# Patient Record
Sex: Female | Born: 1970 | Race: Black or African American | Hispanic: No | Marital: Married | Smoking: Never smoker
Health system: Southern US, Community
[De-identification: ages and names within clinical notes are randomized; demographics above are authoritative.]

---

## 2014-02-21 ENCOUNTER — Inpatient Hospital Stay (HOSPITAL_COMMUNITY): Payer: 59

## 2014-02-21 ENCOUNTER — Encounter (HOSPITAL_COMMUNITY): Payer: Self-pay | Admitting: Emergency Medicine

## 2014-02-21 ENCOUNTER — Emergency Department (HOSPITAL_COMMUNITY): Payer: 59

## 2014-02-21 ENCOUNTER — Inpatient Hospital Stay (HOSPITAL_COMMUNITY)
Admission: EM | Admit: 2014-02-21 | Discharge: 2014-03-21 | DRG: 004 | Disposition: A | Discharge status: Other Acute Inpatient Hospital | Payer: 59 | Attending: Pulmonary Disease | Admitting: Pulmonary Disease

## 2014-02-21 DIAGNOSIS — E872 Acidosis: Secondary | ICD-10-CM | POA: Diagnosis present

## 2014-02-21 DIAGNOSIS — R4182 Altered mental status, unspecified: Secondary | ICD-10-CM

## 2014-02-21 DIAGNOSIS — I672 Cerebral atherosclerosis: Secondary | ICD-10-CM | POA: Diagnosis present

## 2014-02-21 DIAGNOSIS — I610 Nontraumatic intracerebral hemorrhage in hemisphere, subcortical: Principal | ICD-10-CM

## 2014-02-21 DIAGNOSIS — H5702 Anisocoria: Secondary | ICD-10-CM

## 2014-02-21 DIAGNOSIS — Z992 Dependence on renal dialysis: Secondary | ICD-10-CM

## 2014-02-21 DIAGNOSIS — G936 Cerebral edema: Secondary | ICD-10-CM | POA: Diagnosis present

## 2014-02-21 DIAGNOSIS — I612 Nontraumatic intracerebral hemorrhage in hemisphere, unspecified: Secondary | ICD-10-CM

## 2014-02-21 DIAGNOSIS — J9601 Acute respiratory failure with hypoxia: Secondary | ICD-10-CM | POA: Diagnosis present

## 2014-02-21 DIAGNOSIS — R1310 Dysphagia, unspecified: Secondary | ICD-10-CM | POA: Diagnosis present

## 2014-02-21 DIAGNOSIS — F419 Anxiety disorder, unspecified: Secondary | ICD-10-CM | POA: Diagnosis present

## 2014-02-21 DIAGNOSIS — D62 Acute posthemorrhagic anemia: Secondary | ICD-10-CM | POA: Diagnosis present

## 2014-02-21 DIAGNOSIS — Z930 Tracheostomy status: Secondary | ICD-10-CM

## 2014-02-21 DIAGNOSIS — E46 Unspecified protein-calorie malnutrition: Secondary | ICD-10-CM | POA: Diagnosis present

## 2014-02-21 DIAGNOSIS — J398 Other specified diseases of upper respiratory tract: Secondary | ICD-10-CM

## 2014-02-21 DIAGNOSIS — I69398 Other sequelae of cerebral infarction: Secondary | ICD-10-CM

## 2014-02-21 DIAGNOSIS — T17990A Other foreign object in respiratory tract, part unspecified in causing asphyxiation, initial encounter: Secondary | ICD-10-CM | POA: Diagnosis not present

## 2014-02-21 DIAGNOSIS — Z9911 Dependence on respirator [ventilator] status: Secondary | ICD-10-CM | POA: Diagnosis not present

## 2014-02-21 DIAGNOSIS — I272 Other secondary pulmonary hypertension: Secondary | ICD-10-CM | POA: Diagnosis present

## 2014-02-21 DIAGNOSIS — I63519 Cerebral infarction due to unspecified occlusion or stenosis of unspecified middle cerebral artery: Secondary | ICD-10-CM | POA: Diagnosis present

## 2014-02-21 DIAGNOSIS — G8190 Hemiplegia, unspecified affecting unspecified side: Secondary | ICD-10-CM | POA: Diagnosis present

## 2014-02-21 DIAGNOSIS — Z515 Encounter for palliative care: Secondary | ICD-10-CM

## 2014-02-21 DIAGNOSIS — E1165 Type 2 diabetes mellitus with hyperglycemia: Secondary | ICD-10-CM | POA: Diagnosis present

## 2014-02-21 DIAGNOSIS — G9340 Encephalopathy, unspecified: Secondary | ICD-10-CM

## 2014-02-21 DIAGNOSIS — I501 Left ventricular failure: Secondary | ICD-10-CM

## 2014-02-21 DIAGNOSIS — J189 Pneumonia, unspecified organism: Secondary | ICD-10-CM | POA: Diagnosis present

## 2014-02-21 DIAGNOSIS — Z9289 Personal history of other medical treatment: Secondary | ICD-10-CM

## 2014-02-21 DIAGNOSIS — J9690 Respiratory failure, unspecified, unspecified whether with hypoxia or hypercapnia: Secondary | ICD-10-CM

## 2014-02-21 DIAGNOSIS — R4701 Aphasia: Secondary | ICD-10-CM | POA: Diagnosis present

## 2014-02-21 DIAGNOSIS — I161 Hypertensive emergency: Secondary | ICD-10-CM

## 2014-02-21 DIAGNOSIS — Z95828 Presence of other vascular implants and grafts: Secondary | ICD-10-CM

## 2014-02-21 DIAGNOSIS — N186 End stage renal disease: Secondary | ICD-10-CM | POA: Diagnosis present

## 2014-02-21 DIAGNOSIS — I120 Hypertensive chronic kidney disease with stage 5 chronic kidney disease or end stage renal disease: Secondary | ICD-10-CM | POA: Diagnosis present

## 2014-02-21 DIAGNOSIS — Z23 Encounter for immunization: Secondary | ICD-10-CM | POA: Diagnosis not present

## 2014-02-21 DIAGNOSIS — Z6841 Body Mass Index (BMI) 40.0 and over, adult: Secondary | ICD-10-CM

## 2014-02-21 DIAGNOSIS — N179 Acute kidney failure, unspecified: Secondary | ICD-10-CM | POA: Diagnosis present

## 2014-02-21 DIAGNOSIS — I677 Cerebral arteritis, not elsewhere classified: Secondary | ICD-10-CM

## 2014-02-21 DIAGNOSIS — I619 Nontraumatic intracerebral hemorrhage, unspecified: Secondary | ICD-10-CM | POA: Diagnosis present

## 2014-02-21 DIAGNOSIS — J811 Chronic pulmonary edema: Secondary | ICD-10-CM

## 2014-02-21 DIAGNOSIS — R4020 Unspecified coma: Secondary | ICD-10-CM

## 2014-02-21 DIAGNOSIS — R403 Persistent vegetative state: Secondary | ICD-10-CM | POA: Diagnosis present

## 2014-02-21 DIAGNOSIS — R531 Weakness: Secondary | ICD-10-CM

## 2014-02-21 DIAGNOSIS — I639 Cerebral infarction, unspecified: Secondary | ICD-10-CM | POA: Diagnosis present

## 2014-02-21 DIAGNOSIS — I5030 Unspecified diastolic (congestive) heart failure: Secondary | ICD-10-CM | POA: Diagnosis present

## 2014-02-21 DIAGNOSIS — J810 Acute pulmonary edema: Secondary | ICD-10-CM

## 2014-02-21 DIAGNOSIS — R509 Fever, unspecified: Secondary | ICD-10-CM

## 2014-02-21 DIAGNOSIS — Z4659 Encounter for fitting and adjustment of other gastrointestinal appliance and device: Secondary | ICD-10-CM

## 2014-02-21 DIAGNOSIS — R400 Somnolence: Secondary | ICD-10-CM

## 2014-02-21 LAB — COMPREHENSIVE METABOLIC PANEL
ALBUMIN: 3.7 g/dL (ref 3.5–5.2)
ALK PHOS: 152 U/L — AB (ref 39–117)
ALT: 30 U/L (ref 0–35)
AST: 25 U/L (ref 0–37)
Anion gap: 19 — ABNORMAL HIGH (ref 5–15)
BILIRUBIN TOTAL: 0.9 mg/dL (ref 0.3–1.2)
BUN: 42 mg/dL — ABNORMAL HIGH (ref 6–23)
CHLORIDE: 101 meq/L (ref 96–112)
CO2: 17 meq/L — AB (ref 19–32)
Calcium: 9.3 mg/dL (ref 8.4–10.5)
Creatinine, Ser: 3.58 mg/dL — ABNORMAL HIGH (ref 0.50–1.10)
GFR calc Af Amer: 17 mL/min — ABNORMAL LOW (ref >90)
GFR calc non Af Amer: 15 mL/min — ABNORMAL LOW (ref >90)
Glucose, Bld: 131 mg/dL — ABNORMAL HIGH (ref 70–99)
POTASSIUM: 3.6 meq/L — AB (ref 3.7–5.3)
Sodium: 137 mEq/L (ref 137–147)
Total Protein: 7.9 g/dL (ref 6.0–8.3)

## 2014-02-21 LAB — CBC WITH DIFFERENTIAL/PLATELET
BASOS ABS: 0.1 10*3/uL (ref 0.0–0.1)
BASOS PCT: 0 % (ref 0–1)
Eosinophils Absolute: 0.1 10*3/uL (ref 0.0–0.7)
Eosinophils Relative: 1 % (ref 0–5)
HCT: 35.2 % — ABNORMAL LOW (ref 36.0–46.0)
HEMOGLOBIN: 11.4 g/dL — AB (ref 12.0–15.0)
LYMPHS PCT: 9 % — AB (ref 12–46)
Lymphs Abs: 1.3 10*3/uL (ref 0.7–4.0)
MCH: 27.5 pg (ref 26.0–34.0)
MCHC: 32.4 g/dL (ref 30.0–36.0)
MCV: 85 fL (ref 78.0–100.0)
MONO ABS: 0.8 10*3/uL (ref 0.1–1.0)
MONOS PCT: 6 % (ref 3–12)
NEUTROS PCT: 84 % — AB (ref 43–77)
Neutro Abs: 12.4 10*3/uL — ABNORMAL HIGH (ref 1.7–7.7)
Platelets: 329 10*3/uL (ref 150–400)
RBC: 4.14 MIL/uL (ref 3.87–5.11)
RDW: 14.9 % (ref 11.5–15.5)
WBC: 14.6 10*3/uL — ABNORMAL HIGH (ref 4.0–10.5)

## 2014-02-21 LAB — POCT I-STAT 3, ART BLOOD GAS (G3+)
ACID-BASE DEFICIT: 8 mmol/L — AB (ref 0.0–2.0)
BICARBONATE: 17.8 meq/L — AB (ref 20.0–24.0)
O2 Saturation: 86 %
TCO2: 19 mmol/L (ref 0–100)
pCO2 arterial: 37.2 mmHg (ref 35.0–45.0)
pH, Arterial: 7.288 — ABNORMAL LOW (ref 7.350–7.450)
pO2, Arterial: 57 mmHg — ABNORMAL LOW (ref 80.0–100.0)

## 2014-02-21 LAB — PROTIME-INR
INR: 1.14 (ref 0.00–1.49)
Prothrombin Time: 14.6 seconds (ref 11.6–15.2)

## 2014-02-21 LAB — TROPONIN I: Troponin I: <0.3 ng/mL (ref <0.30)

## 2014-02-21 LAB — GLUCOSE, CAPILLARY: Glucose-Capillary: 166 mg/dL — ABNORMAL HIGH (ref 70–99)

## 2014-02-21 LAB — PRO B NATRIURETIC PEPTIDE: PRO B NATRI PEPTIDE: 36455 pg/mL — AB (ref 0–125)

## 2014-02-21 MED ORDER — NICARDIPINE HCL IN NACL 20-0.86 MG/200ML-% IV SOLN
5.0000 mg/h | Freq: Once | INTRAVENOUS | Status: AC
  Administered 2014-02-21: 5 mg/h via INTRAVENOUS
  Filled 2014-02-21: qty 200

## 2014-02-21 MED ORDER — FUROSEMIDE 10 MG/ML IJ SOLN
80.0000 mg | Freq: Once | INTRAMUSCULAR | Status: AC
  Administered 2014-02-21: 80 mg via INTRAVENOUS
  Filled 2014-02-21: qty 8

## 2014-02-21 MED ORDER — SENNOSIDES-DOCUSATE SODIUM 8.6-50 MG PO TABS
1.0000 | ORAL_TABLET | Freq: Two times a day (BID) | ORAL | Status: DC
  Administered 2014-02-23 (×2): 1 via ORAL
  Filled 2014-02-21 (×7): qty 1

## 2014-02-21 MED ORDER — INSULIN ASPART 100 UNIT/ML ~~LOC~~ SOLN
0.0000 [IU] | SUBCUTANEOUS | Status: DC
  Administered 2014-02-22: 3 [IU] via SUBCUTANEOUS
  Administered 2014-02-22 – 2014-02-25 (×5): 2 [IU] via SUBCUTANEOUS
  Administered 2014-02-25: 3 [IU] via SUBCUTANEOUS
  Administered 2014-02-25 (×3): 2 [IU] via SUBCUTANEOUS
  Administered 2014-02-26: 3 [IU] via SUBCUTANEOUS
  Administered 2014-02-26: 2 [IU] via SUBCUTANEOUS
  Administered 2014-02-26: 3 [IU] via SUBCUTANEOUS
  Administered 2014-02-26: 2 [IU] via SUBCUTANEOUS
  Administered 2014-02-26: 3 [IU] via SUBCUTANEOUS
  Administered 2014-02-26: 2 [IU] via SUBCUTANEOUS
  Administered 2014-02-27: 3 [IU] via SUBCUTANEOUS
  Administered 2014-02-27: 2 [IU] via SUBCUTANEOUS
  Administered 2014-02-27: 3 [IU] via SUBCUTANEOUS
  Administered 2014-02-27: 2 [IU] via SUBCUTANEOUS
  Administered 2014-02-28: 3 [IU] via SUBCUTANEOUS
  Administered 2014-02-28: 2 [IU] via SUBCUTANEOUS
  Administered 2014-02-28: 3 [IU] via SUBCUTANEOUS
  Administered 2014-02-28: 2 [IU] via SUBCUTANEOUS
  Administered 2014-02-28: 3 [IU] via SUBCUTANEOUS
  Administered 2014-02-28 – 2014-03-01 (×4): 2 [IU] via SUBCUTANEOUS
  Administered 2014-03-01: 3 [IU] via SUBCUTANEOUS
  Administered 2014-03-01 – 2014-03-02 (×3): 2 [IU] via SUBCUTANEOUS
  Administered 2014-03-02: 3 [IU] via SUBCUTANEOUS
  Administered 2014-03-02 – 2014-03-11 (×23): 2 [IU] via SUBCUTANEOUS
  Administered 2014-03-12: 3 [IU] via SUBCUTANEOUS
  Administered 2014-03-13 (×4): 2 [IU] via SUBCUTANEOUS
  Administered 2014-03-13: 3 [IU] via SUBCUTANEOUS
  Administered 2014-03-16 – 2014-03-17 (×5): 2 [IU] via SUBCUTANEOUS
  Administered 2014-03-18: 1 [IU] via SUBCUTANEOUS
  Administered 2014-03-18 – 2014-03-20 (×6): 2 [IU] via SUBCUTANEOUS

## 2014-02-21 MED ORDER — PANTOPRAZOLE SODIUM 40 MG IV SOLR
40.0000 mg | Freq: Every day | INTRAVENOUS | Status: DC
  Administered 2014-02-21 – 2014-02-23 (×3): 40 mg via INTRAVENOUS
  Filled 2014-02-21 (×5): qty 40

## 2014-02-21 MED ORDER — FENTANYL CITRATE 0.05 MG/ML IJ SOLN
50.0000 ug | INTRAMUSCULAR | Status: DC | PRN
  Administered 2014-02-21 – 2014-02-23 (×3): 50 ug via INTRAVENOUS
  Filled 2014-02-21 (×3): qty 2

## 2014-02-21 MED ORDER — FENTANYL CITRATE 0.05 MG/ML IJ SOLN
INTRAMUSCULAR | Status: AC
  Administered 2014-02-22: 100 ug via INTRAVENOUS
  Filled 2014-02-21: qty 2

## 2014-02-21 MED ORDER — LORAZEPAM 2 MG/ML IJ SOLN
0.5000 mg | Freq: Once | INTRAMUSCULAR | Status: AC
  Administered 2014-02-21: 0.5 mg via INTRAVENOUS

## 2014-02-21 MED ORDER — FUROSEMIDE 10 MG/ML IJ SOLN
80.0000 mg | Freq: Once | INTRAMUSCULAR | Status: AC
  Administered 2014-02-21: 80 mg via INTRAVENOUS

## 2014-02-21 MED ORDER — PROPOFOL 10 MG/ML IV EMUL
0.0000 ug/kg/min | INTRAVENOUS | Status: AC
  Administered 2014-02-22 (×2): 10 ug/kg/min via INTRAVENOUS
  Administered 2014-02-22 – 2014-02-23 (×3): 20 ug/kg/min via INTRAVENOUS
  Filled 2014-02-21 (×5): qty 100

## 2014-02-21 MED ORDER — NITROGLYCERIN 2 % TD OINT
1.0000 [in_us] | TOPICAL_OINTMENT | Freq: Once | TRANSDERMAL | Status: AC
Start: 2014-02-21 — End: 2014-02-21
  Administered 2014-02-21: 1 [in_us] via TOPICAL
  Filled 2014-02-21: qty 1

## 2014-02-21 MED ORDER — ACETAMINOPHEN 325 MG PO TABS: 650.0000 mg | ORAL_TABLET | ORAL | Status: DC | PRN

## 2014-02-21 MED ORDER — NICARDIPINE HCL IN NACL 20-0.86 MG/200ML-% IV SOLN
3.0000 mg/h | INTRAVENOUS | Status: DC
  Administered 2014-02-21: 7.5 mg/h via INTRAVENOUS
  Filled 2014-02-21: qty 200

## 2014-02-21 MED ORDER — ALBUTEROL SULFATE (2.5 MG/3ML) 0.083% IN NEBU: 2.5000 mg | INHALATION_SOLUTION | Freq: Once | RESPIRATORY_TRACT | Status: DC

## 2014-02-21 MED ORDER — IPRATROPIUM-ALBUTEROL 0.5-2.5 (3) MG/3ML IN SOLN: 3.0000 mL | Freq: Once | RESPIRATORY_TRACT | Status: DC

## 2014-02-21 MED ORDER — FUROSEMIDE 10 MG/ML IJ SOLN
80.0000 mg | Freq: Once | INTRAMUSCULAR | Status: DC
  Filled 2014-02-21: qty 8

## 2014-02-21 MED ORDER — DEXTROSE 5 % IV SOLN
160.0000 mg | Freq: Three times a day (TID) | INTRAVENOUS | Status: DC
  Administered 2014-02-22 (×2): 160 mg via INTRAVENOUS
  Filled 2014-02-21 (×4): qty 16

## 2014-02-21 MED ORDER — ACETAMINOPHEN 650 MG RE SUPP: 650.0000 mg | RECTAL | Status: DC | PRN

## 2014-02-21 MED ORDER — MIDAZOLAM HCL 2 MG/2ML IJ SOLN
INTRAMUSCULAR | Status: AC
  Administered 2014-02-22: 2 mg
  Filled 2014-02-21: qty 2

## 2014-02-21 MED ORDER — LORAZEPAM 2 MG/ML IJ SOLN
INTRAMUSCULAR | Status: AC
  Filled 2014-02-21: qty 1

## 2014-02-21 MED ORDER — STROKE: EARLY STAGES OF RECOVERY BOOK
Freq: Once | Status: AC
  Administered 2014-02-21: 22:00:00
  Filled 2014-02-21: qty 1

## 2014-02-21 MED ORDER — SODIUM CHLORIDE 0.9 % IV SOLN: INTRAVENOUS | Status: DC

## 2014-02-21 MED ORDER — LORAZEPAM 2 MG/ML IJ SOLN
INTRAMUSCULAR | Status: AC
  Administered 2014-02-21: 0.5 mg via INTRAVENOUS
  Filled 2014-02-21: qty 1

## 2014-02-21 NOTE — Procedures (Signed)
Intubation Procedure Note Lynn Gentry [id number] 1971/04/09  Procedure: Intubation Indications: Respiratory insufficiency  Procedure Details Consent: Unable to obtain consent because of altered level of consciousness. and EMERGENCY Time Out: Verified patient identification, verified procedure, site/side was marked, verified correct patient position, special equipment/implants available, medications/allergies/relevent history reviewed, required imaging and test results available.  Performed  Maximum sterile technique was used including cap, gloves, gown, hand hygiene and mask.  MAC    Evaluation Hemodynamic Status: BP stable throughout; O2 sats: peristently between 78 and 85% due to pulmonary edema Patient's Current Condition: stable Complications: No apparent complications Patient did tolerate procedure well. Chest X-ray ordered to verify placement.  CXR: pending.   Lynn Gentry 02/21/2014   Procedure done by PA Celine Mans with my supervision at bedside. She has small mouth and is morbidly obese with acute resp failure due to pulm edema. Despiet nasal trumpet and bagging with sedation, pulse ox never went abuve 89%. She needed paralytic to be able to open her mouth . Intubated under glidescope. Copious pulmonary edema +   Dr. Kalman Shan, M.D., [facility].C.P Pulmonary and Critical Care Medicine Staff Physician [facility] Pager: [phone], If no answer or between  15:00h - 7:00h: call [phone]  02/21/2014 10:52 PM

## 2014-02-21 NOTE — ED Provider Notes (Signed)
CSN: [id number]     Arrival date & time 02/21/14  1757 History  This chart was scribed for Benny Lennert, MD by Bronson Curb, ED Scribe. This patient was seen in room APA05/APA05 and the patient's care was started at 6:01 PM.    Chief Complaint  Patient presents with  . Cerebrovascular Accident      Patient is a 43 y.o. female presenting with Acute Neurological Problem. The history is provided by the patient. No language interpreter was used.  Cerebrovascular Accident This is a new problem. The current episode started 3 to 5 hours ago. The problem has been gradually improving. Pertinent negatives include no chest pain, no abdominal pain, no headaches and no shortness of breath. Nothing aggravates the symptoms. Nothing relieves the symptoms. She has tried nothing for the symptoms.    HPI Comments: Lynn Gentry is a 43 y.o. female brought in by ambulance, who presents to the Emergency Department for CVA that occurred 3 hours ago. Per EMS patient was asymptomatic prior to onset and states she was found by her son at approximately 1600, where she was unable to stand up from chair. EMS reports associated left sided weakness, slurred speech, along with a BP of 200/100 (current BP 226/111). Patient denies any pain at present. Patient is not established with a PCP, and has no history of significant medical conditions.     No past medical history on file. No past surgical history on file. No family history on file. History  Substance Use Topics  . Smoking status: Not on file  . Smokeless tobacco: Not on file  . Alcohol Use: Not on file   OB History   No data available     Review of Systems  Constitutional: Negative for appetite change and fatigue.  HENT: Negative for congestion, ear discharge and sinus pressure.   Eyes: Negative for discharge.  Respiratory: Negative for cough and shortness of breath.   Cardiovascular: Negative for chest pain.  Gastrointestinal: Negative for  abdominal pain and diarrhea.  Genitourinary: Negative for frequency and hematuria.  Musculoskeletal: Negative for back pain.  Skin: Negative for rash.  Neurological: Positive for weakness (left sided). Negative for seizures and headaches.  Psychiatric/Behavioral: Negative for hallucinations.      Allergies  Review of patient's allergies indicates not on file.  Home Medications   Prior to Admission medications   Not on File   BP 226/111  Pulse 113  Resp 28  SpO2 94% Physical Exam  Constitutional: She is oriented to person, place, and time. She appears well-developed.  HENT:  Head: Normocephalic.  Eyes: Conjunctivae and EOM are normal. No scleral icterus.  Neck: Neck supple. No thyromegaly present.  Cardiovascular: Normal rate and regular rhythm.  Exam reveals no gallop and no friction rub.   No murmur heard. Pulmonary/Chest: No stridor. She has no wheezes. She has no rales. She exhibits no tenderness.  Abdominal: She exhibits no distension. There is no tenderness. There is no rebound.  Musculoskeletal: Normal range of motion. She exhibits no edema.  Lymphadenopathy:    She has no cervical adenopathy.  Neurological: She is oriented to person, place, and time. She exhibits normal muscle tone. Coordination normal.  Severe arm left arm and left leg weakness with left facial droop.  Skin: No rash noted. No erythema.  Psychiatric: She has a normal mood and affect. Her behavior is normal.    ED Course  Procedures (including critical care time)  DIAGNOSTIC STUDIES: Oxygen Saturation is 94% on  [place], adequate by my interpretation.    COORDINATION OF CARE: At 1805 Discussed treatment plan with patient which includes CT scan of head, CMP, and nicardipine. Patient agrees.   Labs Review Labs Reviewed - No data to display  Imaging Review Ct Head Wo Contrast  02/21/2014   CLINICAL DATA:  Patient found unresponsive at home by her son at approximately 1600 hr this afternoon. Code  stroke.  EXAM: CT HEAD WITHOUT CONTRAST  TECHNIQUE: Contiguous axial images were obtained from the base of the skull through the vertex without intravenous contrast.  COMPARISON:  None.  FINDINGS: Oval-shaped acute hemorrhage in the lateral right basal ganglia measuring approximately 4.1 x 1.5 cm (image 15). Mild surrounding edema, extending upward into the posterior right frontal lobe. Mild mass effect with shift of the midline to the left approximating 5 mm. No acute hemorrhage or hematoma elsewhere. No evidence of hydrocephalus. No other focal brain parenchymal abnormalities.  No skull fracture or other focal osseous abnormality involving the skull. Visualized paranasal sinuses, bilateral mastoid air cells and bilateral middle ear cavities well-aerated.  IMPRESSION: 1. Acute hemorrhage into the lateral right basal ganglia, likely indicating a hypertensive hemorrhage/hemorrhagic stroke. 2. Mild mass effect with shift of the midline to the left approximating 5 mm. Critical Value/emergent results were called by telephone at the time of interpretation on 02/21/2014 at 6:27 pm to Dr. Bethann BerkshireJOSEPH Miyah Hampshire , who verbally acknowledged these results.   Electronically Signed   By: Hulan Saashomas  Lawrence M.D.   On: 02/21/2014 18:29      EKG Interpretation None     CRITICAL CARE Performed by: Carnita Golob L Total critical care time: 40 Critical care time was exclusive of separately billable procedures and treating other patients. Critical care was necessary to treat or prevent imminent or life-threatening deterioration. Critical care was time spent personally by me on the following activities: development of treatment plan with patient and/or surrogate as well as nursing, discussions with consultants, evaluation of patient's response to treatment, examination of patient, obtaining history from patient or surrogate, ordering and performing treatments and interventions, ordering and review of laboratory studies, ordering and  review of radiographic studies, pulse oximetry and re-evaluation of patient's condition.   spoke with neurology who will accept pt to [facility]   Final diagnoses:  None    The chart was scribed for me under my direct supervision.  I personally performed the history, physical, and medical decision making and all procedures in the evaluation of this patient.Benny Lennert.   Lacey Wallman L Carolos Fecher, MD 02/21/14 [phone]

## 2014-02-21 NOTE — Consult Note (Signed)
PULMONARY / CRITICAL CARE MEDICINE   Name: Lynn Gentry MRN: [id number] DOB: February 11, 1971    ADMISSION DATE:  02/21/2014 CONSULTATION DATE:  02/21/2014  REFERRING MD :  Lynn Gentry  CHIEF COMPLAINT:  Respiratory failure in setting of ICH  INITIAL PRESENTATION: 43 y.o. F brought to [facility] ED on 10/6 with left sided weakness, slurred speech, inability to stand up.  In ED, found to have acute lateral right basal ganglia ICH with mild mass effect and 5mm MLS.  She was admitted to neuro ICU and later that evening had increasing O2 demands.  She had no improvement in SpO2, work of breathing, and mental status despite 3 hours of BiPAP.  PCCM was consulted for intubation.   STUDIES:  CT Head10/6 >>> acute hemorrhage into the lateral right basal ganglia, likely indicating a hypertensive hemorrhage/hemorrhagic stroke.  Mild mass effect with 5mm MLS to the left.  SIGNIFICANT EVENTS: 10/6 - admitted for ICH, developed respiratory failure requiring intubation.   HISTORY OF PRESENT ILLNESS:  Pt is encephalopathic; therefore, this HPI is obtained from chart review. Lynn Gentry is a 43 y.o. F with unknown PMH, brought to [facility] ED via EMS on 10/6 after her son found her at home unable to stand up, with left sided weakness, and slurred speech.  She was seen in ED roughly 3 hours later and was admitted to neuro ICU for medical management.  She was initially placed on [place], then NRB, and eventually BiPAP.  After 3 hours of BiPAP, respiratory status continued to decline.  PCCM was consulted for intubation.for acute respiratory failure 02/21/2014    PAST MEDICAL HISTORY :  History reviewed. No pertinent past medical history. History reviewed. No pertinent past surgical history. Prior to Admission medications   Medication Sig Start Date End Date Taking? Authorizing Provider  cetirizine (ZYRTEC) 10 MG tablet Take 10 mg by mouth daily as needed for allergies.   Yes Historical Provider, MD   No Known Allergies  FAMILY  HISTORY:  No family history on file. SOCIAL HISTORY:  reports that she has never smoked. She does not have any smokeless tobacco history on file. She reports that she drinks alcohol. She reports that she does not use illicit drugs.  REVIEW OF SYSTEMS:  Unable to obtain as pt is encephalopathic.    VITAL SIGNS: Pulse Rate:  [113-121] 115 (10/06 2130) Resp:  [25-49] 32 (10/06 2130) BP: (140-226)/(69-111) 150/82 mmHg (10/06 2130) SpO2:  [87 %-100 %] 96 % (10/06 2130) FiO2 (%):  [100 %] 100 % (10/06 2130) HEMODYNAMICS:   VENTILATOR SETTINGS: Vent Mode:  [-] BIPAP FiO2 (%):  [100 %] 100 % INTAKE / OUTPUT: Intake/Output     10/06 0701 - 10/07 0700   Urine 175   Total Output 175   Net -175         PHYSICAL EXAMINATION: General: Obese female, in severe respi distress and critically ill looking Neuro: Does not answer questions or follow commands. Obtunded (per RN was oriented earlier and alert) HEENT: [place]/AT. PERRL, sclerae anicteric. BiPAP in place. Cardiovascular: RRR, no M/R/G.  Lungs: Respirations shallow and labored, paradoxical, severe resp disterss, crackles throughout.  On BiPAP. Abdomen: BS hypoactive, soft, NT/ND.  Musculoskeletal: No gross deformities, 1+edema.  Skin: Intact, warm, no rashes.  LABS: PULMONARY  Recent Labs Lab 02/21/14 2151  PHART 7.288*  PCO2ART 37.2  PO2ART 57.0*  HCO3 17.8*  TCO2 19  O2SAT 86.0    CBC  Recent Labs Lab 02/21/14 1900  HGB 11.4*  HCT 35.2*  WBC 14.6*  PLT 329    COAGULATION  Recent Labs Lab 02/21/14 1900  INR 1.14    CARDIAC   Recent Labs Lab 02/21/14 1900  TROPONINI <0.30    Recent Labs Lab 02/21/14 1900  PROBNP 36455.0*     CHEMISTRY  Recent Labs Lab 02/21/14 1900  NA 137  K 3.6*  CL 101  CO2 17*  GLUCOSE 131*  BUN 42*  CREATININE 3.58*  CALCIUM 9.3   Estimated Creatinine Clearance: 26.5 ml/min (by C-G formula based on Cr of 3.58).   LIVER  Recent Labs Lab 02/21/14 1900   AST 25  ALT 30  ALKPHOS 152*  BILITOT 0.9  PROT 7.9  ALBUMIN 3.7  INR 1.14     INFECTIOUS No results found for this basename: LATICACIDVEN, PROCALCITON,  in the last 168 hours   ENDOCRINE CBG (last 3)  No results found for this basename: GLUCAP,  in the last 72 hours       IMAGING x48h Ct Head Wo Contrast  02/21/2014   CLINICAL DATA:  Patient found unresponsive at home by her son at approximately 1600 hr this afternoon. Code stroke.  EXAM: CT HEAD WITHOUT CONTRAST  TECHNIQUE: Contiguous axial images were obtained from the base of the skull through the vertex without intravenous contrast.  COMPARISON:  None.  FINDINGS: Oval-shaped acute hemorrhage in the lateral right basal ganglia measuring approximately 4.1 x 1.5 cm (image 15). Mild surrounding edema, extending upward into the posterior right frontal lobe. Mild mass effect with shift of the midline to the left approximating 5 mm. No acute hemorrhage or hematoma elsewhere. No evidence of hydrocephalus. No other focal brain parenchymal abnormalities.  No skull fracture or other focal osseous abnormality involving the skull. Visualized paranasal sinuses, bilateral mastoid air cells and bilateral middle ear cavities well-aerated.  IMPRESSION: 1. Acute hemorrhage into the lateral right basal ganglia, likely indicating a hypertensive hemorrhage/hemorrhagic stroke. 2. Mild mass effect with shift of the midline to the left approximating 5 mm. Critical Value/emergent results were called by telephone at the time of interpretation on 02/21/2014 at 6:27 pm to Dr. Bethann Gentry , who verbally acknowledged these results.   Electronically Signed   By: Lynn Gentry M.D.   On: 02/21/2014 18:29   Dg Chest Portable 1 View  02/21/2014   CLINICAL DATA:  Cerebrovascular accident beginning 3-4 hr ago. Confusion.  EXAM: PORTABLE CHEST - 1 VIEW  COMPARISON:  None.  FINDINGS: Exam is limited by body habitus and underpenetration.  The heart is mildly  enlarged. Asymmetric airspace process, right greater than left. Possible asymmetric pulmonary edema without definite pleural effusions.  IMPRESSION: Mild cardiac enlargement and possible asymmetric pulmonary edema. However, exam is limited by body habitus.   Electronically Signed   By: Loralie Champagne M.D.   On: 02/21/2014 19:38      ASSESSMENT / PLAN:  PULMONARY OETT 10/6 >>> A: Acute hypoxemic respiratory failure with severe acute pulmonary edema - in setting of right lateral basal ganglia ICH  - very hypoxemic    P:   Intubate stat Full mechanical support, wean as able. VAP bundle. SBT in AM. diurese. ABG and CXR in AM.  CARDIOVASCULAR CVL R IJ 10/6 >>> A:  Admitted with severe hypertension > 220 - 0n cardene gtt  Severe pulmonary edema - BNP 36K - Acute systolic v diastolic chf P:  Aggressive lasix Goal MAP > 65 but < 160 sbp; likely Wean off cardene post intubation with diprivan on board Goal  CVP 8 - 12. Trend troponin / lactate. TTE.   RENAL A:   Acute renal failure - baseline creat uknown  P:   BMP in AM. Check renal us Check UA   GASTROINTESTINAL A:   Intubated  P:   SUP: Pantoprazole. NPO. TF if remains NPO > 24 hours; consulted nutrition  HEMATOLOGIC A:   At risk for anemia of critical illness  P:  SCD's in setting of IC Hge. No heparin CBC in AM.  INFECTIOUS A:   No evidence of infection P:   mopnitor without abx  ENDOCRINE A:   At risk for hyperglycemia    P:   CBG's q4hr. SSI.  NEUROLOGIC A:   Intracranial hemorrhage with enceophaloapathy, ? Deterioration of mental status just prior to intubation  P:   Repeat head CT stat Sedation:  diprivan RASS goal: 0 to -1. Daily WUA.  FAMILY Family updates: No family at bedside  IDT family conference: N/a   TODAY'S SUMMARY: ICHge due to high bP. Also with pulm edem and renal failure resulting in intubation. Very hypoxemic. Note entirely done by Staff MD though was opened by  PA    The patient is critically ill with multiple organ systems failure and requires high complexity decision making for assessment and support, frequent evaluation and titration of therapies, application of advanced monitoring technologies and extensive interpretation of multiple databases.   Critical Care Time devoted to patient care services described in this note is  45  Minutes.  Dr. Kalman ShanMurali Dray Dente, M.D., [facility].C.P Pulmonary and Critical Care Medicine Staff Physician [facility] Pager: [phone], If no answer or between  15:00h - 7:00h: call [phone]  02/21/2014 11:15 PM

## 2014-02-21 NOTE — ED Notes (Signed)
Per EMS and spouse. States pt work today until 1500. States she was found by her son at 65 when he arrived home.

## 2014-02-21 NOTE — H&P (Signed)
Admission H&P    Chief Complaint: Acute onset this weakness and slurred speech.  HPI: Mattia Osterman is an 43 y.o. female with no previously documented medical disorder who was brought to [facility] with new onset slurred speech and left-sided weakness. Patient was last known well around 3 PM this afternoon. CT scan of her head showed an acute 4.1 x 1.5 cm right basal ganglia hemorrhage with mild mass effect and right to left shift of 5 mm. Patient's blood pressure was markedly elevated at 226/111. She was started on Cardene drip and blood pressure responded well. She has shown continual respiratory difficulty. She was initially given oxygen by nasal cannula followed by NBR, and subsequently placed on BiPAP. She continued to have oxygen saturations only in the high 80s to low 90s on 100% O2, with a respiratory rates in the 40s. ABG showed pH of 2.82, PCO2 37 PO2 of 57. Chest x-ray showed mild cardiomegaly with possible asymmetric pulmonary edema. BNP was 36,455. Because of deteriorating respiratory status intubation and mechanical ventilation was elected. CCM service was consulted.  LSN: 3 PM on 02/21/2014 tPA Given: No: Acute ICH MRankin:    History reviewed. No pertinent past medical history.  History reviewed. No pertinent past surgical history.  No family history on file. Social History:  reports that she has never smoked. She does not have any smokeless tobacco history on file. She reports that she drinks alcohol. She reports that she does not use illicit drugs.  Allergies: No Known Allergies  Medications Prior to Admission  Medication Sig Dispense Refill  . cetirizine (ZYRTEC) 10 MG tablet Take 10 mg by mouth daily as needed for allergies.        ROS: History obtained from unobtainable from patient due to mental status   Physical Examination: Blood pressure 150/82, pulse 115, resp. rate 32, height _0  (1.626 m), SpO2 96.00%.  Gen. appearance: Markedly obese young to  early middle-aged appearing lady in respiratory distress with tachypnea as well as moderately agitated and confused. HEENT-  Normocephalic, no lesions, without obvious abnormality.  Normal external eye and conjunctiva.  Normal TM's bilaterally.  Normal auditory canals and external ears. Normal external nose, mucus membranes and septum.  Normal pharynx. Neck supple with no masses, nodes, nodules or enlargement. Cardiovascular - tachycardic with rate of 114, regular rhythm, normal S1 and S2. Lungs - moderate diffuse rhonchi bilaterally gallop, with tachypnea of 40-45 breaths per minute Abdomen - soft, non-tender; bowel sounds normal; no masses,  no organomegaly Extremities - no joint deformities, effusion, or inflammation, no edema and no skin discoloration  Neurologic Examination: Patient was on BiPAP and slightly agitated, having been given Ativan prior to this evaluation. She had no clear verbal responses. She only infrequently followed simple commands. She was clearly in significant respiratory distress with tachypnea. Pupils were equal and reacted normally to light. Extraocular movements were intact with right left lateral gaze voluntarily. No clear facial weakness was noted. Patient moved right extremities readily with significantly reduced movements of left extremities. She had no clear purposeful movements other than attempting to remove lines and tubes. Deep tendon reflexes were 1+ and symmetrical. Plantar responses were mute bilaterally. Sensory testing responses were unreliable. Coordination could not be adequately assessed because patient mental status.  Results for orders placed during the hospital encounter of 02/21/14 (from the past 48 hour(s))  CBC WITH DIFFERENTIAL     Status: Abnormal   Collection Time    02/21/14  7:00 PM  Result Value Ref Range   WBC 14.6 (*) 4.0 - 10.5 K/uL   RBC 4.14  3.87 - 5.11 MIL/uL   Hemoglobin 11.4 (*) 12.0 - 15.0 g/dL   HCT 35.2 (*) 36.0 -  46.0 %   MCV 85.0  78.0 - 100.0 fL   MCH 27.5  26.0 - 34.0 pg   MCHC 32.4  30.0 - 36.0 g/dL   RDW 14.9  11.5 - 15.5 %   Platelets 329  150 - 400 K/uL   Neutrophils Relative % 84 (*) 43 - 77 %   Neutro Abs 12.4 (*) 1.7 - 7.7 K/uL   Lymphocytes Relative 9 (*) 12 - 46 %   Lymphs Abs 1.3  0.7 - 4.0 K/uL   Monocytes Relative 6  3 - 12 %   Monocytes Absolute 0.8  0.1 - 1.0 K/uL   Eosinophils Relative 1  0 - 5 %   Eosinophils Absolute 0.1  0.0 - 0.7 K/uL   Basophils Relative 0  0 - 1 %   Basophils Absolute 0.1  0.0 - 0.1 K/uL  COMPREHENSIVE METABOLIC PANEL     Status: Abnormal   Collection Time    02/21/14  7:00 PM      Result Value Ref Range   Sodium 137  137 - 147 mEq/L   Potassium 3.6 (*) 3.7 - 5.3 mEq/L   Chloride 101  96 - 112 mEq/L   CO2 17 (*) 19 - 32 mEq/L   Glucose, Bld 131 (*) 70 - 99 mg/dL   BUN 42 (*) 6 - 23 mg/dL   Creatinine, Ser 3.58 (*) 0.50 - 1.10 mg/dL   Calcium 9.3  8.4 - 10.5 mg/dL   Total Protein 7.9  6.0 - 8.3 g/dL   Albumin 3.7  3.5 - 5.2 g/dL   AST 25  0 - 37 U/L   ALT 30  0 - 35 U/L   Alkaline Phosphatase 152 (*) 39 - 117 U/L   Total Bilirubin 0.9  0.3 - 1.2 mg/dL   GFR calc non Af Amer 15 (*) >90 mL/min   GFR calc Af Amer 17 (*) >90 mL/min   Comment: (NOTE)     The eGFR has been calculated using the CKD EPI equation.     This calculation has not been validated in all clinical situations.     eGFR's persistently <90 mL/min signify possible Chronic Kidney     Disease.   Anion gap 19 (*) 5 - 15  PROTIME-INR     Status: None   Collection Time    02/21/14  7:00 PM      Result Value Ref Range   Prothrombin Time 14.6  11.6 - 15.2 seconds   INR 1.14  0.00 - 1.49  TROPONIN I     Status: None   Collection Time    02/21/14  7:00 PM      Result Value Ref Range   Troponin I <0.30  <0.30 ng/mL   Comment:            Due to the release kinetics of cTnI,     a negative result within the first hours     of the onset of symptoms does not rule out      myocardial infarction with certainty.     If myocardial infarction is still suspected,     repeat the test at appropriate intervals.  PRO B NATRIURETIC PEPTIDE     Status: Abnormal   Collection Time  02/21/14  7:00 PM      Result Value Ref Range   Pro B Natriuretic peptide (BNP) 36455.0 (*) 0 - 125 pg/mL  POCT I-STAT 3, ART BLOOD GAS (G3+)     Status: Abnormal   Collection Time    02/21/14  9:51 PM      Result Value Ref Range   pH, Arterial 7.288 (*) 7.350 - 7.450   pCO2 arterial 37.2  35.0 - 45.0 mmHg   pO2, Arterial 57.0 (*) 80.0 - 100.0 mmHg   Bicarbonate 17.8 (*) 20.0 - 24.0 mEq/L   TCO2 19  0 - 100 mmol/L   O2 Saturation 86.0     Acid-base deficit 8.0 (*) 0.0 - 2.0 mmol/L   Patient temperature 98.3 F     Collection site RADIAL, ALLEN'S TEST ACCEPTABLE     Drawn by RT     Sample type ARTERIAL     Ct Head Wo Contrast  02/21/2014   CLINICAL DATA:  Patient found unresponsive at home by her son at approximately 1600 hr this afternoon. Code stroke.  EXAM: CT HEAD WITHOUT CONTRAST  TECHNIQUE: Contiguous axial images were obtained from the base of the skull through the vertex without intravenous contrast.  COMPARISON:  None.  FINDINGS: Oval-shaped acute hemorrhage in the lateral right basal ganglia measuring approximately 4.1 x 1.5 cm (image 15). Mild surrounding edema, extending upward into the posterior right frontal lobe. Mild mass effect with shift of the midline to the left approximating 5 mm. No acute hemorrhage or hematoma elsewhere. No evidence of hydrocephalus. No other focal brain parenchymal abnormalities.  No skull fracture or other focal osseous abnormality involving the skull. Visualized paranasal sinuses, bilateral mastoid air cells and bilateral middle ear cavities well-aerated.  IMPRESSION: 1. Acute hemorrhage into the lateral right basal ganglia, likely indicating a hypertensive hemorrhage/hemorrhagic stroke. 2. Mild mass effect with shift of the midline to the left  approximating 5 mm. Critical Value/emergent results were called by telephone at the time of interpretation on 02/21/2014 at 6:27 pm to Dr. Milton Ferguson , who verbally acknowledged these results.   Electronically Signed   By: Evangeline Dakin M.D.   On: 02/21/2014 18:29   Dg Chest Portable 1 View  02/21/2014   CLINICAL DATA:  Cerebrovascular accident beginning 3-4 hr ago. Confusion.  EXAM: PORTABLE CHEST - 1 VIEW  COMPARISON:  None.  FINDINGS: Exam is limited by body habitus and underpenetration.  The heart is mildly enlarged. Asymmetric airspace process, right greater than left. Possible asymmetric pulmonary edema without definite pleural effusions.  IMPRESSION: Mild cardiac enlargement and possible asymmetric pulmonary edema. However, exam is limited by body habitus.   Electronically Signed   By: Kalman Jewels M.D.   On: 02/21/2014 19:38    Assessment: 42 y.o. female presenting with hypertensive emergency and acute right intracerebral hemorrhage, as well as acute progressive respiratory failure requiring intubation at this point.  Stroke Risk Factors - hypertension  Plan: 1. HgbA1c, fasting lipid panel 2. MRI, MRA  of the brain without contrast 3. PT consult, OT consult, Speech consult 4. Echocardiogram 5. Carotid dopplers 6. Prophylactic therapy-Nonettt 7. Risk factor modification 8. Telemetry monitoring  This patient was evaluated and managed for multiple acute medical problems, including ICH, hypertensive emergency and respiratory failure, in an ICU setting. Total critical care time was 90 minutes.   C.R. Nicole Kindred, MD [facility] [phone]  02/21/2014, 10:18 PM

## 2014-02-21 NOTE — Procedures (Signed)
Staff note   - personally supervised procedure @ bedside. Real time 2D ultrasound used for vein site selection, patency assessment, and needle entry. A record of image was made but could not be submitted for filing due to malfunction of printing device   Dr. Kalman Shan, M.D., [facility].C.P Pulmonary and Critical Care Medicine Staff Physician [facility]  Pulmonary and Critical Care Pager: [phone], If no answer or between  15:00h - 7:00h: call [phone]  02/21/2014 11:21 PM

## 2014-02-21 NOTE — ED Notes (Signed)
Pt sats 87% on 6L [place], pt switched to NRB at 15L.  sats now 90%

## 2014-02-21 NOTE — ED Notes (Signed)
Verbal orders were just  Given for a Code Stroke. Paged CT.

## 2014-02-21 NOTE — ED Notes (Signed)
Called [facility] as requested by Dr. Estell Harpin, instead of [facility].

## 2014-02-21 NOTE — Procedures (Signed)
Central Venous Catheter Insertion Procedure Note Lynn Gentry [id number] 20-Jun-1970  Procedure: Insertion of Central Venous Catheter Indications: Assessment of intravascular volume, Drug and/or fluid administration and Frequent blood sampling  Procedure Details Consent: Unable to obtain consent because of altered level of consciousness. Time Out: Verified patient identification, verified procedure, site/side was marked, verified correct patient position, special equipment/implants available, medications/allergies/relevent history reviewed, required imaging and test results available.  Performed  Maximum sterile technique was used including antiseptics, cap, gloves, gown, hand hygiene, mask and sheet. Skin prep: Chlorhexidine; local anesthetic administered A antimicrobial bonded/coated triple lumen catheter was placed in the right internal jugular vein using the Seldinger technique.  Evaluation Blood flow good Complications: No apparent complications Patient did tolerate procedure well. Chest X-ray ordered to verify placement.  CXR: pending.  Procedure performed under direct ultrasound guidance for real time vessel cannulation.      Rutherford Guys, PA - C [place] [facility]monary & Critical Care Medicine Pgr: [phone]  or [phone]

## 2014-02-22 ENCOUNTER — Inpatient Hospital Stay (HOSPITAL_COMMUNITY): Payer: 59

## 2014-02-22 ENCOUNTER — Encounter (HOSPITAL_COMMUNITY): Payer: Self-pay | Admitting: Radiology

## 2014-02-22 DIAGNOSIS — I10 Essential (primary) hypertension: Secondary | ICD-10-CM

## 2014-02-22 DIAGNOSIS — I161 Hypertensive emergency: Secondary | ICD-10-CM

## 2014-02-22 DIAGNOSIS — I319 Disease of pericardium, unspecified: Secondary | ICD-10-CM

## 2014-02-22 LAB — BLOOD GAS, ARTERIAL
Acid-base deficit: 8.8 mmol/L — ABNORMAL HIGH (ref 0.0–2.0)
Acid-base deficit: 7.6 mmol/L — ABNORMAL HIGH (ref 0.0–2.0)
BICARBONATE: 16.9 meq/L — AB (ref 20.0–24.0)
BICARBONATE: 17.5 meq/L — AB (ref 20.0–24.0)
DRAWN BY: 331761
Drawn by: 252031
FIO2: 1 %
FIO2: 0.9 %
LHR: 18 {breaths}/min
MECHVT: 440 mL
O2 SAT: 99.1 %
O2 Saturation: 96.8 %
PEEP/CPAP: 14 cmH2O
PEEP/CPAP: 5 cmH2O
PH ART: 7.265 — AB (ref 7.350–7.450)
PH ART: 7.316 — AB (ref 7.350–7.450)
PO2 ART: 151 mmHg — AB (ref 80.0–100.0)
Patient temperature: 98.6
Patient temperature: 97.8
RATE: 18 resp/min
TCO2: 18.1 mmol/L (ref 0–100)
TCO2: 18.6 mmol/L (ref 0–100)
VT: 440 mL
pCO2 arterial: 38.6 mmHg (ref 35.0–45.0)
pCO2 arterial: 35.1 mmHg (ref 35.0–45.0)
pO2, Arterial: 110 mmHg — ABNORMAL HIGH (ref 80.0–100.0)

## 2014-02-22 LAB — BASIC METABOLIC PANEL
Anion gap: 17 — ABNORMAL HIGH (ref 5–15)
BUN: 43 mg/dL — AB (ref 6–23)
CO2: 20 mEq/L (ref 19–32)
CREATININE: 4.14 mg/dL — AB (ref 0.50–1.10)
Calcium: 8.5 mg/dL (ref 8.4–10.5)
Chloride: 103 mEq/L (ref 96–112)
GFR calc Af Amer: 14 mL/min — ABNORMAL LOW (ref >90)
GFR calc non Af Amer: 12 mL/min — ABNORMAL LOW (ref >90)
Glucose, Bld: 160 mg/dL — ABNORMAL HIGH (ref 70–99)
Potassium: 4 mEq/L (ref 3.7–5.3)
Sodium: 140 mEq/L (ref 137–147)

## 2014-02-22 LAB — GLUCOSE, CAPILLARY
GLUCOSE-CAPILLARY: 112 mg/dL — AB (ref 70–99)
GLUCOSE-CAPILLARY: 125 mg/dL — AB (ref 70–99)
GLUCOSE-CAPILLARY: 96 mg/dL (ref 70–99)
Glucose-Capillary: 107 mg/dL — ABNORMAL HIGH (ref 70–99)
Glucose-Capillary: 113 mg/dL — ABNORMAL HIGH (ref 70–99)
Glucose-Capillary: 130 mg/dL — ABNORMAL HIGH (ref 70–99)

## 2014-02-22 LAB — URINE MICROSCOPIC-ADD ON

## 2014-02-22 LAB — URINALYSIS, ROUTINE W REFLEX MICROSCOPIC
Bilirubin Urine: NEGATIVE
Glucose, UA: NEGATIVE mg/dL
KETONES UR: NEGATIVE mg/dL
NITRITE: NEGATIVE
Protein, ur: 100 mg/dL — AB
Specific Gravity, Urine: 1.009 (ref 1.005–1.030)
UROBILINOGEN UA: 0.2 mg/dL (ref 0.0–1.0)
pH: 5 (ref 5.0–8.0)

## 2014-02-22 LAB — CBC WITH DIFFERENTIAL/PLATELET
BASOS PCT: 0 % (ref 0–1)
Basophils Absolute: 0 10*3/uL (ref 0.0–0.1)
Eosinophils Absolute: 0 10*3/uL (ref 0.0–0.7)
Eosinophils Relative: 0 % (ref 0–5)
HEMATOCRIT: 32.8 % — AB (ref 36.0–46.0)
Hemoglobin: 10.4 g/dL — ABNORMAL LOW (ref 12.0–15.0)
Lymphocytes Relative: 8 % — ABNORMAL LOW (ref 12–46)
Lymphs Abs: 1.7 10*3/uL (ref 0.7–4.0)
MCH: 28.1 pg (ref 26.0–34.0)
MCHC: 31.7 g/dL (ref 30.0–36.0)
MCV: 88.6 fL (ref 78.0–100.0)
MONO ABS: 0.9 10*3/uL (ref 0.1–1.0)
Monocytes Relative: 4 % (ref 3–12)
NEUTROS ABS: 19.1 10*3/uL — AB (ref 1.7–7.7)
Neutrophils Relative %: 88 % — ABNORMAL HIGH (ref 43–77)
Platelets: 334 10*3/uL (ref 150–400)
RBC: 3.7 MIL/uL — ABNORMAL LOW (ref 3.87–5.11)
RDW: 15.1 % (ref 11.5–15.5)
WBC: 21.7 10*3/uL — ABNORMAL HIGH (ref 4.0–10.5)

## 2014-02-22 LAB — PHOSPHORUS: Phosphorus: 6.5 mg/dL — ABNORMAL HIGH (ref 2.3–4.6)

## 2014-02-22 LAB — POCT I-STAT 3, ART BLOOD GAS (G3+)
ACID-BASE DEFICIT: 5 mmol/L — AB (ref 0.0–2.0)
Bicarbonate: 18.8 mEq/L — ABNORMAL LOW (ref 20.0–24.0)
O2 SAT: 95 %
PCO2 ART: 30.8 mmHg — AB (ref 35.0–45.0)
TCO2: 20 mmol/L (ref 0–100)
pH, Arterial: 7.392 (ref 7.350–7.450)
pO2, Arterial: 75 mmHg — ABNORMAL LOW (ref 80.0–100.0)

## 2014-02-22 LAB — MAGNESIUM: MAGNESIUM: 1.9 mg/dL (ref 1.5–2.5)

## 2014-02-22 LAB — MRSA PCR SCREENING: MRSA by PCR: POSITIVE — AB

## 2014-02-22 LAB — TROPONIN I
TROPONIN I: 4.24 ng/mL — AB (ref <0.30)
Troponin I: 2.66 ng/mL (ref <0.30)

## 2014-02-22 LAB — LACTIC ACID, PLASMA: Lactic Acid, Venous: 1.8 mmol/L (ref 0.5–2.2)

## 2014-02-22 MED ORDER — NICARDIPINE HCL IN NACL 40-0.83 MG/200ML-% IV SOLN
3.0000 mg/h | INTRAVENOUS | Status: DC
  Administered 2014-02-23: 12 mg/h via INTRAVENOUS
  Administered 2014-02-23: 4 mg/h via INTRAVENOUS
  Administered 2014-02-23: 5 mg/h via INTRAVENOUS
  Administered 2014-02-24: 6 mg/h via INTRAVENOUS
  Administered 2014-02-24: 10 mg/h via INTRAVENOUS
  Administered 2014-02-24: 6 mg/h via INTRAVENOUS
  Administered 2014-02-24: 5 mg/h via INTRAVENOUS
  Administered 2014-02-25 – 2014-02-26 (×12): 15 mg/h via INTRAVENOUS
  Administered 2014-02-27: 10 mg/h via INTRAVENOUS
  Administered 2014-02-27 (×3): 15 mg/h via INTRAVENOUS
  Administered 2014-02-28: 10 mg/h via INTRAVENOUS
  Administered 2014-02-28: 5 mg/h via INTRAVENOUS
  Administered 2014-02-28: 15 mg/h via INTRAVENOUS
  Administered 2014-02-28: 5 mg/h via INTRAVENOUS
  Administered 2014-03-01: 15 mg/h via INTRAVENOUS
  Administered 2014-03-01: 7.5 mg/h via INTRAVENOUS
  Administered 2014-03-01: 15 mg/h via INTRAVENOUS
  Administered 2014-03-01: 10 mg/h via INTRAVENOUS
  Administered 2014-03-02: 7.5 mg/h via INTRAVENOUS
  Administered 2014-03-02: 5 mg/h via INTRAVENOUS
  Filled 2014-02-22 (×36): qty 200

## 2014-02-22 MED ORDER — ETOMIDATE 2 MG/ML IV SOLN
0.3000 mg/kg | Freq: Once | INTRAVENOUS | Status: AC
Start: 2014-02-22 — End: 2014-02-22
  Administered 2014-02-22: 20 mg via INTRAVENOUS

## 2014-02-22 MED ORDER — PERFLUTREN LIPID MICROSPHERE
1.0000 mL | INTRAVENOUS | Status: AC | PRN
  Administered 2014-02-22: 2 mL via INTRAVENOUS
  Administered 2014-02-22: 1 mL via INTRAVENOUS
  Filled 2014-02-22: qty 10

## 2014-02-22 MED ORDER — LORAZEPAM 2 MG/ML IJ SOLN: 1.0000 mg | Freq: Once | INTRAMUSCULAR | Status: DC

## 2014-02-22 MED ORDER — ROCURONIUM BROMIDE 50 MG/5ML IV SOLN
1.0000 mg/kg | Freq: Once | INTRAVENOUS | Status: AC
  Administered 2014-02-22: 80 mg via INTRAVENOUS

## 2014-02-22 MED ORDER — NICARDIPINE HCL IN NACL 20-0.86 MG/200ML-% IV SOLN: 3.0000 mg/h | Freq: Once | INTRAVENOUS | Status: DC

## 2014-02-22 MED ORDER — NICARDIPINE HCL IN NACL 20-0.86 MG/200ML-% IV SOLN
3.0000 mg/h | INTRAVENOUS | Status: DC
  Administered 2014-02-22: 12 mg/h via INTRAVENOUS
  Administered 2014-02-22 (×2): 5 mg/h via INTRAVENOUS
  Administered 2014-02-22: 10 mg/h via INTRAVENOUS
  Administered 2014-02-22: 8 mg/h via INTRAVENOUS
  Administered 2014-02-22: 5 mg/h via INTRAVENOUS
  Administered 2014-02-22: 7.5 mg/h via INTRAVENOUS
  Filled 2014-02-22 (×6): qty 200

## 2014-02-22 MED ORDER — CHLORHEXIDINE GLUCONATE 0.12 % MT SOLN
15.0000 mL | Freq: Two times a day (BID) | OROMUCOSAL | Status: DC
  Administered 2014-02-22 – 2014-03-21 (×55): 15 mL via OROMUCOSAL
  Filled 2014-02-22 (×55): qty 15

## 2014-02-22 MED ORDER — FENTANYL CITRATE 0.05 MG/ML IJ SOLN
100.0000 ug | Freq: Once | INTRAMUSCULAR | Status: AC
  Administered 2014-02-22: 100 ug via INTRAVENOUS

## 2014-02-22 MED ORDER — MUPIROCIN 2 % EX OINT
1.0000 "application " | TOPICAL_OINTMENT | Freq: Two times a day (BID) | CUTANEOUS | Status: AC
  Administered 2014-02-22 – 2014-02-26 (×10): 1 via NASAL
  Filled 2014-02-22 (×2): qty 22

## 2014-02-22 MED ORDER — VITAL HIGH PROTEIN PO LIQD
1000.0000 mL | ORAL | Status: DC
  Administered 2014-02-22 – 2014-02-23 (×2): 1000 mL
  Filled 2014-02-22 (×5): qty 1000

## 2014-02-22 MED ORDER — PRO-STAT SUGAR FREE PO LIQD
60.0000 mL | Freq: Three times a day (TID) | ORAL | Status: DC
  Administered 2014-02-22 – 2014-02-23 (×5): 60 mL
  Filled 2014-02-22 (×8): qty 60

## 2014-02-22 MED ORDER — LORAZEPAM 2 MG/ML IJ SOLN
1.0000 mg | Freq: Once | INTRAMUSCULAR | Status: AC
  Administered 2014-02-21: 1 mg via INTRAVENOUS

## 2014-02-22 MED ORDER — ADULT MULTIVITAMIN LIQUID CH
5.0000 mL | Freq: Every day | ORAL | Status: DC
  Administered 2014-02-22 – 2014-03-21 (×27): 5 mL
  Filled 2014-02-22 (×28): qty 5

## 2014-02-22 MED ORDER — CHLORHEXIDINE GLUCONATE CLOTH 2 % EX PADS
6.0000 | MEDICATED_PAD | Freq: Every day | CUTANEOUS | Status: AC
  Administered 2014-02-22 – 2014-02-26 (×5): 6 via TOPICAL

## 2014-02-22 MED ORDER — CETYLPYRIDINIUM CHLORIDE 0.05 % MT LIQD
7.0000 mL | Freq: Four times a day (QID) | OROMUCOSAL | Status: DC
  Administered 2014-02-22 – 2014-03-21 (×105): 7 mL via OROMUCOSAL

## 2014-02-22 MED ORDER — FUROSEMIDE 10 MG/ML IJ SOLN
20.0000 mg/h | INTRAVENOUS | Status: DC
  Administered 2014-02-22: 20 mg/h via INTRAVENOUS
  Filled 2014-02-22 (×5): qty 25

## 2014-02-22 MED ORDER — NICARDIPINE HCL IN NACL 20-0.86 MG/200ML-% IV SOLN
INTRAVENOUS | Status: AC
  Administered 2014-02-22: 12 mg/h via INTRAVENOUS
  Filled 2014-02-22: qty 200

## 2014-02-22 NOTE — Progress Notes (Signed)
Pt noted to have pupillary changes. Right pupil measured 4 mm and had a sluggish reaction. Left pupil measured 2 mm and sluggish as well. Pt taken for a stat head CT. Dr. Pearlean Brownie reviewed results and examined pt. Dr. Pearlean Brownie reported no changes on the CT. No new orders at this time. Dr. Pearlean Brownie to discuss treatment options with Dr. Sung Amabile of Critical Care Medicine. Will continue to monitor patient and update as needed.

## 2014-02-22 NOTE — Progress Notes (Signed)
Patient's O2 sats have dropped to the mid to upper 80s. Dr. Molli Knock with CCM has been notified. FiO2 remains at 70% and PEEP is at 12. No new orders at this time.

## 2014-02-22 NOTE — Progress Notes (Signed)
INITIAL NUTRITION ASSESSMENT  DOCUMENTATION CODES Per approved criteria  -Morbid Obesity   INTERVENTION: Initiate Vital High Protein @ 25 ml/hr via OG tube   60 ml Prostat TID.    MVI daily  Tube feeding regimen provides 1200 kcal, 142 grams of protein, and 501 ml of H2O.   TF regimen and propofol at current rate providing 1398 total kcal/day (64 % of kcal needs)   NUTRITION DIAGNOSIS: Inadequate oral intake related to inability to eat as evidenced by NPO status  Goal: Enteral nutrition to provide 60-70% of estimated calorie needs (22-25 kcals/kg ideal body weight) and 100% of estimated protein needs, based on ASPEN guidelines for permissive underfeeding in critically ill obese individuals  Monitor:  Respiratory status, propofol infusion, TF initiation and tolerance, labs  Reason for Assessment: Consult received to initiate and manage enteral nutrition support.  43 y.o. female  Admitting Dx: Respiratory failure in setting of ICH  ASSESSMENT: Pt admitted 10/6 with left sided weakness, slurred speech, inability to stand up. In ED, found to have acute lateral right basal ganglia ICH with mild mass effect and 5mm MLS. Pt developed respiratory failure and was intubated.   Patient is currently intubated on ventilator support MV: 14.5 L/min Temp (24hrs), Avg:97.7 F (36.5 C), Min:97.5 F (36.4 C), Max:97.9 F (36.6 C)  Propofol: 7.5 ml/hr provides 198 kcal per day from lipid  Pt out of room for stat CT due to pupil changes.   Phosphorus elevated.   Height: Ht Readings from Last 1 Encounters:  02/21/14 5\' 4"  (1.626 m)    Weight: Wt Readings from Last 1 Encounters:  02/21/14 276 lb 10.8 oz (125.5 kg)    Ideal Body Weight: 54.5 kg  % Ideal Body Weight: 230%  Wt Readings from Last 10 Encounters:  02/21/14 276 lb 10.8 oz (125.5 kg)    Usual Body Weight: unknown  % Usual Body Weight: -  BMI:  Body mass index is 47.47 kg/(m^2).  Estimated Nutritional  Needs: Kcal: 2171 Protein: >/= 136 grams Fluid: > 2 L/day  Skin: intact  Diet Order:    EDUCATION NEEDS: -No education needs identified at this time   Intake/Output Summary (Last 24 hours) at 02/22/14 1509 Last data filed at 02/22/14 1400  Gross per 24 hour  Intake 1427.83 ml  Output    670 ml  Net 757.83 ml    Last BM: PTA   Labs:   Recent Labs Lab 02/21/14 1900 02/22/14 0035  NA 137 140  K 3.6* 4.0  CL 101 103  CO2 17* 20  BUN 42* 43*  CREATININE 3.58* 4.14*  CALCIUM 9.3 8.5  MG  --  1.9  PHOS  --  6.5*  GLUCOSE 131* 160*    CBG (last 3)   Recent Labs  02/22/14 0355 02/22/14 0821 02/22/14 1150  GLUCAP 112* 113* 125*    Scheduled Meds: . antiseptic oral rinse  7 mL Mouth Rinse QID  . chlorhexidine  15 mL Mouth Rinse BID  . Chlorhexidine Gluconate Cloth  6 each Topical Q0600  . feeding supplement (VITAL HIGH PROTEIN)  1,000 mL Per Tube Q24H  . insulin aspart  0-15 Units Subcutaneous 6 times per day  . mupirocin ointment  1 application Nasal BID  . pantoprazole (PROTONIX) IV  40 mg Intravenous QHS  . senna-docusate  1 tablet Oral BID    Continuous Infusions: . furosemide (LASIX) infusion 20 mg/hr (02/22/14 1239)  . niCARDipine 5 mg/hr (02/22/14 1200)  . propofol 10 mcg/kg/min (  02/22/14 1338)    History reviewed. No pertinent past medical history.  History reviewed. No pertinent past surgical history.  Kendell Bane RD, LDN, CNSC [phone] Pager [phone] After Hours Pager

## 2014-02-22 NOTE — Procedures (Signed)
Arterial Catheter Insertion Procedure Note Lynn Gentry [id number] 12-15-1970  Procedure: Insertion of Arterial Catheter  Indications: Blood pressure monitoring  Procedure Details Consent: Risks of procedure as well as the alternatives and risks of each were explained to the (patient/caregiver).  Consent for procedure obtained. Time Out: Verified patient identification, verified procedure, site/side was marked, verified correct patient position, special equipment/implants available, medications/allergies/relevent history reviewed, required imaging and test results available.  Performed  Maximum sterile technique was used including antiseptics. Skin prep: Chlorhexidine; local anesthetic administered 20 gauge catheter was inserted into left radial artery using the Seldinger technique.  Evaluation Blood flow good; BP tracing good. Complications: No apparent complications.   Newt Lukes 02/22/2014

## 2014-02-22 NOTE — Progress Notes (Signed)
RT note- MD called in box by RN concerning sp02.

## 2014-02-22 NOTE — ED Notes (Signed)
Pt. Appears anxious. Pt. Attempting to pull non-rebreather off. Explained to pt. Importance of keeping mask on.

## 2014-02-22 NOTE — Progress Notes (Signed)
PULMONARY / CRITICAL CARE MEDICINE   Name: Lynn Gentry MRN: [id number] DOB: Dec 24, 1970    ADMISSION DATE:  02/21/2014 CONSULTATION DATE:  02/22/2014  REFERRING MD :  Pearlean Brownie  CHIEF COMPLAINT:  Respiratory failure in setting of ICH  INITIAL PRESENTATION: 43 y.o. F brought to [facility] ED on 10/6 with left sided weakness, slurred speech, inability to stand up.  In ED, found to have acute lateral right basal ganglia ICH with mild mass effect and 67mm MLS.  Admitted to neuro ICU and developed acute respiratory failure, failed bipap and PCCM called for intubation and management. She was admitted to neuro ICU and later that evening had increasing O2 demands.  She had no improvement in SpO2, work of breathing, and mental status despite 3 hours of BiPAP.  PCCM was consulted for intubation.   STUDIES:  CT Head10/6 >>> acute hemorrhage into the lateral right basal ganglia, likely indicating a hypertensive hemorrhage/hemorrhagic stroke.  Mild mass effect with 36mm MLS to the left. Renal u/s 10/7>>> echogenic kidneys s/w ckd, no hydro.   SIGNIFICANT EVENTS: 10/6 - admitted for ICH, developed respiratory failure requiring intubation.   VITAL SIGNS: Temp:  [97.5 F (36.4 C)-97.9 F (36.6 C)] 97.9 F (36.6 C) (10/07 1200) Pulse Rate:  [41-121] 85 (10/07 1330) Resp:  [0-64] 20 (10/07 1330) BP: (106-226)/(54-111) 111/62 mmHg (10/07 1330) SpO2:  [69 %-100 %] 100 % (10/07 1330) FiO2 (%):  [50 %-100 %] 50 % (10/07 1330) Weight:  [125.5 kg (276 lb 10.8 oz)] 125.5 kg (276 lb 10.8 oz) (10/06 2200) HEMODYNAMICS: CVP:  [18 mmHg-25 mmHg] 22 mmHg VENTILATOR SETTINGS: Vent Mode:  [-] PRVC FiO2 (%):  [50 %-100 %] 50 % Set Rate:  [16 bmp-18 bmp] 18 bmp Vt Set:  [440 mL] 440 mL PEEP:  [10 cmH20-14 cmH20] 10 cmH20 Plateau Pressure:  [24 cmH20-35 cmH20] 24 cmH20 INTAKE / OUTPUT: Intake/Output     10/06 0701 - 10/07 0700 10/07 0701 - 10/08 0700   I.V. (mL/kg) 904.7 (7.2) 354.4 (2.8)   IV Piggyback 116     Total Intake(mL/kg) 1020.7 (8.1) 354.4 (2.8)   Urine (mL/kg/hr) 225 190 (0.2)   Emesis/NG output 150 90 (0.1)   Total Output 375 280   Net +645.7 +74.4          PHYSICAL EXAMINATION: General: Obese female, NAD sedated on vent  Neuro: sedated on vent, follows commands on WUA per nsg but very tachypneic, pupils 17mm, sluggish  HEENT: [place]/AT. PERRL, sclerae anicteric. ETT Cardiovascular: RRR, no M/R/G.  Lungs: resps even non labored on vent, tachypneic, diminished bases, few scattered rhonchi R>L  Abdomen: BS hypoactive, soft, NT/ND.  Musculoskeletal: No gross deformities, 1+ symmetric BLE edema .  Skin: Intact, warm, no rashes.  LABS: PULMONARY  Recent Labs Lab 02/21/14 2151 02/22/14 0110 02/22/14 0458  PHART 7.288* 7.265* 7.316*  PCO2ART 37.2 38.6 35.1  PO2ART 57.0* 110.0* 151.0*  HCO3 17.8* 16.9* 17.5*  TCO2 19 18.1 18.6  O2SAT 86.0 96.8 99.1    CBC  Recent Labs Lab 02/21/14 1900 02/22/14 0035  HGB 11.4* 10.4*  HCT 35.2* 32.8*  WBC 14.6* 21.7*  PLT 329 334    COAGULATION  Recent Labs Lab 02/21/14 1900  INR 1.14    CARDIAC    Recent Labs Lab 02/21/14 1900 02/22/14 0640  TROPONINI <0.30 2.66*    Recent Labs Lab 02/21/14 1900  PROBNP 36455.0*     CHEMISTRY  Recent Labs Lab 02/21/14 1900 02/22/14 0035  NA 137 140  K  3.6* 4.0  CL 101 103  CO2 17* 20  GLUCOSE 131* 160*  BUN 42* 43*  CREATININE 3.58* 4.14*  CALCIUM 9.3 8.5  MG  --  1.9  PHOS  --  6.5*   Estimated Creatinine Clearance: 23 ml/min (by C-G formula based on Cr of 4.14).   LIVER  Recent Labs Lab 02/21/14 1900  AST 25  ALT 30  ALKPHOS 152*  BILITOT 0.9  PROT 7.9  ALBUMIN 3.7  INR 1.14     INFECTIOUS  Recent Labs Lab 02/22/14 0035  LATICACIDVEN 1.8     ENDOCRINE CBG (last 3)   Recent Labs  02/22/14 0355 02/22/14 0821 02/22/14 1150  GLUCAP 112* 113* 125*     CXR - pulm edema, small bilat pleural effusions   ASSESSMENT /  PLAN:  PULMONARY OETT 10/6 >>> A: Acute hypoxemic respiratory failure Severe acute pulmonary edema  Doubt PE   P:   Cont vent support with peep 10 VAP bundle. Daily SBT CXR in am  Hold further abg  Diuresis as below    CARDIOVASCULAR CVL R IJ 10/6 >>> A:  HTN crisis  Severe pulmonary edema - BNP 36K -  Acute systolic v diastolic chf Elevated troponin  P:  Aggressive lasix - add gtt  Cont cardene gtt and titrate  Goal CVP 8 - 12. Trend troponin / lactate. Echo pending  Consider cards input    RENAL A:   Acute renal failure with volume overload - suspect baseline CKD, baseline Scr unknown  P:   Will transition to lasix gtt  If no improvement, may need renal and CRRT for volume removal  F/u chem    GASTROINTESTINAL A:   Intubated  P:   SUP: Pantoprazole. Will start TF 10/7  HEMATOLOGIC A:   At risk for anemia of critical illness  P:  SCD's in setting of IC Hge. No heparin CBC in AM.  INFECTIOUS A:   No evidence of infection P:   Monitor off abx   ENDOCRINE A:   Hyperglycemia    P:   CBG's q4hr. SSI.  NEUROLOGIC A:   Intracranial hemorrhage with encephalopathy  P:   Per neuro  MRI pending  Sedation:  diprivan RASS goal: 0 to -1. Daily WUA.  FAMILY Family updates: No family at bedside  IDT family conference: N/a   TODAY'S SUMMARY: ICH r/t HTN with acute resp failure in setting pulm edema.  Change to lasix gtt.  If no improvement may need renal to see and CRRT for volume removal. Will hold off on cards input for now.    Dirk DressKaty Whiteheart, NP 02/22/2014  2:31 PM Pager: [phone] or [phone]  *Care during the described time interval was provided by me and/or other providers on the critical care team. I have reviewed this patient's available data, including medical history, events of note, physical examination and test results as part of my evaluation.  PCCM ATTENDING: I have interviewed and examined the patient and  reviewed the database. I have formulated the assessment and plan as reflected in the note above with amendments made by me. 40 mins of direct critical care time provided. Discussed with Dr Pearlean BrownieSethi. Change to furosemide gtt. Might need HD  Billy Fischeravid Simonds, MD;  PCCM service; Mobile [phone]

## 2014-02-22 NOTE — Progress Notes (Signed)
RT note-Patient taken to CT, and now back and on current full support settings.

## 2014-02-22 NOTE — Progress Notes (Signed)
VASCULAR LAB PRELIMINARY  PRELIMINARY  PRELIMINARY  PRELIMINARY  Carotid duplex  completed.    Preliminary report:  Bilateral:  1-39% ICA stenosis.  Vertebral arteries not imaged due to central line on right and patient position on left.     Kert Shackett, RVT 02/22/2014, 10:35 AM

## 2014-02-22 NOTE — Progress Notes (Signed)
RT note- Patient continues to have fluctuating sp02 fio2 and peep increased as charted.

## 2014-02-22 NOTE — Progress Notes (Signed)
Dr. Sung Amabile made aware of low urinary output.  No interventions at this time  Jannette Spanner

## 2014-02-22 NOTE — Procedures (Signed)
Arterial Catheter Insertion Procedure Note Lynn Gentry [id number] 14-Oct-1970  Procedure: Insertion of Arterial Catheter  Indications: Blood pressure monitoring and Frequent blood sampling  Procedure Details Consent: Unable to obtain consent because of emergent medical necessity. Pt. Intubated on ventilator. Time Out: Verified patient identification, verified procedure, site/side was marked, verified correct patient position, special equipment/implants available, medications/allergies/relevent history reviewed, required imaging and test results available.  Performed  Maximum sterile technique was used including cap, gloves, gown, hand hygiene, mask and sheet. Skin prep: Chlorhexidine; local anesthetic administered 20 gauge catheter was inserted into right radial artery using the Seldinger technique.  Evaluation Blood flow good; BP tracing good. Complications: No apparent complications.  Assisted by Merdis Delay, RRT.  [name] 02/22/2014

## 2014-02-22 NOTE — Progress Notes (Addendum)
  Echocardiogram 2D Echocardiogram with Definity has been performed.  Cathie Beams 02/22/2014, 3:53 PM

## 2014-02-22 NOTE — Progress Notes (Addendum)
STROKE TEAM PROGRESS NOTE   HISTORY Lynn Gentry is an 43 y.o. female with no previously documented medical disorder who was brought to [facility] with new onset slurred speech and left-sided weakness. Patient went to work as usual until 1500. She was found by her son at home at 1600 unable to get up out of a chair with left sided weakness, slurred speech. Patient was last known well around 3 PM this afternoon 02/21/2014. CT scan of her head showed an acute 4.1 x 1.5 cm right basal ganglia hemorrhage with mild mass effect and right to left shift of 5 mm. Patient's blood pressure was markedly elevated at 226/111. She was started on Cardene drip and blood pressure responded well. She has shown continual respiratory difficulty. She was initially given oxygen by nasal cannula followed by NBR, and subsequently placed on BiPAP. She continued to have oxygen saturations only in the high 80s to low 90s on 100% O2, with a respiratory rates in the 40s. ABG showed pH of 2.82, PCO2 37 PO2 of 57. Chest x-ray showed mild cardiomegaly with possible asymmetric pulmonary edema. BNP was 36,455. Because of deteriorating respiratory status intubation and mechanical ventilation was elected. CCM service was consulted. Patient was not administered TPA secondary to ICH. She was admitted to the neuro ICU  for further evaluation and treatment.   SUBJECTIVE (INTERVAL HISTORY) No family is at the bedside.     OBJECTIVE Temp:  [97.5 F (36.4 C)-97.8 F (36.6 C)] 97.5 F (36.4 C) (10/07 0800) Pulse Rate:  [41-121] 75 (10/07 0700) Cardiac Rhythm:  [-]  Resp:  [0-64] 25 (10/07 0700) BP: (106-226)/(54-111) 106/54 mmHg (10/07 0700) SpO2:  [69 %-100 %] 99 % (10/07 0700) FiO2 (%):  [60 %-100 %] 60 % (10/07 0816) Weight:  [125.5 kg (276 lb 10.8 oz)] 125.5 kg (276 lb 10.8 oz) (10/06 2200)   Recent Labs Lab 02/21/14 2333 02/22/14 0355 02/22/14 0821  GLUCAP 166* 112* 113*    Recent Labs Lab 02/21/14 1900  02/22/14 0035  NA 137 140  K 3.6* 4.0  CL 101 103  CO2 17* 20  GLUCOSE 131* 160*  BUN 42* 43*  CREATININE 3.58* 4.14*  CALCIUM 9.3 8.5  MG  --  1.9  PHOS  --  6.5*    Recent Labs Lab 02/21/14 1900  AST 25  ALT 30  ALKPHOS 152*  BILITOT 0.9  PROT 7.9  ALBUMIN 3.7    Recent Labs Lab 02/21/14 1900 02/22/14 0035  WBC 14.6* 21.7*  NEUTROABS 12.4* 19.1*  HGB 11.4* 10.4*  HCT 35.2* 32.8*  MCV 85.0 88.6  PLT 329 334    Recent Labs Lab 02/21/14 1900 02/22/14 0640  TROPONINI <0.30 2.66*    Recent Labs  02/21/14 1900  LABPROT 14.6  INR 1.14    Recent Labs  02/22/14 0651  COLORURINE YELLOW  LABSPEC 1.009  PHURINE 5.0  GLUCOSEU NEGATIVE  HGBUR MODERATE*  BILIRUBINUR NEGATIVE  KETONESUR NEGATIVE  PROTEINUR 100*  UROBILINOGEN 0.2  NITRITE NEGATIVE  LEUKOCYTESUR SMALL*    No results found for this basename: chol, trig, hdl, cholhdl, vldl, ldlcalc   No results found for this basename: HGBA1C   No results found for this basename: labopia, cocainscrnur, labbenz, amphetmu, thcu, labbarb    No results found for this basename: ETH,  in the last 168 hours  Ct Head Wo Contrast 02/22/2014   1. No significant interval change in the patient's intraparenchymal hemorrhage at the right lateral basal ganglia, measuring 4.1  x 1.2 cm, with mild surrounding vasogenic edema. Mild associated mass effect, with 4 mm of leftward midline shift again seen. 2. Scattered small vessel ischemic microangiopathy. 3. Bilateral proptosis noted.    02/21/2014   1. Acute hemorrhage into the lateral right basal ganglia, likely indicating a hypertensive hemorrhage/hemorrhagic stroke. 2. Mild mass effect with shift of the midline to the left approximating 5 mm. Critical   US Renal Port 02/22/2014    1. Small echogenic kidneys consistent with chronic renal medical disease. No hydronephrosis. 2. The urinary bladder is decompressed by Foley catheter and cannot be evaluated.     Dg Chest Port 1  View 02/22/2014    1. Satisfactory positioning of endotracheal tube and right IJ line. 2. Cardiomegaly with persistent diffuse interstitial and airspace disease likely reflecting edema and congestive heart failure. 3. Bilateral pleural effusions and basilar airspace disease. This likely reflects atelectasis.    02/21/2014    Mild cardiac enlargement and possible asymmetric pulmonary edema. However, exam is limited by body habitus.      PHYSICAL EXAM Young obese Caucasian lady not in distress..Intubatedt. Afebrile. Head is nontraumatic. Neck is supple without bruit. l. Cardiac exam soft ejection murmur .no gallop. Lungs bilateral rhonchi to auscultation. Distal pulses are well felt.  Neurological Exam : intubated sedated on propofol. Slight downgaze preference. Pupils 3 mm reactive. Fundi not visualized. Left lower face weakness. Moves rt more than left upper extremity and localizes. Moves RLE well and Left LE minimally to painful stimuli. Left Plantar upgoing and right down going ASSESSMENT/PLAN Ms. Lynn Gentry is a 43 y.o. female with no documented medical history found with new onset slurred speech and left-sided weakness.   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, nondominant     MRI  pending  MRA  pending  Carotid Doppler  pending  2D Echo  pending  HgbA1c ordered  SCDs for VTE prophylaxis  NPO    Bedrest  Resultant VDRF, left hemiparesis  no antithrombotics prior to admission  Ongoing aggressive risk factor management  Therapy recommendations:  pending  Disposition:  pending  Acute Respiratory Failure with severe acute pulmonary edema  Intubated  Sedated  CCM following  Diuresing, plan change to Lasix drip  Check d-dimer  Leukocytosis  White blood cells 21.7  Monitoring off antibiotics  Malignant Hypertension  BP 226/111 at onset  Home meds:   none  SBP goal < 140 due to presence of end organ damage  On  cardene drip  BP 106-226/54-111 past 24h (02/22/2014 @ 8:44 AM)  Unstable  Elevated troponins  2.66  Continue trending  Acute renal failure with volume overload, suspect baseline chronic kidney disease  Diuresis and management per critical care  Other Stroke Risk Factors ETOH use   Morbid Obesity, Body mass index is 47.47 kg/(m^2).   Hospital day # 1  Annie Main, MSN, RN, ANVP-BC, ANP-BC, [facility] Pager: [phone] 02/22/2014 9:26 AM  I have personally examined this patient, reviewed notes, independently viewed imaging studies, participated in medical decision making and plan of care. I have made any additions or clarifications directly to the above note. Agree with note above. Maintain strict control of blood pressure and close neurological monitoring. Check MRI scan of brain later today Delia Heady, MD Medical Director [facility] Pager: [phone] 02/22/2014 2:23 PM  This patient is critically ill and at significant risk of neurological worsening, death and care requires constant monitoring of vital signs, hemodynamics,respiratory  and cardiac monitoring,review of multiple databases, neurological assessment, discussion with family, other specialists and medical decision making of high complexity.I have made any additions or clarifications directly to the above note.  I spent 35minutes of neurocritical care time  in the care of  this patient. Waldemar Siegel, MD   To coDelia Headyntact Stroke Continuity provider, please refer to [email or web address]. After hours, contact General Neurology

## 2014-02-22 NOTE — ED Notes (Signed)
Pt. Left hand grip absent.

## 2014-02-22 NOTE — ED Notes (Signed)
[facility] at bedside 

## 2014-02-22 NOTE — Progress Notes (Signed)
UR completed.  Lerin Jech, RN BSN [facility] CCM Trauma/Neuro ICU Case Manager [phone]  

## 2014-02-22 NOTE — Consult Note (Deleted)
PULMONARY / CRITICAL CARE MEDICINE   Name: Lynn NationsStephanie Ullmer MRN: [id number] DOB: [birth year]    ADMISSION DATE:  02/21/2014 CONSULTATION DATE:  02/22/2014  REFERRING MD :  Pearlean BrownieSethi  CHIEF COMPLAINT:  Respiratory failure in setting of ICH  INITIAL PRESENTATION: 43 y.o. F [facility] with left sided weakness, slurred speech, inability to stand up.  In ED, found to have acute lateral right basal ganglia ICH with mild mass effect and 5mm MLS.  Admitted to neuro ICU and developed acute respiratory failure, failed bipap and PCCM called for intubation and management. She was admitted to neuro ICU and later that evening had increasing O2 demands.  She had no improvement in SpO2, work of breathing, and mental status despite 3 hours of BiPAP.  PCCM was consulted for intubation.   STUDIES:  CT Head10/6 >>> acute hemorrhage into the lateral right basal ganglia, likely indicating a hypertensive hemorrhage/hemorrhagic stroke.  Mild mass effect with 5mm MLS to the left. Renal u/s 10/7>>> echogenic kidneys s/w ckd, no hydro.   SIGNIFICANT EVENTS: 10/6 - admitted for ICH, developed respiratory failure requiring intubation.   VITAL SIGNS: Temp:  [97.5 F (36.4 C)-97.8 F (36.6 C)] 97.5 F (36.4 C) (10/07 0800) Pulse Rate:  [41-121] 88 (10/07 1145) Resp:  [0-64] 28 (10/07 1145) BP: (106-226)/(54-111) 117/60 mmHg (10/07 1145) SpO2:  [69 %-100 %] 93 % (10/07 1145) FiO2 (%):  [60 %-100 %] 60 % (10/07 1145) Weight:  [276 lb 10.8 oz (125.5 kg)] 276 lb 10.8 oz (125.5 kg) (10/06 2200) HEMODYNAMICS: CVP:  [18 mmHg-25 mmHg] 20 mmHg VENTILATOR SETTINGS: Vent Mode:  [-] PRVC FiO2 (%):  [60 %-100 %] 60 % Set Rate:  [16 bmp-18 bmp] 18 bmp Vt Set:  [440 mL] 440 mL PEEP:  [10 cmH20-14 cmH20] 10 cmH20 Plateau Pressure:  [24 cmH20-35 cmH20] 24 cmH20 INTAKE / OUTPUT: Intake/Output     10/06 0701 - 10/07 0700 10/07 0701 - 10/08 0700   I.V. (mL/kg) 904.7 (7.2) 214.6 (1.7)   IV Piggyback 116    Total  Intake(mL/kg) 1020.7 (8.1) 214.6 (1.7)   Urine (mL/kg/hr) 225 65 (0.1)   Emesis/NG output 150 90 (0.1)   Total Output 375 155   Net +645.7 +59.6          PHYSICAL EXAMINATION: General: Obese female, NAD sedated on vent  Neuro: sedated on vent, follows commands on WUA per nsg but very tachypneic, pupils 3mm, sluggish  HEENT: [place]/AT. PERRL, sclerae anicteric. ETT Cardiovascular: RRR, no M/R/G.  Lungs: resps even non labored on vent, tachypneic, diminished bases, few scattered rhonchi R>L  Abdomen: BS hypoactive, soft, NT/ND.  Musculoskeletal: No gross deformities, 1+ symmetric BLE edema .  Skin: Intact, warm, no rashes.  LABS: PULMONARY  Recent Labs Lab 02/21/14 2151 02/22/14 0110 02/22/14 0458  PHART 7.288* 7.265* 7.316*  PCO2ART 37.2 38.6 35.1  PO2ART 57.0* 110.0* 151.0*  HCO3 17.8* 16.9* 17.5*  TCO2 19 18.1 18.6  O2SAT 86.0 96.8 99.1    CBC  Recent Labs Lab 02/21/14 1900 02/22/14 0035  HGB 11.4* 10.4*  HCT 35.2* 32.8*  WBC 14.6* 21.7*  PLT 329 334    COAGULATION  Recent Labs Lab 02/21/14 1900  INR 1.14    CARDIAC    Recent Labs Lab 02/21/14 1900 02/22/14 0640  TROPONINI <0.30 2.66*    Recent Labs Lab 02/21/14 1900  PROBNP 36455.0*     CHEMISTRY  Recent Labs Lab 02/21/14 1900 02/22/14 0035  NA 137 140  K  3.6* 4.0  CL 101 103  CO2 17* 20  GLUCOSE 131* 160*  BUN 42* 43*  CREATININE 3.58* 4.14*  CALCIUM 9.3 8.5  MG  --  1.9  PHOS  --  6.5*   Estimated Creatinine Clearance: 23 ml/min (by C-G formula based on Cr of 4.14).   LIVER  Recent Labs Lab 02/21/14 1900  AST 25  ALT 30  ALKPHOS 152*  BILITOT 0.9  PROT 7.9  ALBUMIN 3.7  INR 1.14     INFECTIOUS  Recent Labs Lab 02/22/14 0035  LATICACIDVEN 1.8     ENDOCRINE CBG (last 3)   Recent Labs  02/21/14 2333 02/22/14 0355 02/22/14 0821  GLUCAP 166* 112* 113*     CXR - pulm edema, small bilat pleural effusions   ASSESSMENT / PLAN:  PULMONARY OETT  10/6 >>> A: Acute hypoxemic respiratory failure with severe acute pulmonary edema - in setting of right lateral basal ganglia ICH    P:   Cont vent support with peep 10 VAP bundle. Daily SBT CXR in am  Hold further abg  Diuresis as below    CARDIOVASCULAR CVL R IJ 10/6 >>> A:  HTN crisis  Severe pulmonary edema - BNP 36K -  Acute systolic v diastolic chf Elevated troponin  P:  Aggressive lasix - add gtt  Cont cardene gtt and titrate  Goal CVP 8 - 12. Trend troponin / lactate. Echo pending  Consider cards input    RENAL A:   Acute renal failure with volume overload - suspect baseline CKD, baseline Scr unknown  P:   Will transition to lasix gtt  If no improvement, may need renal and CRRT for volume removal  F/u chem    GASTROINTESTINAL A:   Intubated  P:   SUP: Pantoprazole. Will start TF 10/7  HEMATOLOGIC A:   At risk for anemia of critical illness  P:  SCD's in setting of IC Hge. No heparin CBC in AM.  INFECTIOUS A:   No evidence of infection P:   Monitor off abx   ENDOCRINE A:   Hyperglycemia    P:   CBG's q4hr. SSI.  NEUROLOGIC A:   Intracranial hemorrhage with encephalopathy  P:   Per neuro  MRI pending  Sedation:  diprivan RASS goal: 0 to -1. Daily WUA.  FAMILY Family updates: No family at bedside  IDT family conference: N/a   TODAY'S SUMMARY: ICH r/t HTN with acute resp failure in setting pulm edema.  Change to lasix gtt.  If no improvement may need renal to see and CRRT for volume removal. Will hold off on cards input for now.    Dirk Dress, NP 02/22/2014  11:55 AM Pager: [phone] or [phone]  *Care during the described time interval was provided by me and/or other providers on the critical care team. I have reviewed this patient's available data, including medical history, events of note, physical examination and test results as part of my evaluation.

## 2014-02-22 NOTE — Progress Notes (Signed)
CRITICAL VALUE ALERT  Critical value received: Troponin 2.66  Date of notification:  02/22/2014  Time of notification:  0740  Critical value read back:Yes.    Nurse who received alert:  Audree Bane, RN  MD notified (1st page):  Dr. Sung Amabile  Time of first page:  [phone]  MD notified (2nd page):  Time of second page:  Responding MD:  Dr. Sung Amabile  Time MD responded:  7:53

## 2014-02-23 ENCOUNTER — Inpatient Hospital Stay (HOSPITAL_COMMUNITY): Payer: 59

## 2014-02-23 LAB — BLOOD GAS, ARTERIAL
Acid-base deficit: 6.3 mmol/L — ABNORMAL HIGH (ref 0.0–2.0)
Bicarbonate: 17.9 mEq/L — ABNORMAL LOW (ref 20.0–24.0)
DRAWN BY: 41308
FIO2: 0.7 %
O2 Saturation: 99.5 %
PATIENT TEMPERATURE: 98.6
PCO2 ART: 31.4 mmHg — AB (ref 35.0–45.0)
PEEP/CPAP: 12 cmH2O
RATE: 18 resp/min
TCO2: 18.9 mmol/L (ref 0–100)
VT: 440 mL
pH, Arterial: 7.375 (ref 7.350–7.450)
pO2, Arterial: 218 mmHg — ABNORMAL HIGH (ref 80.0–100.0)

## 2014-02-23 LAB — GLUCOSE, CAPILLARY
GLUCOSE-CAPILLARY: 94 mg/dL (ref 70–99)
Glucose-Capillary: 114 mg/dL — ABNORMAL HIGH (ref 70–99)
Glucose-Capillary: 100 mg/dL — ABNORMAL HIGH (ref 70–99)
Glucose-Capillary: 107 mg/dL — ABNORMAL HIGH (ref 70–99)
Glucose-Capillary: 115 mg/dL — ABNORMAL HIGH (ref 70–99)

## 2014-02-23 LAB — BASIC METABOLIC PANEL
Anion gap: 17 — ABNORMAL HIGH (ref 5–15)
BUN: 60 mg/dL — ABNORMAL HIGH (ref 6–23)
CO2: 16 mEq/L — ABNORMAL LOW (ref 19–32)
CREATININE: 5.7 mg/dL — AB (ref 0.50–1.10)
Calcium: 8.6 mg/dL (ref 8.4–10.5)
Chloride: 104 mEq/L (ref 96–112)
GFR calc Af Amer: 10 mL/min — ABNORMAL LOW (ref >90)
GFR, EST NON AFRICAN AMERICAN: 8 mL/min — AB (ref >90)
Glucose, Bld: 116 mg/dL — ABNORMAL HIGH (ref 70–99)
Potassium: 4 mEq/L (ref 3.7–5.3)
SODIUM: 137 meq/L (ref 137–147)

## 2014-02-23 LAB — CBC
HCT: 27.7 % — ABNORMAL LOW (ref 36.0–46.0)
Hemoglobin: 8.9 g/dL — ABNORMAL LOW (ref 12.0–15.0)
MCH: 27.7 pg (ref 26.0–34.0)
MCHC: 32.1 g/dL (ref 30.0–36.0)
MCV: 86.3 fL (ref 78.0–100.0)
PLATELETS: 225 10*3/uL (ref 150–400)
RBC: 3.21 MIL/uL — ABNORMAL LOW (ref 3.87–5.11)
RDW: 15.1 % (ref 11.5–15.5)
WBC: 10.8 10*3/uL — AB (ref 4.0–10.5)

## 2014-02-23 LAB — IRON AND TIBC: UIBC: 244 ug/dL (ref 125–400)

## 2014-02-23 LAB — TRIGLYCERIDES: TRIGLYCERIDES: 71 mg/dL (ref <150)

## 2014-02-23 LAB — FERRITIN: Ferritin: 76 ng/mL (ref 10–291)

## 2014-02-23 LAB — HEPATITIS B SURFACE ANTIBODY,QUALITATIVE: Hep B S Ab: NEGATIVE

## 2014-02-23 LAB — HEMOGLOBIN A1C
Hgb A1c MFr Bld: 5.8 % — ABNORMAL HIGH (ref <5.7)
MEAN PLASMA GLUCOSE: 120 mg/dL — AB (ref <117)

## 2014-02-23 LAB — MAGNESIUM: MAGNESIUM: 1.9 mg/dL (ref 1.5–2.5)

## 2014-02-23 LAB — D-DIMER, QUANTITATIVE: D-Dimer, Quant: 2.94 ug/mL-FEU — ABNORMAL HIGH (ref 0.00–0.48)

## 2014-02-23 LAB — HEPATITIS B CORE ANTIBODY, TOTAL: HEP B C TOTAL AB: NONREACTIVE

## 2014-02-23 LAB — PHOSPHORUS: Phosphorus: 5.9 mg/dL — ABNORMAL HIGH (ref 2.3–4.6)

## 2014-02-23 LAB — HEPATITIS B SURFACE ANTIGEN: Hepatitis B Surface Ag: NEGATIVE

## 2014-02-23 MED ORDER — FENTANYL CITRATE 0.05 MG/ML IJ SOLN
25.0000 ug | INTRAMUSCULAR | Status: DC | PRN
  Administered 2014-02-27: 50 ug via INTRAVENOUS
  Administered 2014-02-28 – 2014-03-09 (×13): 100 ug via INTRAVENOUS
  Administered 2014-03-09: 50 ug via INTRAVENOUS
  Administered 2014-03-10 – 2014-03-13 (×2): 100 ug via INTRAVENOUS
  Administered 2014-03-13 (×3): 50 ug via INTRAVENOUS
  Administered 2014-03-14 – 2014-03-15 (×7): 100 ug via INTRAVENOUS
  Filled 2014-02-23 (×28): qty 2

## 2014-02-23 MED ORDER — SODIUM CHLORIDE 0.9 % IV SOLN
0.0000 ug/h | INTRAVENOUS | Status: DC
  Administered 2014-02-23: 25 ug/h via INTRAVENOUS
  Administered 2014-02-25: 50 ug/h via INTRAVENOUS
  Administered 2014-02-26 – 2014-03-03 (×2): 100 ug/h via INTRAVENOUS
  Filled 2014-02-23 (×5): qty 50

## 2014-02-23 NOTE — Clinical Documentation Improvement (Signed)
Abnormal diagnostic findings (MRI scans, CT scans, etc.) are not coded and reported unless the physician indicates their clinical significance. If possible, please help by clarifying the diagnostic and/or clinical significance of the abnormal diagnostic study for this patient. Cerebral Edema   Cytotoxic Edema   Subfalcine Herniation   Uncal/Transtentorial Herniation   Vasogenic Edema   Other brain herniation (please specify) _______________   Other cause (please specify) _______________   Lynn Gentry to determine   Unknown Supportive Information: per progress note 02/22/14 " right basal ganglia hemorrhage with mild mass effect and right to left shift of 5 mm."  "Mild mass effect with 25mm MLS to the left"  Thank you,  Andy Gauss ,RN Clinical Documentation Specialist:  [phone]  [facility]- Health Information Management

## 2014-02-23 NOTE — Progress Notes (Signed)
PULMONARY / CRITICAL CARE MEDICINE   Name: Lynn NationsStephanie Doswell MRN: [id number] DOB: 29-Mar-1971    ADMISSION DATE:  02/21/2014 CONSULTATION DATE:  02/23/2014  REFERRING MD :  Pearlean BrownieSethi  CHIEF COMPLAINT:  Respiratory failure in setting of ICH  INITIAL PRESENTATION: 43 [facility]. F [facility] with left sided weakness, slurred speech, inability to stand up.  In ED, found to have acute lateral right basal ganglia ICH with mild mass effect and 5mm MLS.  Admitted to neuro ICU and developed acute respiratory failure, failed bipap and PCCM called for intubation and management. She was admitted to neuro ICU and later that evening had increasing O2 demands.  She had no improvement in SpO2, work of breathing, and mental status despite 3 hours of BiPAP.  PCCM was consulted for intubation.   STUDIES:  CT Head10/6 >>> acute hemorrhage into the lateral right basal ganglia, likely indicating a hypertensive hemorrhage/hemorrhagic stroke.  Mild mass effect with 5mm MLS to the left. Renal u/s 10/7>>> echogenic kidneys s/w ckd, no hydro.   SIGNIFICANT EVENTS: 10/6 - admitted for ICH, developed respiratory failure requiring intubation. 10/07 Persistent hypertension. Remained on nicardipine gtt. Persistent volume overload snd pulm edema pattern on CXR. Lasix gtt resulted in minimal increase in Uo 10/08 Worsening renal function, oliguria. Renal consult. HD performed  VITAL SIGNS: Temp:  [97.2 F (36.2 C)-99.5 F (37.5 C)] 99.5 F (37.5 C) (10/08 1955) Pulse Rate:  [84-99] 94 (10/08 2000) Resp:  [0-35] 20 (10/08 2000) BP: (117-174)/(55-74) 144/67 mmHg (10/08 2000) SpO2:  [87 %-100 %] 95 % (10/08 2000) FiO2 (%):  [60 %-70 %] 60 % (10/08 1926) Weight:  [128.4 kg (283 lb 1.1 oz)-130.3 kg (287 lb 4.2 oz)] 128.4 kg (283 lb 1.1 oz) (10/08 1737) HEMODYNAMICS: CVP:  [10 mmHg-27 mmHg] 14 mmHg VENTILATOR SETTINGS: Vent Mode:  [-] PRVC FiO2 (%):  [60 %-70 %] 60 % Set Rate:  [18 bmp] 18 bmp Vt Set:  [440 mL] 440  mL PEEP:  [10 cmH20-12 cmH20] 10 cmH20 Plateau Pressure:  [21 cmH20-28 cmH20] 25 cmH20 INTAKE / OUTPUT: Intake/Output     10/08 0701 - 10/09 0700   I.V. (mL/kg) 461.9 (3.6)   NG/GT 370   Total Intake(mL/kg) 831.9 (6.5)   Urine (mL/kg/hr) 40 (0)   Emesis/NG output    Other 2500 (1.4)   Total Output 2540   Net -1708.1         PHYSICAL EXAMINATION: General: Obese female, NAD sedated on vent  Neuro: sedated on vent, follows commands on WUA per nsg but very tachypneic, pupils 3mm, sluggish  HEENT: [place]/AT. PERRL, sclerae anicteric. ETT Cardiovascular: RRR, no M/R/G.  Lungs: resps even non labored on vent, tachypneic, diminished bases, few scattered rhonchi R>L  Abdomen: BS hypoactive, soft, NT/ND.  Musculoskeletal: No gross deformities, 1+ symmetric BLE edema .  Skin: Intact, warm, no rashes.  LABS: PULMONARY  Recent Labs Lab 02/21/14 2151 02/22/14 0110 02/22/14 0458 02/22/14 1741 02/23/14 0401  PHART 7.288* 7.265* 7.316* 7.392 7.375  PCO2ART 37.2 38.6 35.1 30.8* 31.4*  PO2ART 57.0* 110.0* 151.0* 75.0* 218.0*  HCO3 17.8* 16.9* 17.5* 18.8* 17.9*  TCO2 19 18.1 18.6 20 18.9  O2SAT 86.0 96.8 99.1 95.0 99.5    CBC  Recent Labs Lab 02/21/14 1900 02/22/14 0035 02/23/14 0420  HGB 11.4* 10.4* 8.9*  HCT 35.2* 32.8* 27.7*  WBC 14.6* 21.7* 10.8*  PLT 329 334 225    COAGULATION  Recent Labs Lab 02/21/14 1900  INR 1.14  CARDIAC    Recent Labs Lab 02/21/14 1900 02/22/14 0640 02/22/14 1506  TROPONINI <0.30 2.66* 4.24*    Recent Labs Lab 02/21/14 1900  PROBNP 36455.0*     CHEMISTRY  Recent Labs Lab 02/21/14 1900 02/22/14 0035 02/23/14 0420 02/23/14 0500  NA 137 140 137  --   K 3.6* 4.0 4.0  --   CL 101 103 104  --   CO2 17* 20 16*  --   GLUCOSE 131* 160* 116*  --   BUN 42* 43* 60*  --   CREATININE 3.58* 4.14* 5.70*  --   CALCIUM 9.3 8.5 8.6  --   MG  --  1.9  --  1.9  PHOS  --  6.5*  --  5.9*   Estimated Creatinine Clearance: 16.9  ml/min (by C-G formula based on Cr of 5.7).   LIVER  Recent Labs Lab 02/21/14 1900  AST 25  ALT 30  ALKPHOS 152*  BILITOT 0.9  PROT 7.9  ALBUMIN 3.7  INR 1.14     INFECTIOUS  Recent Labs Lab 02/22/14 0035  LATICACIDVEN 1.8     ENDOCRINE CBG (last 3)   Recent Labs  02/23/14 1206 02/23/14 1626 02/23/14 1954  GLUCAP 107* 94 115*     CXR - persistent pulm edema pattern   ASSESSMENT / PLAN:  PULMONARY OETT 10/6 >>> A: Acute hypoxemic respiratory failure Severe acute pulmonary edema  Doubt PE   P:   Cont full vent support - settings reviewed and/or adjusted Cont vent bundle Daily SBT if/when meets criteria  CARDIOVASCULAR CVL R IJ 10/6 >>> A:  HTN crisis  Severe pulmonary edema Acute systolic v diastolic chf Elevated troponin  P:  Wean nicardipine to off maintaining SBP < 170 mmHg Not a candidate for aggressive cardiac intervention presently   RENAL L IJ HD cath 10/08 >>  A:   Acute renal failure with oliguria volume overload CKD P:   Monitor BMET intermittently Monitor I/Os Correct electrolytes as indicated Renal consult for HD  GASTROINTESTINAL A:   Obesity P:   SUP: Pantoprazole. Cont TFs  HEMATOLOGIC A:   ICU acquired anemia P:  DVT px: SCDs Monitor CBC intermittently Transfuse per usual ICU guidelines   INFECTIOUS A:   No overt infection P:   Monitor off abx   ENDOCRINE A:   Hyperglycemia    P:   CBG's q4hr. SSI.  NEUROLOGIC A:   Intracranial hemorrhage with encephalopathy  P:   Per Stroke team Sedation: transition to fentanyl gtt RASS goal: -1 to -2 Daily WUA.  FAMILY Family updates: mother and husband updated at bedside IDT family conference: N/a   TODAY'S SUMMARY: ICH r/t HTN with acute resp failure in setting pulm edema.  Change to lasix gtt.  If no improvement may need renal to see and CRRT for volume removal. Will hold off on cards input for now.     *Care during the described time  interval was provided by me and/or other providers on the critical care team. I have reviewed this patient's available data, including medical history, events of note, physical examination and test results as part of my evaluation.  45 mins CCM time  Billy Fischer, MD;  PCCM service; Mobile [phone]

## 2014-02-23 NOTE — Procedures (Signed)
Hemodialysis Catheter Insertion Procedure Note Lynn Gentry [id number] Jan 08, 1971  Procedure: Insertion of Hemodialysis Catheter Indications: Hemodialysis   Procedure Details Consent: Risks of procedure as well as the alternatives and risks of each were explained to the (patient/caregiver).  Consent for procedure obtained.  Time Out: Verified patient identification, verified procedure, site/side was marked, verified correct patient position, special equipment/implants available, medications/allergies/relevent history reviewed, required imaging and test results available.  Performed  Maximum sterile technique was used including antiseptics, cap, gloves, gown, hand hygiene, mask and sheet. Skin prep: Chlorhexidine; local anesthetic administered A  triple lumen HD catheter was placed in the left internal jugular vein using the Seldinger technique.  Evaluation Blood flow good Complications: No apparent complications Patient did tolerate procedure well. Chest X-ray ordered to verify placement.  CXR: pending.   Procedure performed under direct supervision of Dr. Sung Amabile and with ultrasound guidance for real time vessel cannulation.     Lynn Brim, NP-C [facility] Pgr: [phone] or [phone]   Billy Fischer, MD ; [facility] service Mobile [phone].  After 5:30 PM or weekends, call [phone]  02/23/2014, 11:29 AM

## 2014-02-23 NOTE — Procedures (Signed)
I was present at this dialysis session. I have reviewed the session itself and made appropriate changes.   Qb 200.  UF goal 3L. L IJ nontunneled HD Cath.  BP stable.    Sabra Heck  MD 02/23/2014, 3:37 PM

## 2014-02-23 NOTE — Consult Note (Signed)
Lynn Gentry is an 43 y.o. female referred by Dr Leonidas Romberg   Chief Complaint: Acute on CKD, metabolic acidosis HPI: 19FX BF transferred from [place] after presenting with Rt basal ganglia hemorrhage.  BP was reportedly 226/111.  Unclear as to any hx of HTN though echo shows significant LVH and renal US shows smallish, echogenic kidneys.  Scr 3.58 on admission and now 5.7 with little UO.  CXR showing some pulm edema.  History reviewed. No pertinent past medical history.  History reviewed. No pertinent past surgical history.  No family history on file. Social History:  reports that she has never smoked. She does not have any smokeless tobacco history on file. She reports that she drinks alcohol. She reports that she does not use illicit drugs.  Allergies: No Known Allergies  Medications Prior to Admission  Medication Sig Dispense Refill  . cetirizine (ZYRTEC) 10 MG tablet Take 10 mg by mouth daily as needed for allergies.         Lab Results: UA: 7-10 wbc , 3-6 rbc100 protein  Recent Labs  02/21/14 1900 02/22/14 0035 02/23/14 0420  WBC 14.6* 21.7* 10.8*  HGB 11.4* 10.4* 8.9*  HCT 35.2* 32.8* 27.7*  PLT 329 334 225   BMET  Recent Labs  02/21/14 1900 02/22/14 0035 02/23/14 0420 02/23/14 0500  NA 137 140 137  --   K 3.6* 4.0 4.0  --   CL 101 103 104  --   CO2 17* 20 16*  --   GLUCOSE 131* 160* 116*  --   BUN 42* 43* 60*  --   CREATININE 3.58* 4.14* 5.70*  --   CALCIUM 9.3 8.5 8.6  --   PHOS  --  6.5*  --  5.9*   LFT  Recent Labs  02/21/14 1900  PROT 7.9  ALBUMIN 3.7  AST 25  ALT 30  ALKPHOS 152*  BILITOT 0.9   Ct Head Wo Contrast  02/22/2014   CLINICAL DATA:  Altered mental status.  Pupillary asymmetry.  EXAM: CT HEAD WITHOUT CONTRAST  TECHNIQUE: Contiguous axial images were obtained from the base of the skull through the vertex without intravenous contrast.  COMPARISON:  02/21/2014  FINDINGS: Hemi-ovoid shaped 4.2 by 1.2 cm hemorrhage in the vicinity of  the right putamen with surrounding mild adjacent vasogenic edema, unchanged in size, and without significant midline shift or significant effacement of the basilar cisterns. Fourth ventricle appears patent. No intraventricular hemorrhage is observed currently. No new hemorrhage.  Intracranial structures remain otherwise unremarkable. No calvarial abnormality.  IMPRESSION: 1. Stable appearance of the intraparenchymal hemorrhage in the vicinity of the right putamen, with mild surrounding vasogenic edema. No midline shift, effacement of the basilar cisterns, or hydrocephalus.   Electronically Signed   By: Sherryl Barters M.D.   On: 02/22/2014 14:35   Ct Head Wo Contrast  02/22/2014   CLINICAL DATA:  Acute onset of respiratory distress. Change in mental status. Follow-up known acute intraparenchymal hemorrhage.  EXAM: CT HEAD WITHOUT CONTRAST  TECHNIQUE: Contiguous axial images were obtained from the base of the skull through the vertex without intravenous contrast.  COMPARISON:  CT of the head performed earlier today at 6:18 p.m.  FINDINGS: There has been no significant interval change in the patient's intraparenchymal hemorrhage at the right lateral basal ganglia measuring approximately 4.1 x 1.2 cm, with mild surrounding vasogenic edema. There is mild associated mass effect, with approximately 4 mm of stable leftward midline shift.  Scattered periventricular and subcortical white matter  change likely reflects small vessel ischemic microangiopathy.  The posterior fossa, including the cerebellum, brainstem and fourth ventricle, is within normal limits. The third and lateral ventricles are unremarkable in appearance.  There is no evidence of fracture; visualized osseous structures are unremarkable in appearance. Bilateral proptosis is noted. The paranasal sinuses and mastoid air cells are well-aerated. No significant soft tissue abnormalities are seen.  IMPRESSION: 1. No significant interval change in the patient's  intraparenchymal hemorrhage at the right lateral basal ganglia, measuring 4.1 x 1.2 cm, with mild surrounding vasogenic edema. Mild associated mass effect, with 4 mm of leftward midline shift again seen. 2. Scattered small vessel ischemic microangiopathy. 3. Bilateral proptosis noted.   Electronically Signed   By: Garald Balding M.D.   On: 02/22/2014 01:06   Ct Head Wo Contrast  02/21/2014   CLINICAL DATA:  Patient found unresponsive at home by her son at approximately 1600 hr this afternoon. Code stroke.  EXAM: CT HEAD WITHOUT CONTRAST  TECHNIQUE: Contiguous axial images were obtained from the base of the skull through the vertex without intravenous contrast.  COMPARISON:  None.  FINDINGS: Oval-shaped acute hemorrhage in the lateral right basal ganglia measuring approximately 4.1 x 1.5 cm (image 15). Mild surrounding edema, extending upward into the posterior right frontal lobe. Mild mass effect with shift of the midline to the left approximating 5 mm. No acute hemorrhage or hematoma elsewhere. No evidence of hydrocephalus. No other focal brain parenchymal abnormalities.  No skull fracture or other focal osseous abnormality involving the skull. Visualized paranasal sinuses, bilateral mastoid air cells and bilateral middle ear cavities well-aerated.  IMPRESSION: 1. Acute hemorrhage into the lateral right basal ganglia, likely indicating a hypertensive hemorrhage/hemorrhagic stroke. 2. Mild mass effect with shift of the midline to the left approximating 5 mm. Critical Value/emergent results were called by telephone at the time of interpretation on 02/21/2014 at 6:27 pm to Dr. Milton Ferguson , who verbally acknowledged these results.   Electronically Signed   By: Evangeline Dakin M.D.   On: 02/21/2014 18:29   US Renal Port  02/22/2014   CLINICAL DATA:  Acute renal failure  EXAM: RENAL/URINARY TRACT ULTRASOUND COMPLETE  COMPARISON:  None.  FINDINGS: Right Kidney:  Length: 8.4 cm. No hydronephrosis is seen. The  right kidney is somewhat small and echogenic consistent with chronic renal medical disease.  Left Kidney:  Length: 8.1 cm. No hydronephrosis is noted. The left kidney also is small and echogenic consistent with chronic renal medical disease.  Bladder:  A Foley catheter is present with decompression of the urinary bladder which for limits evaluation.  IMPRESSION: 1. Small echogenic kidneys consistent with chronic renal medical disease. No hydronephrosis. 2. The urinary bladder is decompressed by Foley catheter and cannot be evaluated.   Electronically Signed   By: Ivar Drape M.D.   On: 02/22/2014 08:03   Dg Chest Portable 1 View  02/23/2014   CLINICAL DATA:  Subsequent evaluation for pulmonary edema  EXAM: PORTABLE CHEST - 1 VIEW  COMPARISON:  02/21/2014  FINDINGS: Moderately severe cardiac enlargement. Lines and tubes in unchanged position.  Extensive right pulmonary edema improved, with subsequent development of significant opacity at the right base likely largely due to pleural effusion and associated atelectasis. Left lung very difficult to characterize as the patient is rotated towards the left with heart shadow superimposed over the left lung.  IMPRESSION: Limited study as described above. Persistent but significantly improved right pulmonary edema with probable development of significant right effusion.  Electronically Signed   By: Skipper Cliche M.D.   On: 02/23/2014 07:42   Dg Chest Port 1 View  02/22/2014   CLINICAL DATA:  Interval placement of endotracheal tube and right IJ line. Subsequent encounter for stroke.  EXAM: PORTABLE CHEST - 1 VIEW  COMPARISON:  One-view chest 02/21/2014  FINDINGS: The heart is enlarged. The patient is now intubated. The endotracheal tube terminates 3 cm above the carina. A right IJ line is in the distal SVC, above the cavoatrial junction. Bilateral interstitial edema and effusions are again noted. Bibasilar airspace disease is present.  IMPRESSION: 1. Satisfactory  positioning of endotracheal tube and right IJ line. 2. Cardiomegaly with persistent diffuse interstitial and airspace disease likely reflecting edema and congestive heart failure. 3. Bilateral pleural effusions and basilar airspace disease. This likely reflects atelectasis.   Electronically Signed   By: Lawrence Santiago M.D.   On: 02/22/2014 00:04   Dg Chest Portable 1 View  02/21/2014   CLINICAL DATA:  Cerebrovascular accident beginning 3-4 hr ago. Confusion.  EXAM: PORTABLE CHEST - 1 VIEW  COMPARISON:  None.  FINDINGS: Exam is limited by body habitus and underpenetration.  The heart is mildly enlarged. Asymmetric airspace process, right greater than left. Possible asymmetric pulmonary edema without definite pleural effusions.  IMPRESSION: Mild cardiac enlargement and possible asymmetric pulmonary edema. However, exam is limited by body habitus.   Electronically Signed   By: Kalman Jewels M.D.   On: 02/21/2014 19:38   Dg Abd Portable 1v  02/22/2014   CLINICAL DATA:  Initial encounter for NG tube placement  EXAM: PORTABLE ABDOMEN - 1 VIEW  COMPARISON:  None.  FINDINGS: 1320 hrs. Lung volumes are low with bilateral airspace opacities diffusely. The cardio pericardial silhouette is enlarged. Right IJ central line tip overlies 3 upper right atrium. NG tube tip is in the distal stomach.  IMPRESSION: NG tube tip is positioned in the distal stomach.   Electronically Signed   By: Misty Stanley M.D.   On: 02/22/2014 13:49    ROS: unobtainable due intubation  PHYSICAL EXAM: Blood pressure 149/64, pulse 97, temperature 98.6 F (37 C), temperature source Oral, resp. rate 34, height 5\' 4"  (1.626 m), weight 125.5 kg (276 lb 10.8 oz), SpO2 95.00%. HEENT: PERRL, ET in place. Hirsute face NECK:Rt IJ triple lumen LUNGS:Bil crackles R>L CARDIAC:RRR ABD:+ BS soft, non distended, no overt HSM ZSW:FUXNA edema NEURO:Sedated  Assessment: 1. Acute on CKD sec HTN.  Since she does have some white and red cells in urine  will check serologies 2. Rt basal ganglia hemorrhage 3. HTN PLAN: 1. Plan HD today 2 hr UF and 1 hr HD 2. Check ANA, ANCA, anti-gbm, comp, SPEP 3. Recheck labs in AM   Mairin Lindsley T 02/23/2014, 10:48 AM

## 2014-02-23 NOTE — Progress Notes (Signed)
STROKE TEAM PROGRESS NOTE   HISTORY Lynn Gentry is an 43 y.o. female with no previously documented medical disorder who was brought to [facility] with new onset slurred speech and left-sided weakness. Patient went to work as usual until 1500. She was found by her son at home at 1600 unable to get up out of a chair with left sided weakness, slurred speech. Patient was last known well around 3 PM this afternoon 02/21/2014. CT scan of her head showed an acute 4.1 x 1.5 cm right basal ganglia hemorrhage with mild mass effect and right to left shift of 5 mm. Patient's blood pressure was markedly elevated at 226/111. She was started on Cardene drip and blood pressure responded well. She has shown continual respiratory difficulty. She was initially given oxygen by nasal cannula followed by NBR, and subsequently placed on BiPAP. She continued to have oxygen saturations only in the high 80s to low 90s on 100% O2, with a respiratory rates in the 40s. ABG showed pH of 2.82, PCO2 37 PO2 of 57. Chest x-ray showed mild cardiomegaly with possible asymmetric pulmonary edema. BNP was 36,455. Because of deteriorating respiratory status intubation and mechanical ventilation was elected. CCM service was consulted. Patient was not administered TPA secondary to ICH. She was admitted to the neuro ICU  for further evaluation and treatment.   SUBJECTIVE (INTERVAL HISTORY) Husband and mother are at the bedside.  Neurologically stable but yet has pulm edema, poor urine output, renal and respiratory failure and likely need hemodialysis.   OBJECTIVE Temp:  [97.9 F (36.6 C)-99.1 F (37.3 C)] 98.6 F (37 C) (10/08 0800) Pulse Rate:  [59-100] 90 (10/08 1119) Cardiac Rhythm:  [-] Normal sinus rhythm (10/08 0800) Resp:  [0-35] 21 (10/08 1119) BP: (109-162)/(55-74) 149/64 mmHg (10/08 0930) SpO2:  [85 %-100 %] 95 % (10/08 1119) FiO2 (%):  [50 %-70 %] 60 % (10/08 1119)   Recent Labs Lab 02/22/14 1612  02/22/14 1947 02/22/14 2339 02/23/14 0259 02/23/14 0806  GLUCAP 96 107* 130* 114* 100*    Recent Labs Lab 02/21/14 1900 02/22/14 0035 02/23/14 0420 02/23/14 0500  NA 137 140 137  --   K 3.6* 4.0 4.0  --   CL 101 103 104  --   CO2 17* 20 16*  --   GLUCOSE 131* 160* 116*  --   BUN 42* 43* 60*  --   CREATININE 3.58* 4.14* 5.70*  --   CALCIUM 9.3 8.5 8.6  --   MG  --  1.9  --  1.9  PHOS  --  6.5*  --  5.9*    Recent Labs Lab 02/21/14 1900  AST 25  ALT 30  ALKPHOS 152*  BILITOT 0.9  PROT 7.9  ALBUMIN 3.7    Recent Labs Lab 02/21/14 1900 02/22/14 0035 02/23/14 0420  WBC 14.6* 21.7* 10.8*  NEUTROABS 12.4* 19.1*  --   HGB 11.4* 10.4* 8.9*  HCT 35.2* 32.8* 27.7*  MCV 85.0 88.6 86.3  PLT 329 334 225    Recent Labs Lab 02/21/14 1900 02/22/14 0640 02/22/14 1506  TROPONINI <0.30 2.66* 4.24*    Recent Labs  02/21/14 1900  LABPROT 14.6  INR 1.14    Recent Labs  02/22/14 0651  COLORURINE YELLOW  LABSPEC 1.009  PHURINE 5.0  GLUCOSEU NEGATIVE  HGBUR MODERATE*  BILIRUBINUR NEGATIVE  KETONESUR NEGATIVE  PROTEINUR 100*  UROBILINOGEN 0.2  NITRITE NEGATIVE  LEUKOCYTESUR SMALL*    No results found for this basename: chol,  trig,  hdl,  cholhdl,  vldl,  ldlcalc   No results found for this basename: HGBA1C   No results found for this basename: labopia,  cocainscrnur,  labbenz,  amphetmu,  thcu,  labbarb    No results found for this basename: ETH,  in the last 168 hours  Ct Head Wo Contrast 02/22/2014   1. No significant interval change in the patient's intraparenchymal hemorrhage at the right lateral basal ganglia, measuring 4.1 x 1.2 cm, with mild surrounding vasogenic edema. Mild associated mass effect, with 4 mm of leftward midline shift again seen. 2. Scattered small vessel ischemic microangiopathy. 3. Bilateral proptosis noted.    02/21/2014   1. Acute hemorrhage into the lateral right basal ganglia, likely indicating a hypertensive  hemorrhage/hemorrhagic stroke. 2. Mild mass effect with shift of the midline to the left approximating 5 mm. Critical   [place] Renal Port 02/22/2014    1. Small echogenic kidneys consistent with chronic renal medical disease. No hydronephrosis. 2. The urinary bladder is decompressed by Foley catheter and cannot be evaluated.     Dg Chest Port 1 View 02/22/2014    1. Satisfactory positioning of endotracheal tube and right IJ line. 2. Cardiomegaly with persistent diffuse interstitial and airspace disease likely reflecting edema and congestive heart failure. 3. Bilateral pleural effusions and basilar airspace disease. This likely reflects atelectasis.    02/21/2014    Mild cardiac enlargement and possible asymmetric pulmonary edema. However, exam is limited by body habitus.      PHYSICAL EXAM Young obese Caucasian lady not in distress..Intubatedt. Afebrile. Head is nontraumatic. Neck is supple without bruit.    l. Cardiac exam soft ejection murmur .no gallop. Lungs bilateral rhonchi to auscultation. Distal pulses are well felt. Neurological Exam : intubated sedated on propofol. Slight downgaze preference. Pupils 3 mm reactive. Fundi not visualized. Left lower face weakness. Moves rt more than left upper extremity and localizes. Moves RLE well and Left LE minimally to painful stimuli. Left Plantar upgoing and right down going. ASSESSMENT/PLAN Lynn Gentry is a 43 y.o. female with no documented medical history found with new onset slurred speech and left-sided weakness.   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, nondominant  with cytotoxic edema, mild transfalcine herniation, pulmonary edema, respiratory and renal failure  MRI  pending  MRA  pending  Carotid Doppler  pending  2D Echo  pending  HgbA1c ordered  SCDs for VTE prophylaxis       Bedrest  Resultant VDRF, left hemiparesis  no antithrombotics prior to admission  Ongoing aggressive risk  factor management  Therapy recommendations:  pending  Disposition:  pending  Acute Respiratory Failure with severe acute pulmonary edema  Intubated  Sedated  CCM following  Diuresing, plan change to Lasix drip  Check d-dimer  Leukocytosis  White blood cells 21.7  Monitoring off antibiotics  Malignant Hypertension  BP 226/111 at onset  Home meds:   none  SBP goal < 140 due to presence of end organ damage  On cardene drip  BP 106-226/54-111 past 24h (02/23/2014 @ 11:55 AM)  Unstable  Elevated troponins  2.66  Continue trending  Acute renal failure with volume overload, suspect baseline chronic kidney disease  Diuresis and management per critical care  Other Stroke Risk Factors ETOH use   Morbid Obesity, Body mass index is 47.47 kg/(m^2).   Cancel MRI scan of brain as patient cannot lay flat due to pulmonary edema.  Hospital day # 2  SHARON  BIBY, MSN, RN, ANVP-BC, ANP-BC, GNP-BC [facility] Pager: [phone] 02/23/2014 11:55 AM  I have personally examined this patient, reviewed notes, independently viewed imaging studies, participated in medical decision making and plan of care. I have made any additions or clarifications directly to the above note. Agree with note above. Maintain strict control of blood pressure and close neurological monitoring.Delia Heady, MD Medical Director [facility] Pager: [phone] 02/23/2014 11:55 AM  This patient is critically ill and at significant risk of neurological worsening, death and care requires constant monitoring of vital signs, hemodynamics,respiratory and cardiac monitoring,review of multiple databases, neurological assessment, discussion with family, other specialists and medical decision making of high complexity.I have made any additions or clarifications directly to the above note.D/w Dr Bard Herbert, husband and mother x 15 minutes  I spent of neurocritical care time  in the  care of  this patient. Delia Heady, MD   To contact Stroke Continuity provider, please refer to [email or web address]. After hours, contact General Neurology

## 2014-02-24 ENCOUNTER — Inpatient Hospital Stay (HOSPITAL_COMMUNITY): Payer: 59

## 2014-02-24 LAB — GLUCOSE, CAPILLARY
GLUCOSE-CAPILLARY: 111 mg/dL — AB (ref 70–99)
GLUCOSE-CAPILLARY: 116 mg/dL — AB (ref 70–99)
GLUCOSE-CAPILLARY: 127 mg/dL — AB (ref 70–99)
Glucose-Capillary: 111 mg/dL — ABNORMAL HIGH (ref 70–99)
Glucose-Capillary: 116 mg/dL — ABNORMAL HIGH (ref 70–99)
Glucose-Capillary: 141 mg/dL — ABNORMAL HIGH (ref 70–99)

## 2014-02-24 LAB — CBC
HEMATOCRIT: 27.1 % — AB (ref 36.0–46.0)
Hemoglobin: 8.6 g/dL — ABNORMAL LOW (ref 12.0–15.0)
MCH: 27 pg (ref 26.0–34.0)
MCHC: 31.7 g/dL (ref 30.0–36.0)
MCV: 85.2 fL (ref 78.0–100.0)
Platelets: 215 10*3/uL (ref 150–400)
RBC: 3.18 MIL/uL — ABNORMAL LOW (ref 3.87–5.11)
RDW: 15.1 % (ref 11.5–15.5)
WBC: 13.5 10*3/uL — ABNORMAL HIGH (ref 4.0–10.5)

## 2014-02-24 LAB — COMPREHENSIVE METABOLIC PANEL
ALBUMIN: 2.8 g/dL — AB (ref 3.5–5.2)
ALK PHOS: 112 U/L (ref 39–117)
ALT: 16 U/L (ref 0–35)
AST: 20 U/L (ref 0–37)
Anion gap: 18 — ABNORMAL HIGH (ref 5–15)
BILIRUBIN TOTAL: 0.6 mg/dL (ref 0.3–1.2)
BUN: 67 mg/dL — ABNORMAL HIGH (ref 6–23)
CHLORIDE: 102 meq/L (ref 96–112)
CO2: 20 mEq/L (ref 19–32)
Calcium: 8.4 mg/dL (ref 8.4–10.5)
Creatinine, Ser: 5.84 mg/dL — ABNORMAL HIGH (ref 0.50–1.10)
GFR calc Af Amer: 9 mL/min — ABNORMAL LOW (ref >90)
GFR calc non Af Amer: 8 mL/min — ABNORMAL LOW (ref >90)
Glucose, Bld: 137 mg/dL — ABNORMAL HIGH (ref 70–99)
POTASSIUM: 4 meq/L (ref 3.7–5.3)
SODIUM: 140 meq/L (ref 137–147)
Total Protein: 6.7 g/dL (ref 6.0–8.3)

## 2014-02-24 LAB — C4 COMPLEMENT: Complement C4, Body Fluid: 15 mg/dL — ABNORMAL LOW (ref 10–40)

## 2014-02-24 LAB — PARATHYROID HORMONE, INTACT (NO CA): PTH: 731 pg/mL — AB (ref 14–64)

## 2014-02-24 LAB — C3 COMPLEMENT: C3 Complement: 113 mg/dL (ref 90–180)

## 2014-02-24 LAB — MPO/PR-3 (ANCA) ANTIBODIES
Myeloperoxidase Abs: <1
Serine Protease 3: <1

## 2014-02-24 LAB — ANA: Anti Nuclear Antibody(ANA): NEGATIVE

## 2014-02-24 LAB — GLOMERULAR BASEMENT MEMBRANE ANTIBODIES: GBM Ab: <1

## 2014-02-24 MED ORDER — SENNOSIDES-DOCUSATE SODIUM 8.6-50 MG PO TABS
1.0000 | ORAL_TABLET | Freq: Two times a day (BID) | ORAL | Status: DC
  Administered 2014-02-24 – 2014-03-08 (×12): 1
  Filled 2014-02-24 (×21): qty 1

## 2014-02-24 MED ORDER — PRO-STAT SUGAR FREE PO LIQD
60.0000 mL | Freq: Two times a day (BID) | ORAL | Status: DC
  Administered 2014-02-24 – 2014-03-08 (×22): 60 mL
  Filled 2014-02-24 (×27): qty 60

## 2014-02-24 MED ORDER — METOPROLOL TARTRATE 1 MG/ML IV SOLN
2.5000 mg | INTRAVENOUS | Status: DC | PRN
Start: 2014-02-24 — End: 2014-03-21
  Administered 2014-02-28 – 2014-03-20 (×3): 5 mg via INTRAVENOUS
  Filled 2014-02-24 (×3): qty 5

## 2014-02-24 MED ORDER — PANTOPRAZOLE SODIUM 40 MG PO PACK
40.0000 mg | PACK | Freq: Every day | ORAL | Status: DC
  Administered 2014-02-24 – 2014-03-21 (×25): 40 mg
  Filled 2014-02-24 (×27): qty 20

## 2014-02-24 MED ORDER — HYDRALAZINE HCL 20 MG/ML IJ SOLN
10.0000 mg | INTRAMUSCULAR | Status: DC | PRN
  Administered 2014-02-25: 10 mg via INTRAVENOUS
  Administered 2014-02-25: 20 mg via INTRAVENOUS
  Administered 2014-02-26: 10 mg via INTRAVENOUS
  Administered 2014-02-27 – 2014-03-15 (×5): 20 mg via INTRAVENOUS
  Filled 2014-02-24 (×10): qty 1

## 2014-02-24 MED ORDER — FUROSEMIDE 10 MG/ML IJ SOLN
160.0000 mg | Freq: Two times a day (BID) | INTRAVENOUS | Status: DC
  Administered 2014-02-24 (×2): 160 mg via INTRAVENOUS
  Filled 2014-02-24 (×3): qty 16

## 2014-02-24 MED ORDER — SODIUM CHLORIDE 0.9 % IV SOLN
1020.0000 mg | Freq: Once | INTRAVENOUS | Status: AC
  Administered 2014-02-24: 1020 mg via INTRAVENOUS
  Filled 2014-02-24: qty 34

## 2014-02-24 MED ORDER — ACETAMINOPHEN 325 MG PO TABS
650.0000 mg | ORAL_TABLET | Freq: Four times a day (QID) | ORAL | Status: DC | PRN
  Administered 2014-02-24 – 2014-03-17 (×11): 650 mg via ORAL
  Filled 2014-02-24 (×12): qty 2

## 2014-02-24 MED ORDER — VITAL HIGH PROTEIN PO LIQD
1000.0000 mL | ORAL | Status: DC
  Administered 2014-02-24 – 2014-02-26 (×3): 1000 mL
  Administered 2014-02-27: 15:00:00
  Administered 2014-02-28: 1000 mL
  Administered 2014-02-28 (×2)
  Administered 2014-03-01: 1000 mL
  Administered 2014-03-03: 07:00:00
  Administered 2014-03-03: 1000 mL
  Administered 2014-03-03 (×3)
  Administered 2014-03-04 – 2014-03-06 (×3): 1000 mL
  Administered 2014-03-06: 16:00:00
  Administered 2014-03-07: 1000 mL
  Filled 2014-02-24 (×18): qty 1000

## 2014-02-24 NOTE — Progress Notes (Signed)
NUTRITION FOLLOW-UP  DOCUMENTATION CODES Per approved criteria  -Morbid Obesity   INTERVENTION: Increase Vital High Protein to 40 ml/hr via OG tube   60 ml Prostat BID.    MVI daily  Tube feeding regimen provides 1360 kcal (63% of needs), 144 grams of protein, and 802 ml of H2O.    NUTRITION DIAGNOSIS: Inadequate oral intake related to inability to eat as evidenced by NPO status; ongoing.   Goal: Enteral nutrition to provide 60-70% of estimated calorie needs (22-25 kcals/kg ideal body weight) and 100% of estimated protein needs, based on ASPEN guidelines for permissive underfeeding in critically ill obese individuals; met.   Monitor:  Respiratory status, propofol infusion, TF tolerance, labs   ASSESSMENT: Pt admitted 10/6 with left sided weakness, slurred speech, inability to stand up. In ED, found to have acute lateral right basal ganglia ICH with mild mass effect and 22m MLS. Pt developed respiratory failure and was intubated.   Patient is currently intubated on ventilator support MV: 10 L/min Temp (24hrs), Avg:98.2 F (36.8 C), Min:97.2 F (36.2 C), Max:99.5 F (37.5 C)  Propofol: off  Pt had HD catheter placed 10/8 and started HD. Plans for repeat HD 10/9.  Pt discussed during ICU rounds and with RN.  Phosphorus elevated.   Height: Ht Readings from Last 1 Encounters:  02/21/14 _0  (1.626 m)    Weight: Wt Readings from Last 1 Encounters:  02/24/14 271 lb 10.8 oz (123.23 kg)  Admission weight: 276 lb (125.5 kg) 10/6  BMI:  Body mass index is 46.61 kg/(m^2).  Estimated Nutritional Needs: Kcal: 2171 Protein: >/= 136 grams Fluid: > 2 L/day  Skin: intact  Diet Order:     Intake/Output Summary (Last 24 hours) at 02/24/14 1122 Last data filed at 02/24/14 0800  Gross per 24 hour  Intake 1378.29 ml  Output   2700 ml  Net -1321.71 ml    Last BM: PTA   Labs:   Recent Labs Lab 02/21/14 1900 02/22/14 0035 02/23/14 0420 02/23/14 0500  02/24/14 0450  NA 137 140 137  --  140  K 3.6* 4.0 4.0  --  4.0  CL 101 103 104  --  102  CO2 17* 20 16*  --  20  BUN 42* 43* 60*  --  67*  CREATININE 3.58* 4.14* 5.70*  --  5.84*  CALCIUM 9.3 8.5 8.6  --  8.4  MG  --  1.9  --  1.9  --   PHOS  --  6.5*  --  5.9*  --   GLUCOSE 131* 160* 116*  --  137*    CBG (last 3)   Recent Labs  02/24/14 0004 02/24/14 0409 02/24/14 1055  GLUCAP 111* 111* 116*    Scheduled Meds: . antiseptic oral rinse  7 mL Mouth Rinse QID  . chlorhexidine  15 mL Mouth Rinse BID  . Chlorhexidine Gluconate Cloth  6 each Topical Q0600  . feeding supplement (PRO-STAT SUGAR FREE 64)  60 mL Per Tube TID  . feeding supplement (VITAL HIGH PROTEIN)  1,000 mL Per Tube Q24H  . ferumoxytol  1,020 mg Intravenous Once  . furosemide  160 mg Intravenous BID  . insulin aspart  0-15 Units Subcutaneous 6 times per day  . multivitamin  5 mL Per Tube Daily  . mupirocin ointment  1 application Nasal BID  . pantoprazole (PROTONIX) IV  40 mg Intravenous QHS  . senna-docusate  1 tablet Oral BID    Continuous Infusions: .  fentaNYL infusion INTRAVENOUS Stopped (02/24/14 0753)  . niCARDipine 13 mg/hr (02/24/14 0800)    [place], [place], CNSC [phone] Pager [phone] After Hours Pager

## 2014-02-24 NOTE — Progress Notes (Signed)
STROKE TEAM PROGRESS NOTE   HISTORY Lynn Gentry is an 43 y.o. female with no previously documented medical disorder who was brought to [facility] with new onset slurred speech and left-sided weakness. Patient went to work as usual until 1500. She was found by her son at home at 1600 unable to get up out of a chair with left sided weakness, slurred speech. Patient was last known well around 3 PM this afternoon 02/21/2014. CT scan of her head showed an acute 4.1 x 1.5 cm right basal ganglia hemorrhage with mild mass effect and right to left shift of 5 mm. Patient's blood pressure was markedly elevated at 226/111. She was started on Cardene drip and blood pressure responded well. She has shown continual respiratory difficulty. She was initially given oxygen by nasal cannula followed by NBR, and subsequently placed on BiPAP. She continued to have oxygen saturations only in the high 80s to low 90s on 100% O2, with a respiratory rates in the 40s. ABG showed pH of 2.82, PCO2 37 PO2 of 57. Chest x-ray showed mild cardiomegaly with possible asymmetric pulmonary edema. BNP was 36,455. Because of deteriorating respiratory status intubation and mechanical ventilation was elected. CCM service was consulted. Patient was not administered TPA secondary to ICH. She was admitted to the neuro ICU  for further evaluation and treatment.   SUBJECTIVE (INTERVAL HISTORY) Husband and mother are at the bedside.  Neurologically stable but yet has pulm edema, poor urine output, renal and respiratory failure and plan hemodialysis today. Had neurological worsening with new rt sided weakness but stat head Ct showed stable resolving hematoma without any increase mass effect or midline shift   OBJECTIVE Temp:  [97.2 F (36.2 C)-99.5 F (37.5 C)] 99.1 F (37.3 C) (10/09 1000) Pulse Rate:  [91-110] 104 (10/09 1439) Cardiac Rhythm:  [-] Normal sinus rhythm (10/08 2000) Resp:  [19-28] 25 (10/09 1439) BP:  (112-174)/(55-76) 163/56 mmHg (10/09 1439) SpO2:  [91 %-100 %] 99 % (10/09 1439) FiO2 (%):  [50 %-60 %] 50 % (10/09 1145) Weight:  [271 lb 10.8 oz (123.23 kg)-287 lb 4.2 oz (130.3 kg)] 271 lb 10.8 oz (123.23 kg) (10/09 0400)   Recent Labs Lab 02/23/14 1954 02/24/14 0004 02/24/14 0409 02/24/14 1055 02/24/14 1151  GLUCAP 115* 111* 111* 116* 127*    Recent Labs Lab 02/21/14 1900 02/22/14 0035 02/23/14 0420 02/23/14 0500 02/24/14 0450  NA 137 140 137  --  140  K 3.6* 4.0 4.0  --  4.0  CL 101 103 104  --  102  CO2 17* 20 16*  --  20  GLUCOSE 131* 160* 116*  --  137*  BUN 42* 43* 60*  --  67*  CREATININE 3.58* 4.14* 5.70*  --  5.84*  CALCIUM 9.3 8.5 8.6  --  8.4  MG  --  1.9  --  1.9  --   PHOS  --  6.5*  --  5.9*  --     Recent Labs Lab 02/21/14 1900 02/24/14 0450  AST 25 20  ALT 30 16  ALKPHOS 152* 112  BILITOT 0.9 0.6  PROT 7.9 6.7  ALBUMIN 3.7 2.8*    Recent Labs Lab 02/21/14 1900 02/22/14 0035 02/23/14 0420 02/24/14 0450  WBC 14.6* 21.7* 10.8* 13.5*  NEUTROABS 12.4* 19.1*  --   --   HGB 11.4* 10.4* 8.9* 8.6*  HCT 35.2* 32.8* 27.7* 27.1*  MCV 85.0 88.6 86.3 85.2  PLT 329 334 225 215    Recent  Labs Lab 02/21/14 1900 02/22/14 0640 02/22/14 1506  TROPONINI <0.30 2.66* 4.24*    Recent Labs  02/21/14 1900  LABPROT 14.6  INR 1.14    Recent Labs  02/22/14 0651  COLORURINE YELLOW  LABSPEC 1.009  PHURINE 5.0  GLUCOSEU NEGATIVE  HGBUR MODERATE*  BILIRUBINUR NEGATIVE  KETONESUR NEGATIVE  PROTEINUR 100*  UROBILINOGEN 0.2  NITRITE NEGATIVE  LEUKOCYTESUR SMALL*    No results found for this basename: chol,  trig,  hdl,  cholhdl,  vldl,  ldlcalc   Lab Results  Component Value Date   HGBA1C 5.8* 02/23/2014   No results found for this basename: labopia,  cocainscrnur,  labbenz,  amphetmu,  thcu,  labbarb    No results found for this basename: ETH,  in the last 168 hours  Ct Head Wo Contrast 02/22/2014   1. No significant interval change  in the patient's intraparenchymal hemorrhage at the right lateral basal ganglia, measuring 4.1 x 1.2 cm, with mild surrounding vasogenic edema. Mild associated mass effect, with 4 mm of leftward midline shift again seen. 2. Scattered small vessel ischemic microangiopathy. 3. Bilateral proptosis noted.    02/21/2014   1. Acute hemorrhage into the lateral right basal ganglia, likely indicating a hypertensive hemorrhage/hemorrhagic stroke. 2. Mild mass effect with shift of the midline to the left approximating 5 mm. Critical   US Renal Port 02/22/2014    1. Small echogenic kidneys consistent with chronic renal medical disease. No hydronephrosis. 2. The urinary bladder is decompressed by Foley catheter and cannot be evaluated.     Dg Chest Port 1 View 02/22/2014    1. Satisfactory positioning of endotracheal tube and right IJ line. 2. Cardiomegaly with persistent diffuse interstitial and airspace disease likely reflecting edema and congestive heart failure. 3. Bilateral pleural effusions and basilar airspace disease. This likely reflects atelectasis.    02/21/2014    Mild cardiac enlargement and possible asymmetric pulmonary edema. However, exam is limited by body habitus.      PHYSICAL EXAM Young obese Caucasian lady not in distress..Intubated. Afebrile. Head is nontraumatic. Neck is supple without bruit.    l. Cardiac exam soft ejection murmur .no gallop. Lungs bilateral rhonchi to auscultation. Distal pulses are well felt. Neurological Exam : intubated sedated on Fentanyl. Slight downgaze preference. Pupils 3 mm reactive. Fundi not visualized. Left lower face weakness. Moves rt upper extremity    less than left today and  Mainly to sternal rub   and localizes. Moves RLE   and Left LE minimally to painful stimuli. Left Plantar upgoing and right down going. ASSESSMENT/PLAN Lynn Gentry is a 43 y.o. female with no documented medical history found with new onset slurred speech and left-sided weakness.    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 - Right vertebral artery not imaged due to central line dressing. Left vertebral artery not imaged due to patient position   2D Echo  Left ventricle: The cavity size was normal. There was severe concentric hypertrophy. Systolic function was normal. The estimated ejection fraction was in the range of 60% to 65%. Wall motion was normal; there were no regional wall motion abnormalities.     HgbA1c 5.8  SCDs for VTE prophylaxis  Bedrest  Resultant VDRF, left hemiparesis  no antithrombotics prior to admission  Ongoing aggressive risk factor management  Therapy recommendations:  pending  Disposition:  pending  Acute Respiratory Failure with severe acute pulmonary edema  Intubated  Sedated  CCM following  Diuresing, plan change to Lasix drip  Check d-dimer  Leukocytosis  White blood cells 21.7  Monitoring off antibiotics  Malignant Hypertension  BP 226/111 at onset  Home meds:   none  SBP goal < 140 due to presence of end organ damage  On cardene drip  BP 106-226/54-111 past 24h (02/24/2014 @ 2:45 PM)  Unstable  Elevated troponins  2.66  Continue trending  Acute renal failure with volume overload, suspect baseline chronic kidney disease  Diuresis and management per critical care  Other Stroke Risk Factors ETOH use   Morbid Obesity, Body mass index is 46.61 kg/(m^2).   Cancel MRI scan of brain as patient cannot lay flat due to pulmonary edema.  Hospital day # 3  Annie MainSHARON BIBY, MSN, RN, ANVP-BC, ANP-BC, Lawernce IonGNP-BC [facility] Pager:  [phone] 02/24/2014 2:45 PM  I have personally examined this patient, reviewed notes, independently viewed imaging studies, participated in medical decision making and plan of care. I have made any additions or clarifications directly to the above note. Agree with note above. Maintain strict control of blood pressure and close neurological monitoring. Delia HeadyPramod Sethi, MD Medical Director [facility] Pager: [phone] 02/24/2014 2:45 PM  This patient is critically ill and at significant risk of neurological worsening, death and care requires constant monitoring of vital signs, hemodynamics,respiratory and cardiac monitoring,review of multiple databases, neurological assessment, discussion with family, other specialists and medical decision making of high complexity.I have made any additions or clarifications directly to the above note .D/w Dr Bard HerbertSimmonds,  [facility] as primary and stroke team will follow on consults.  I spent 30 minutes of neurocritical care time  in the care of  this patient. Delia HeadyPramod Sethi, MD   To contact Stroke Continuity provider, please refer to [email or web address]. After hours, contact General Neurology

## 2014-02-24 NOTE — Progress Notes (Signed)
S: intubated O:BP 145/59  Pulse 96  Temp(Src) 98.1 F (36.7 C) (Oral)  Resp 23  Ht 5\' 4"  (1.626 m)  Wt 123.23 kg (271 lb 10.8 oz)  BMI 46.61 kg/m2  SpO2 93%  Intake/Output Summary (Last 24 hours) at 02/24/14 0934 Last data filed at 02/24/14 0800  Gross per 24 hour  Intake 1577.25 ml  Output   2650 ml  Net -1072.75 ml   Weight change:  Gen: intubated and sedated CVS:RRR Resp: bil crackles.  Scattered rhonchi Abd:+ BS ND Ext: tr edema NEURO: Sedated   . antiseptic oral rinse  7 mL Mouth Rinse QID  . chlorhexidine  15 mL Mouth Rinse BID  . Chlorhexidine Gluconate Cloth  6 each Topical Q0600  . feeding supplement (PRO-STAT SUGAR FREE 64)  60 mL Per Tube TID  . feeding supplement (VITAL HIGH PROTEIN)  1,000 mL Per Tube Q24H  . insulin aspart  0-15 Units Subcutaneous 6 times per day  . multivitamin  5 mL Per Tube Daily  . mupirocin ointment  1 application Nasal BID  . pantoprazole (PROTONIX) IV  40 mg Intravenous QHS  . senna-docusate  1 tablet Oral BID   Ct Head Wo Contrast  02/24/2014   CLINICAL DATA:  Subsequent encounter for intracranial hemorrhage.  EXAM: CT HEAD WITHOUT CONTRAST  TECHNIQUE: Contiguous axial images were obtained from the base of the skull through the vertex without intravenous contrast.  COMPARISON:  02/22/2014  FINDINGS: The right hemispheric intra axial hematoma persists within the basal ganglia. This measures 4.1 x 1.1 cm today compared to 4.2 x 1.2 cm previously. A small rim of surrounding edema is stable. No substantial mass effect.  No evidence for midline shift. No evidence for involving hydrocephalus. No abnormal extra-axial fluid collection.  Visualized portions of the paranasal sinuses are clear. Mastoid air cells are clear.  IMPRESSION: Stable appearance of the hematoma within the right basal ganglia. No new or progressive findings.   Electronically Signed   By: Kennith CenterEric  Mansell M.D.   On: 02/24/2014 09:23   Ct Head Wo Contrast  02/22/2014   CLINICAL  DATA:  Altered mental status.  Pupillary asymmetry.  EXAM: CT HEAD WITHOUT CONTRAST  TECHNIQUE: Contiguous axial images were obtained from the base of the skull through the vertex without intravenous contrast.  COMPARISON:  02/21/2014  FINDINGS: Hemi-ovoid shaped 4.2 by 1.2 cm hemorrhage in the vicinity of the right putamen with surrounding mild adjacent vasogenic edema, unchanged in size, and without significant midline shift or significant effacement of the basilar cisterns. Fourth ventricle appears patent. No intraventricular hemorrhage is observed currently. No new hemorrhage.  Intracranial structures remain otherwise unremarkable. No calvarial abnormality.  IMPRESSION: 1. Stable appearance of the intraparenchymal hemorrhage in the vicinity of the right putamen, with mild surrounding vasogenic edema. No midline shift, effacement of the basilar cisterns, or hydrocephalus.   Electronically Signed   By: Herbie BaltimoreWalt  Liebkemann M.D.   On: 02/22/2014 14:35   Dg Chest Port 1 View  02/24/2014   CLINICAL DATA:  Pulmonary edema.  EXAM: PORTABLE CHEST - 1 VIEW  COMPARISON:  02/23/2014 and 02/21/2014  FINDINGS: Endotracheal tube, 2 central venous catheters, and NG tube appear in good position. There is recurrent pulmonary vascular congestion with perihilar haziness consistent with pulmonary edema.  IMPRESSION: Recurrent pulmonary vascular congestion and pulmonary edema.   Electronically Signed   By: Geanie CooleyJim  Maxwell M.D.   On: 02/24/2014 07:55   Dg Chest Port 1 View  02/23/2014   CLINICAL  DATA:  Central catheter placement  EXAM: PORTABLE CHEST - 1 VIEW  COMPARISON:  February 23, 2014  FINDINGS: The new central catheter has been placed from a left-sided approach with its tip is at the cavoatrial junction. Endotracheal tube tip is 4.8 cm above the carina. Right jugular catheter tip is at the cavoatrial junction. Nasogastric tube tip and side port are below the diaphragm. No pneumothorax. There is persistent cardiomegaly. There is  consolidation in the left base. The right lung is clear. Pulmonary vascularity is normal. No adenopathy.  IMPRESSION: Tube and catheter positions as described without pneumothorax. Stable cardiomegaly. Left lower lobe consolidation persists. Right lung clear.   Electronically Signed   By: Bretta Bang M.D.   On: 02/23/2014 12:36   Dg Chest Portable 1 View  02/23/2014   CLINICAL DATA:  Subsequent evaluation for pulmonary edema  EXAM: PORTABLE CHEST - 1 VIEW  COMPARISON:  02/21/2014  FINDINGS: Moderately severe cardiac enlargement. Lines and tubes in unchanged position.  Extensive right pulmonary edema improved, with subsequent development of significant opacity at the right base likely largely due to pleural effusion and associated atelectasis. Left lung very difficult to characterize as the patient is rotated towards the left with heart shadow superimposed over the left lung.  IMPRESSION: Limited study as described above. Persistent but significantly improved right pulmonary edema with probable development of significant right effusion.   Electronically Signed   By: Esperanza Heir M.D.   On: 02/23/2014 07:42   Dg Abd Portable 1v  02/22/2014   CLINICAL DATA:  Initial encounter for NG tube placement  EXAM: PORTABLE ABDOMEN - 1 VIEW  COMPARISON:  None.  FINDINGS: 1320 hrs. Lung volumes are low with bilateral airspace opacities diffusely. The cardio pericardial silhouette is enlarged. Right IJ central line tip overlies 3 upper right atrium. NG tube tip is in the distal stomach.  IMPRESSION: NG tube tip is positioned in the distal stomach.   Electronically Signed   By: Kennith Center M.D.   On: 02/22/2014 13:49   BMET    Component Value Date/Time   NA 140 02/24/2014 0450   K 4.0 02/24/2014 0450   CL 102 02/24/2014 0450   CO2 20 02/24/2014 0450   GLUCOSE 137* 02/24/2014 0450   BUN 67* 02/24/2014 0450   CREATININE 5.84* 02/24/2014 0450   CALCIUM 8.4 02/24/2014 0450   GFRNONAA 8* 02/24/2014 0450   GFRAA 9*  02/24/2014 0450   CBC    Component Value Date/Time   WBC 13.5* 02/24/2014 0450   RBC 3.18* 02/24/2014 0450   HGB 8.6* 02/24/2014 0450   HCT 27.1* 02/24/2014 0450   PLT 215 02/24/2014 0450   MCV 85.2 02/24/2014 0450   MCH 27.0 02/24/2014 0450   MCHC 31.7 02/24/2014 0450   RDW 15.1 02/24/2014 0450   LYMPHSABS 1.7 02/22/2014 0035   MONOABS 0.9 02/22/2014 0035   EOSABS 0.0 02/22/2014 0035   BASOSABS 0.0 02/22/2014 0035     Assessment: 1.  Acute on CKD 2. HTN 3. Rt basal ganglia hem  Plan: 1. Plan HD again to remove more volume 2. IV iron 3. Start IV lasix to stimulate UO as she appears to be making some urine 4. Recheck labs in AM    Lynn Gentry

## 2014-02-24 NOTE — Progress Notes (Addendum)
PULMONARY / CRITICAL CARE MEDICINE   Name: Lynn Gentry MRN: [id number] DOB: 08/11/1970    ADMISSION DATE:  02/21/2014 CONSULTATION DATE:  02/24/2014  REFERRING MD :  Pearlean BrownieSethi  CHIEF COMPLAINT:  Respiratory failure in setting of ICH  INITIAL PRESENTATION: 43 [facility]. F [facility] with left sided weakness, slurred speech, inability to stand up.  In ED, found to have acute lateral right basal ganglia ICH with mild mass effect and 5mm MLS.  Admitted to neuro ICU and developed acute respiratory failure, failed bipap and PCCM called for intubation and management. She was admitted to neuro ICU and later that evening had increasing O2 demands.  She had no improvement in SpO2, work of breathing, and mental status despite 3 hours of BiPAP.  PCCM was consulted for intubation.   STUDIES:  10/06 CT head:  acute hemorrhage into the lateral right basal ganglia, likely indicating a hypertensive hemorrhage/hemorrhagic stroke.  Mild mass effect with 5mm MLS to the left. 10/07 Renal US:  echogenic kidneys s/w ckd, no hydro.  [facility]07 Doppler carotid: consistent with 1-39% stenosis involving the R internal carotid art and the L internal carotid art 10/07 TTE: LVEF 60-65%. Severe concentric LVH. LA dilatation. PASP est 43 mmHg 10[facility] LE venous Dopplers (ordered by Stroke) >>   SIGNIFICANT EVENTS: 10/6 - admitted for ICH, developed respiratory failure requiring intubation. 10/07 Persistent hypertension. Remained on nicardipine gtt. Persistent volume overload snd pulm edema pattern on CXR. Lasix gtt resulted in minimal increase in Uo 10/08 Worsening renal function, oliguria. Renal consult. HD performed 10/09 Acute change in neuro status. STAT CT [facility] Further HD planned  VITAL SIGNS: Temp:  [97.2 F (36.2 C)-99.5 F (37.5 C)] 98.1 F (36.7 C) (10/09 0400) Pulse Rate:  [86-107] 99 (10/09 1000) Resp:  [19-31] 23  (10/09 1000) BP: (112-174)/(58-76) 149/60 mmHg (10/09 1000) SpO2:  [88 %-100 %] 100 % (10/09 1000) FiO2 (%):  [50 %-60 %] 50 % (10/09 0922) Weight:  [123.23 kg (271 lb 10.8 oz)-130.3 kg (287 lb 4.2 oz)] 123.23 kg (271 lb 10.8 oz) (10/09 0400) HEMODYNAMICS: CVP:  [10 mmHg-34 mmHg] 19 mmHg VENTILATOR SETTINGS: Vent Mode:  [-] PRVC FiO2 (%):  [50 %-60 %] 50 % Set Rate:  [18 bmp] 18 bmp Vt Set:  [440 mL] 440 mL PEEP:  [10 cmH20] 10 cmH20 Plateau Pressure:  [12 cmH20-25 cmH20] 12 cmH20 INTAKE / OUTPUT: Intake/Output     10/08 0701 - 10/09 0700 10/09 0701 - 10/10 0700   I.V. (mL/kg) 933.9 (7.6) 79.1 (0.6)   NG/GT 715 40   Total Intake(mL/kg) 1648.9 (13.4) 119.1 (1)   Urine (mL/kg/hr) 165 (0.1) 50 (0.1)   Emesis/NG output     Other 2500 (0.8)    Total Output 2665 50   Net -1016.1 +69.1          PHYSICAL EXAMINATION: General: Comatose  Neuro: unresponsive, localizes with RUE > LUE HEENT: [place]/AT. PERRL Cardiovascular: RRR, no M  Lungs: no wheezes Abdomen: Obese, soft, +BS Ext: symmetric BUE > BLE edema   LABS:  I have reviewed all of today's lab results. Relevant abnormalities are discussed in the A/P section  CXR: NSC CM, pulm edema pattern   ASSESSMENT / PLAN:  PULMONARY ETT 10/6 >>> A: Acute hypoxemic respiratory failure Acute pulmonary edema  - cardiogenic P:   Cont full vent support - settings reviewed and/or adjusted Cont vent  bundle Daily SBT if/when meets criteria  CARDIOVASCULAR CVL R IJ 10/6 >>  A:  HTN crisis  Severe pulmonary edema Acute systolic v diastolic chf Elevated troponin (pk 4.24 10/07) P:  Wean nicardipine to off maintaining SBP < 180 mmHg PRN hydralazine ordered 10/09 PRN metoprolol ordered 10/09 Not a candidate for aggressive cardiac intervention presently - Cards not called  RENAL L IJ HD cath 10/08 >>  A:   Acute renal failure, oliguric Suspected CKD - likely hypertensive volume overload P:   Monitor BMET  intermittently Monitor I/Os Correct electrolytes as indicated Further HD planned 10/09  GASTROINTESTINAL A:   Obesity P:   SUP: Pantoprazole Cont TFs  HEMATOLOGIC A:   ICU acquired anemia P:  DVT px: SCDs Monitor CBC intermittently Transfuse per usual ICU guidelines  INFECTIOUS A:   No overt infection P:   Monitor off abx   ENDOCRINE A:   Hyperglycemia, controlled P:   CBG's q4hr SSI  NEUROLOGIC A:   Hypertensive R basal ganglion hemorrhage  Acute encephalopathy P:   Mgmt per Stroke team Sedation: fentanyl, PRN midaz RASS goal: -1 to -2 Daily WUA.  FAMILY Family updates: mother and husband updated at bedside IDT family conference:    TODAY'S SUMMARY:   35 mins CCM time  *Care during the described time interval was provided by me and/or other providers on the critical care team. I have reviewed this patient's available data, including medical history, events of note, physical examination and test results as part of my evaluation.   Billy Fischer, MD;  PCCM service; Mobile [phone]

## 2014-02-24 NOTE — Progress Notes (Addendum)
During bedside shift change assessment, pt noted to have a neurological change. Pt no longer moving R side, withdrawing L side to pain. Pt's pupils unchanged, R 70mm round sluggish, L 48mm round sluggish. Fentanyl turned off. Pt did not return to baseline with stopping of fentanyl so Eliezer Champagne, PA was notified of changes and a stat CT head was ordered and completed.  Dr. Pearlean Brownie notified of CT completion, reviewed scan, and came to assess the patient and update the pt's husband and mother at the bedside. Pt began to localize with the L arm and had non-purposeful movement in the R arm to painful stimulation.

## 2014-02-24 NOTE — Progress Notes (Signed)
UR completed.  Awaiting medical stability to be able to make definite d/c plans.  Carlyle Lipa, RN BSN MHA CCM Trauma/Neuro ICU Case Manager [phone]

## 2014-02-24 NOTE — Progress Notes (Signed)
eLink Physician-Brief Progress Note Patient Name: Merrilie Ridpath DOB: 1971/01/31 MRN: [id number]   Date of Service  02/24/2014  HPI/Events of Note    eICU Interventions  Order clarification: cardene gtt to be titrated to SBP < 140 due per neurology recs     Intervention Category Intermediate Interventions: Hypertension - evaluation and management  MCQUAID, DOUGLAS 02/24/2014, 11:28 PM

## 2014-02-25 ENCOUNTER — Inpatient Hospital Stay (HOSPITAL_COMMUNITY): Payer: 59

## 2014-02-25 DIAGNOSIS — J9601 Acute respiratory failure with hypoxia: Secondary | ICD-10-CM

## 2014-02-25 DIAGNOSIS — N179 Acute kidney failure, unspecified: Secondary | ICD-10-CM

## 2014-02-25 LAB — RENAL FUNCTION PANEL
Albumin: 2.8 g/dL — ABNORMAL LOW (ref 3.5–5.2)
Anion gap: 19 — ABNORMAL HIGH (ref 5–15)
BUN: 69 mg/dL — ABNORMAL HIGH (ref 6–23)
CALCIUM: 8.7 mg/dL (ref 8.4–10.5)
CO2: 21 mEq/L (ref 19–32)
CREATININE: 5.72 mg/dL — AB (ref 0.50–1.10)
Chloride: 99 mEq/L (ref 96–112)
GFR calc Af Amer: 10 mL/min — ABNORMAL LOW (ref >90)
GFR calc non Af Amer: 8 mL/min — ABNORMAL LOW (ref >90)
GLUCOSE: 135 mg/dL — AB (ref 70–99)
Phosphorus: 6.3 mg/dL — ABNORMAL HIGH (ref 2.3–4.6)
Potassium: 3.7 mEq/L (ref 3.7–5.3)
Sodium: 139 mEq/L (ref 137–147)

## 2014-02-25 LAB — CBC
HEMATOCRIT: 26.7 % — AB (ref 36.0–46.0)
HEMOGLOBIN: 8.4 g/dL — AB (ref 12.0–15.0)
MCH: 26.7 pg (ref 26.0–34.0)
MCHC: 31.5 g/dL (ref 30.0–36.0)
MCV: 84.8 fL (ref 78.0–100.0)
Platelets: 218 10*3/uL (ref 150–400)
RBC: 3.15 MIL/uL — ABNORMAL LOW (ref 3.87–5.11)
RDW: 15.2 % (ref 11.5–15.5)
WBC: 13.8 10*3/uL — ABNORMAL HIGH (ref 4.0–10.5)

## 2014-02-25 LAB — GLUCOSE, CAPILLARY
GLUCOSE-CAPILLARY: 125 mg/dL — AB (ref 70–99)
GLUCOSE-CAPILLARY: 144 mg/dL — AB (ref 70–99)
GLUCOSE-CAPILLARY: 156 mg/dL — AB (ref 70–99)
Glucose-Capillary: 100 mg/dL — ABNORMAL HIGH (ref 70–99)
Glucose-Capillary: 123 mg/dL — ABNORMAL HIGH (ref 70–99)
Glucose-Capillary: 155 mg/dL — ABNORMAL HIGH (ref 70–99)

## 2014-02-25 MED ORDER — INFLUENZA VAC SPLIT QUAD 0.5 ML IM SUSY
0.5000 mL | PREFILLED_SYRINGE | INTRAMUSCULAR | Status: AC
  Administered 2014-02-26: 0.5 mL via INTRAMUSCULAR
  Filled 2014-02-25: qty 0.5

## 2014-02-25 MED ORDER — METOPROLOL TARTRATE 25 MG/10 ML ORAL SUSPENSION
12.5000 mg | Freq: Two times a day (BID) | ORAL | Status: DC
  Administered 2014-02-25 (×2): 12.5 mg
  Filled 2014-02-25 (×4): qty 5

## 2014-02-25 MED ORDER — PNEUMOCOCCAL VAC POLYVALENT 25 MCG/0.5ML IJ INJ
0.5000 mL | INJECTION | INTRAMUSCULAR | Status: AC
  Administered 2014-02-26: 0.5 mL via INTRAMUSCULAR
  Filled 2014-02-25: qty 0.5

## 2014-02-25 MED ORDER — AMLODIPINE 1 MG/ML ORAL SUSPENSION: 5.0000 mg | Freq: Every day | ORAL | Status: DC

## 2014-02-25 MED ORDER — FUROSEMIDE 10 MG/ML IJ SOLN
160.0000 mg | Freq: Three times a day (TID) | INTRAVENOUS | Status: DC
  Administered 2014-02-25 – 2014-02-27 (×6): 160 mg via INTRAVENOUS
  Filled 2014-02-25 (×9): qty 16

## 2014-02-25 MED ORDER — AMLODIPINE BESYLATE 5 MG PO TABS
5.0000 mg | ORAL_TABLET | Freq: Every day | ORAL | Status: DC
  Administered 2014-02-25: 5 mg via ORAL
  Filled 2014-02-25 (×2): qty 1

## 2014-02-25 NOTE — Progress Notes (Signed)
PULMONARY / CRITICAL CARE MEDICINE   Name: Lynn Gentry MRN: [id number] DOB: 02/05/1971    ADMISSION DATE:  02/21/2014 CONSULTATION DATE:  02/25/2014  REFERRING MD :  Pearlean Brownie  CHIEF COMPLAINT:  Respiratory failure in setting of ICH  INITIAL PRESENTATION: 43 y.o. F brought to [facility] ED on 10/6 with left sided weakness, slurred speech, inability to stand up.  In ED, found to have acute lateral right basal ganglia ICH with mild mass effect and 73mm MLS.  Admitted to neuro ICU and developed acute respiratory failure, failed bipap and PCCM called for intubation and management. She was admitted to neuro ICU and later that evening had increasing O2 demands.  She had no improvement in SpO2, work of breathing, and mental status despite 3 hours of BiPAP.  PCCM was consulted for intubation.  STUDIES:  10/06 CT head:  acute hemorrhage into the lateral right basal ganglia, likely indicating a hypertensive hemorrhage/hemorrhagic stroke.  Mild mass effect with 65mm MLS to the left. 10/07 Renal US:  echogenic kidneys s/w ckd, no hydro.  10/07 CT head: [facility] 10/07 repeat CT head: [facility] 10/07 Doppler carotid: consistent with 1-39% stenosis involving the R internal carotid art and the L internal carotid art 10/07 TTE: LVEF 60-65%. Severe concentric LVH. LA dilatation. PASP est 43 mmHg 10/09 CT head: [facility] 10/09 LE venous Dopplers (ordered by Stroke) >>   SIGNIFICANT EVENTS: 10/6 - admitted for ICH, developed respiratory failure requiring intubation. 10/07 Persistent hypertension. Remained on nicardipine gtt. Persistent volume overload snd pulm edema pattern on CXR. Lasix gtt resulted in minimal increase in Uo 10/08 Worsening renal function, oliguria. Renal consult. HD performed 10/09 Acute change in neuro status. STAT CT head ordered: [facility] 10/09 Further HD planned  VITAL SIGNS: Temp:  [99.1 F (37.3 C)-100 F (37.8 C)] 99.3 F (37.4 C) (10/10 0755) Pulse Rate:  [93-110] 93 (10/10 0800) Resp:  [20-31] 20  (10/10 0800) BP: (120-183)/(39-74) 120/39 mmHg (10/10 0800) SpO2:  [96 %-100 %] 100 % (10/10 0800) FiO2 (%):  [40 %-50 %] 40 % (10/10 0753) Weight:  [123.6 kg (272 lb 7.8 oz)-127.6 kg (281 lb 4.9 oz)] 124.7 kg (274 lb 14.6 oz) (10/10 0415)  HEMODYNAMICS: CVP:  [11 mmHg-26 mmHg] 18 mmHg  VENTILATOR SETTINGS: Vent Mode:  [-] PRVC FiO2 (%):  [40 %-50 %] 40 % Set Rate:  [18 bmp] 18 bmp Vt Set:  [440 mL] 440 mL PEEP:  [8 cmH20] 8 cmH20 Plateau Pressure:  [12 cmH20-25 cmH20] 23 cmH20  INTAKE / OUTPUT: Intake/Output     10/09 0701 - 10/10 0700 10/10 0701 - 10/11 0700   I.V. (mL/kg) 1219.3 (9.8) 146 (1.2)   NG/GT 1113.3 40   IV Piggyback 100    Total Intake(mL/kg) 2432.6 (19.5) 186 (1.5)   Urine (mL/kg/hr) 415 (0.1)    Other 4038 (1.3)    Total Output 4453     Net -2020.4 +186         PHYSICAL EXAMINATION: General: Comatose  Neuro: Unresponsive, localizes with RUE > LUE HEENT: [place]/AT. PERRL Cardiovascular: RRR, no M  Lungs: No wheezes Abdomen: Obese, soft, +BS Ext: symmetric BUE > BLE edema   LABS:  I have reviewed all of today's lab results. Relevant abnormalities are discussed in the A/P section  CXR: NSC CM, pulm edema pattern  ASSESSMENT / PLAN:  PULMONARY ETT 10/6 >>> A: Acute hypoxemic respiratory failure Acute pulmonary edema  - cardiogenic P:   Decrease PEEP to 5 Begin PS trials Cont vent bundle Daily  SBT if/when meets criteria No extubation due to mental status  CARDIOVASCULAR CVL R IJ 10/6 >>  A:  HTN crisis  Severe pulmonary edema Acute systolic v diastolic chf Elevated troponin (pk 4.24 10/07) P:  Wean nicardipine to off maintaining SBP < 180 mmHg Start lopressor 12.5 mg PO BID with holding parameters Start norvasc 5 mg PO daily with holding parameters PRN hydralazine ordered 10/09 PRN metoprolol ordered 10/09 Not a candidate for aggressive cardiac intervention presently - Cards not called yet  RENAL L IJ HD cath 10/08 >>  A:   Acute  renal failure, oliguric Suspected CKD - likely hypertensive volume overload P:   Monitor BMET intermittently Monitor I/Os Correct electrolytes as indicated HD per renal  GASTROINTESTINAL A:   Obesity P:   SUP: Pantoprazole Cont TFs  HEMATOLOGIC A:   ICU acquired anemia P:  DVT px: SCDs Monitor CBC intermittently Transfuse per usual ICU guidelines  INFECTIOUS A:   No overt infection P:   Monitor off abx   ENDOCRINE A:   Hyperglycemia, controlled P:   CBG's q4hr SSI  NEUROLOGIC A:   Hypertensive R basal ganglion hemorrhage  Acute encephalopathy P:   Mgmt per Stroke team Sedation: fentanyl drip, PRN midaz RASS goal: -1 to -2 Daily WUA.  FAMILY Family updates: mother and husband updated at bedside IDT family conference:   TODAY'S SUMMARY: will drop PEEP to 5, continue diureses, begin PS trials, hold off extubation for now, suspect will need trach/peg before this is over.  35 mins CCM time  *Care during the described time interval was provided by me and/or other providers on the critical care team. I have reviewed this patient's available data, including medical history, events of note, physical examination and test results as part of my evaluation.  Alyson ReedyWesam G. Yacoub, M.D. [facility] Pulmonary/Critical Care Medicine. Pager: [phone]. After hours pager: [phone].

## 2014-02-25 NOTE — Progress Notes (Signed)
Bilateral lower extremity venous duplex completed:  No obvious evidence of DVT, superficial thrombosis, or Baker's cyst.  Technically difficult study due to the patient's body habitus.   

## 2014-02-25 NOTE — Progress Notes (Signed)
STROKE TEAM PROGRESS NOTE   HISTORY Lynn Gentry is an 43 y.o. female with no previously documented medical disorder who was brought to [facility] with new onset slurred speech and left-sided weakness. Patient went to work as usual until 1500. She was found by her son at home at 1600 unable to get up out of a chair with left sided weakness, slurred speech. Patient was last known well around 3 PM this afternoon 02/21/2014. CT scan of her head showed an acute 4.1 x 1.5 cm right basal ganglia hemorrhage with mild mass effect and right to left shift of 5 mm. Patient's blood pressure was markedly elevated at 226/111. She was started on Cardene drip and blood pressure responded well. She has shown continual respiratory difficulty. She was initially given oxygen by nasal cannula followed by NBR, and subsequently placed on BiPAP. She continued to have oxygen saturations only in the high 80s to low 90s on 100% O2, with a respiratory rates in the 40s. ABG showed pH of 2.82, PCO2 37 PO2 of 57. Chest x-ray showed mild cardiomegaly with possible asymmetric pulmonary edema. BNP was 36,455. Because of deteriorating respiratory status intubation and mechanical ventilation was elected. CCM service was consulted. Patient was not administered TPA secondary to ICH. She was admitted to the neuro ICU  for further evaluation and treatment.   SUBJECTIVE (INTERVAL HISTORY) On tube feeds,   Neurologically stable but yet has pulm edema, ventilated and no extubation due to poor MS, CKD and poor urine output but some increase in urine output with IV lasix, renal and respiratory failure on HD again today to try and remove more volume. Had neurological worsening with new rt sided weakness but stat head Ct on 10/7 showed stable resolving hematoma without any increase mass effect or midline shift   OBJECTIVE Temp:  [99.1 F (37.3 C)-99.8 F (37.7 C)] 99.8 F (37.7 C) (10/10 1200) Pulse Rate:  [81-108] 92 (10/10  1414) Cardiac Rhythm:  [-] Sinus tachycardia (10/10 0800) Resp:  [20-31] 22 (10/10 1409) BP: (120-183)/(39-80) 143/69 mmHg (10/10 1414) SpO2:  [96 %-100 %] 100 % (10/10 1414) FiO2 (%):  [40 %-50 %] 40 % (10/10 1200) Weight:  [272 lb 7.8 oz (123.6 kg)-274 lb 14.6 oz (124.7 kg)] 274 lb 14.6 oz (124.7 kg) (10/10 0415)   Recent Labs Lab 02/24/14 2007 02/25/14 0005 02/25/14 0416 02/25/14 0743 02/25/14 1209  GLUCAP 141* 144* 123* 125* 156*    Recent Labs Lab 02/21/14 1900 02/22/14 0035 02/23/14 0420 02/23/14 0500 02/24/14 0450 02/25/14 0433  NA 137 140 137  --  140 139  K 3.6* 4.0 4.0  --  4.0 3.7  CL 101 103 104  --  102 99  CO2 17* 20 16*  --  20 21  GLUCOSE 131* 160* 116*  --  137* 135*  BUN 42* 43* 60*  --  67* 69*  CREATININE 3.58* 4.14* 5.70*  --  5.84* 5.72*  CALCIUM 9.3 8.5 8.6  --  8.4 8.7  MG  --  1.9  --  1.9  --   --   PHOS  --  6.5*  --  5.9*  --  6.3*    Recent Labs Lab 02/21/14 1900 02/24/14 0450 02/25/14 0433  AST 25 20  --   ALT 30 16  --   ALKPHOS 152* 112  --   BILITOT 0.9 0.6  --   PROT 7.9 6.7  --   ALBUMIN 3.7 2.8* 2.8*  Recent Labs Lab 02/21/14 1900 02/22/14 0035 02/23/14 0420 02/24/14 0450 02/25/14 0433  WBC 14.6* 21.7* 10.8* 13.5* 13.8*  NEUTROABS 12.4* 19.1*  --   --   --   HGB 11.4* 10.4* 8.9* 8.6* 8.4*  HCT 35.2* 32.8* 27.7* 27.1* 26.7*  MCV 85.0 88.6 86.3 85.2 84.8  PLT 329 334 225 215 218    Recent Labs Lab 02/21/14 1900 02/22/14 0640 02/22/14 1506  TROPONINI <0.30 2.66* 4.24*   No results found for this basename: LABPROT, INR,  in the last 72 hours No results found for this basename: COLORURINE, APPERANCEUR, LABSPEC, PHURINE, GLUCOSEU, HGBUR, BILIRUBINUR, KETONESUR, PROTEINUR, UROBILINOGEN, NITRITE, LEUKOCYTESUR,  in the last 72 hours  No results found for this basename: chol,  trig,  hdl,  cholhdl,  vldl,  ldlcalc   Lab Results  Component Value Date   HGBA1C 5.8* 02/23/2014   No results found for this  basename: labopia,  cocainscrnur,  labbenz,  amphetmu,  thcu,  labbarb    No results found for this basename: ETH,  in the last 168 hours  Ct Head Wo Contrast 02/22/2014   1. No significant interval change in the patient's intraparenchymal hemorrhage at the right lateral basal ganglia, measuring 4.1 x 1.2 cm, with mild surrounding vasogenic edema. Mild associated mass effect, with 4 mm of leftward midline shift again seen. 2. Scattered small vessel ischemic microangiopathy. 3. Bilateral proptosis noted.    02/21/2014   1. Acute hemorrhage into the lateral right basal ganglia, likely indicating a hypertensive hemorrhage/hemorrhagic stroke. 2. Mild mass effect with shift of the midline to the left approximating 5 mm. Critical   US Renal Port 02/22/2014    1. Small echogenic kidneys consistent with chronic renal medical disease. No hydronephrosis. 2. The urinary bladder is decompressed by Foley catheter and cannot be evaluated.     Dg Chest Port 1 View 02/22/2014    1. Satisfactory positioning of endotracheal tube and right IJ line. 2. Cardiomegaly with persistent diffuse interstitial and airspace disease likely reflecting edema and congestive heart failure. 3. Bilateral pleural effusions and basilar airspace disease. This likely reflects atelectasis.    02/21/2014    Mild cardiac enlargement and possible asymmetric pulmonary edema. However, exam is limited by body habitus.      PHYSICAL EXAM Young obese Caucasian lady not in distress..Intubated and sedated. Afebrile. Head is nontraumatic. Neck is supple without bruit.    l. Cardiac exam soft ejection murmur RRR .no gallop. Lungs bilateral crackles and  rhonchi to auscultation. Distal pulses are well felt. Gen: intubated and sedated   Neurological Exam : intubated sedated on Fentanyl. Slight downgaze preference. Eyes conjugate. Pupils 3 mm reactive. Fundi not visualized. Left lower face weakness. Moves rt upper extremity  less than left. Moving left  extremities and right leg spontaneously.  Does not move extremities to painful stimuli. Left Plantar upgoing and right down going.  ASSESSMENT/PLAN Lynn Gentry is a 43 y.o. female with no documented medical history found with new onset slurred speech and left-sided weakness.   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 - Right vertebral artery not imaged due to central line dressing. Left vertebral artery not imaged due to patient position  2D Echo  Left ventricle: The cavity size was normal. There was severe concentric hypertrophy. Systolic function was normal. The estimated ejection fraction was in the range of 60% to 65%. Wall motion was normal; there were no regional wall motion abnormalities.   HgbA1c 5.8  SCDs for VTE prophylaxis  Suspect will likely need trach and PEG  Bedrest  Resultant VDRF, left hemiparesis  no antithrombotics prior to admission  Ongoing aggressive risk factor management  Therapy recommendations:  pending  Disposition:  pending  Acute Respiratory Failure with severe acute pulmonary edema  Intubated  Sedated  CCM following  Diuresing, IV Lasix, Hemodialysis, urine output slightly increased today  Leukocytosis  White blood cells improving, today 13.8 (3 days ago 21.7)  Monitoring off antibiotics  Malignant Hypertension  BP 226/111 at onset  Home meds:   none  SBP goal < 140 due to presence of end organ damage  On cardene drip  BP 106-226/54-111 past 24h (02/25/2014 @ 2:25 PM)  Unstable   Acute renal failure with volume overload, suspect baseline chronic kidney  disease  Diuresis and management per critical care, on HD  Other Stroke Risk Factors ETOH use   Morbid Obesity, Body mass index is 47.17 kg/(m^2).   Cancel MRI scan of brain as patient cannot lay flat due to pulmonary edema.  Hospital day # 4 02/25/2014 2:25 PM  This patient is critically ill and at significant risk of neurological worsening, death and care requires constant monitoring of vital signs, hemodynamics,respiratory and cardiac monitoring,review of multiple databases, neurological assessment, discussion with family, other specialists and medical decision making of high complexity.I have made any additions or clarifications directly to the above note I spent 30 minutes of neurocritical care time  in the care of  this patient.  I have personally examined this patient, reviewed notes, independently viewed imaging studies, participated in medical decision making and plan of care. I have made any additions or clarifications directly to the above note. Agree with note above.   Artemio Alyoni Iniko Robles, MD  [facility]  Pager: [phone] 02/24/2014 5:16 PM     To contact Stroke Continuity provider, please refer to [email or web address]. After hours, contact General Neurology

## 2014-02-25 NOTE — Progress Notes (Signed)
S: intubated O:BP 143/55  Pulse 103  Temp(Src) 99.1 F (37.3 C) (Oral)  Resp 24  Ht 5\' 4"  (1.626 m)  Wt 124.7 kg (274 lb 14.6 oz)  BMI 47.17 kg/m2  SpO2 96%  Intake/Output Summary (Last 24 hours) at 02/25/14 0733 Last data filed at 02/25/14 0600  Gross per 24 hour  Intake 2312.59 ml  Output   4453 ml  Net -2140.41 ml   Weight change: -2.7 kg (-5 lb 15.2 oz) Gen: intubated and sedated CVS:RRR Resp: bil crackles.  Scattered rhonchi Abd:+ BS ND Ext: tr edema NEURO: Sedated.  Moving both feet Rt IJ triple lumen   . antiseptic oral rinse  7 mL Mouth Rinse QID  . chlorhexidine  15 mL Mouth Rinse BID  . Chlorhexidine Gluconate Cloth  6 each Topical Q0600  . feeding supplement (PRO-STAT SUGAR FREE 64)  60 mL Per Tube BID  . feeding supplement (VITAL HIGH PROTEIN)  1,000 mL Per Tube Q24H  . furosemide  160 mg Intravenous BID  . insulin aspart  0-15 Units Subcutaneous 6 times per day  . multivitamin  5 mL Per Tube Daily  . mupirocin ointment  1 application Nasal BID  . pantoprazole sodium  40 mg Per Tube Q1200  . senna-docusate  1 tablet Per Tube BID   Ct Head Wo Contrast  02/24/2014   CLINICAL DATA:  Subsequent encounter for intracranial hemorrhage.  EXAM: CT HEAD WITHOUT CONTRAST  TECHNIQUE: Contiguous axial images were obtained from the base of the skull through the vertex without intravenous contrast.  COMPARISON:  02/22/2014  FINDINGS: The right hemispheric intra axial hematoma persists within the basal ganglia. This measures 4.1 x 1.1 cm today compared to 4.2 x 1.2 cm previously. A small rim of surrounding edema is stable. No substantial mass effect.  No evidence for midline shift. No evidence for involving hydrocephalus. No abnormal extra-axial fluid collection.  Visualized portions of the paranasal sinuses are clear. Mastoid air cells are clear.  IMPRESSION: Stable appearance of the hematoma within the right basal ganglia. No new or progressive findings.   Electronically Signed    By: Kennith CenterEric  Mansell M.D.   On: 02/24/2014 09:23   Dg Chest Port 1 View  02/24/2014   CLINICAL DATA:  Pulmonary edema.  EXAM: PORTABLE CHEST - 1 VIEW  COMPARISON:  02/23/2014 and 02/21/2014  FINDINGS: Endotracheal tube, 2 central venous catheters, and NG tube appear in good position. There is recurrent pulmonary vascular congestion with perihilar haziness consistent with pulmonary edema.  IMPRESSION: Recurrent pulmonary vascular congestion and pulmonary edema.   Electronically Signed   By: Geanie CooleyJim  Maxwell M.D.   On: 02/24/2014 07:55   Dg Chest Port 1 View  02/23/2014   CLINICAL DATA:  Central catheter placement  EXAM: PORTABLE CHEST - 1 VIEW  COMPARISON:  February 23, 2014  FINDINGS: The new central catheter has been placed from a left-sided approach with its tip is at the cavoatrial junction. Endotracheal tube tip is 4.8 cm above the carina. Right jugular catheter tip is at the cavoatrial junction. Nasogastric tube tip and side port are below the diaphragm. No pneumothorax. There is persistent cardiomegaly. There is consolidation in the left base. The right lung is clear. Pulmonary vascularity is normal. No adenopathy.  IMPRESSION: Tube and catheter positions as described without pneumothorax. Stable cardiomegaly. Left lower lobe consolidation persists. Right lung clear.   Electronically Signed   By: Bretta BangWilliam  Woodruff M.D.   On: 02/23/2014 12:36   BMET  Component Value Date/Time   NA 139 02/25/2014 0433   K 3.7 02/25/2014 0433   CL 99 02/25/2014 0433   CO2 21 02/25/2014 0433   GLUCOSE 135* 02/25/2014 0433   BUN 69* 02/25/2014 0433   CREATININE 5.72* 02/25/2014 0433   CALCIUM 8.7 02/25/2014 0433   GFRNONAA 8* 02/25/2014 0433   GFRAA 10* 02/25/2014 0433   CBC    Component Value Date/Time   WBC 13.8* 02/25/2014 0433   RBC 3.15* 02/25/2014 0433   HGB 8.4* 02/25/2014 0433   HCT 26.7* 02/25/2014 0433   PLT 218 02/25/2014 0433   MCV 84.8 02/25/2014 0433   MCH 26.7 02/25/2014 0433   MCHC 31.5  02/25/2014 0433   RDW 15.2 02/25/2014 0433   LYMPHSABS 1.7 02/22/2014 0035   MONOABS 0.9 02/22/2014 0035   EOSABS 0.0 02/22/2014 0035   BASOSABS 0.0 02/22/2014 0035     Assessment: 1.  Acute on CKD, some increse in UO 2. HTN 3. Rt basal ganglia hem  Plan: 1. Plan HD again to remove more volume 2. IV lasix   Rogue Pautler T

## 2014-02-26 ENCOUNTER — Inpatient Hospital Stay (HOSPITAL_COMMUNITY): Payer: 59

## 2014-02-26 LAB — RENAL FUNCTION PANEL
ALBUMIN: 2.4 g/dL — AB (ref 3.5–5.2)
Anion gap: 17 — ABNORMAL HIGH (ref 5–15)
BUN: 59 mg/dL — ABNORMAL HIGH (ref 6–23)
CO2: 20 mEq/L (ref 19–32)
CREATININE: 4.18 mg/dL — AB (ref 0.50–1.10)
Calcium: 7.9 mg/dL — ABNORMAL LOW (ref 8.4–10.5)
Chloride: 102 mEq/L (ref 96–112)
GFR calc Af Amer: 14 mL/min — ABNORMAL LOW (ref >90)
GFR calc non Af Amer: 12 mL/min — ABNORMAL LOW (ref >90)
GLUCOSE: 129 mg/dL — AB (ref 70–99)
PHOSPHORUS: 5 mg/dL — AB (ref 2.3–4.6)
Potassium: 3.7 mEq/L (ref 3.7–5.3)
Sodium: 139 mEq/L (ref 137–147)

## 2014-02-26 LAB — BLOOD GAS, ARTERIAL
ACID-BASE DEFICIT: 2.2 mmol/L — AB (ref 0.0–2.0)
Bicarbonate: 22.1 mEq/L (ref 20.0–24.0)
DRAWN BY: 33176
FIO2: 0.4 %
LHR: 18 {breaths}/min
MECHVT: 440 mL
O2 SAT: 98.5 %
PATIENT TEMPERATURE: 98.2
PCO2 ART: 37 mmHg (ref 35.0–45.0)
PEEP: 5 cmH2O
TCO2: 23.2 mmol/L (ref 0–100)
pH, Arterial: 7.392 (ref 7.350–7.450)
pO2, Arterial: 128 mmHg — ABNORMAL HIGH (ref 80.0–100.0)

## 2014-02-26 LAB — CBC
HEMATOCRIT: 25.1 % — AB (ref 36.0–46.0)
Hemoglobin: 8.2 g/dL — ABNORMAL LOW (ref 12.0–15.0)
MCH: 27.8 pg (ref 26.0–34.0)
MCHC: 32.7 g/dL (ref 30.0–36.0)
MCV: 85.1 fL (ref 78.0–100.0)
Platelets: 235 10*3/uL (ref 150–400)
RBC: 2.95 MIL/uL — ABNORMAL LOW (ref 3.87–5.11)
RDW: 15.1 % (ref 11.5–15.5)
WBC: 14.6 10*3/uL — AB (ref 4.0–10.5)

## 2014-02-26 LAB — GLUCOSE, CAPILLARY
GLUCOSE-CAPILLARY: 125 mg/dL — AB (ref 70–99)
GLUCOSE-CAPILLARY: 150 mg/dL — AB (ref 70–99)
GLUCOSE-CAPILLARY: 139 mg/dL — AB (ref 70–99)
Glucose-Capillary: 125 mg/dL — ABNORMAL HIGH (ref 70–99)
Glucose-Capillary: 145 mg/dL — ABNORMAL HIGH (ref 70–99)
Glucose-Capillary: 153 mg/dL — ABNORMAL HIGH (ref 70–99)
Glucose-Capillary: 157 mg/dL — ABNORMAL HIGH (ref 70–99)

## 2014-02-26 LAB — MAGNESIUM: Magnesium: 2 mg/dL (ref 1.5–2.5)

## 2014-02-26 MED ORDER — DARBEPOETIN ALFA-POLYSORBATE 100 MCG/0.5ML IJ SOLN
100.0000 ug | INTRAMUSCULAR | Status: DC
  Administered 2014-03-06 – 2014-03-13 (×2): 100 ug via INTRAVENOUS
  Filled 2014-02-26 (×4): qty 0.5

## 2014-02-26 MED ORDER — IPRATROPIUM-ALBUTEROL 0.5-2.5 (3) MG/3ML IN SOLN: 3.0000 mL | Freq: Four times a day (QID) | RESPIRATORY_TRACT | Status: DC | PRN

## 2014-02-26 MED ORDER — AMLODIPINE BESYLATE 10 MG PO TABS
10.0000 mg | ORAL_TABLET | Freq: Every day | ORAL | Status: DC
  Administered 2014-02-26 – 2014-03-04 (×7): 10 mg via ORAL
  Filled 2014-02-26 (×8): qty 1

## 2014-02-26 MED ORDER — METOPROLOL TARTRATE 25 MG/10 ML ORAL SUSPENSION
25.0000 mg | Freq: Two times a day (BID) | ORAL | Status: DC
  Administered 2014-02-26 – 2014-02-28 (×6): 25 mg
  Filled 2014-02-26 (×6): qty 10

## 2014-02-26 MED ORDER — IPRATROPIUM-ALBUTEROL 0.5-2.5 (3) MG/3ML IN SOLN
RESPIRATORY_TRACT | Status: AC
  Administered 2014-02-26: 3 mL
  Filled 2014-02-26: qty 3

## 2014-02-26 NOTE — Progress Notes (Signed)
S: intubated O:BP 149/66  Pulse 97  Temp(Src) 99.9 F (37.7 C) (Oral)  Resp 25  Ht 5\' 4"  (1.626 m)  Wt 123.8 kg (272 lb 14.9 oz)  BMI 46.83 kg/m2  SpO2 100%  Intake/Output Summary (Last 24 hours) at 02/26/14 0746 Last data filed at 02/26/14 0700  Gross per 24 hour  Intake   3139 ml  Output   4235 ml  Net  -1096 ml   Weight change: -2.9 kg (-6 lb 6.3 oz) Gen: intubated and sedated CVS:RRR Resp: bil crackles.  Abd:+ BS ND Ext: tr edema NEURO: Sedated.  Moving both feet Rt IJ triple lumen Lt IJ HD cath   . amLODipine  5 mg Oral Daily  . antiseptic oral rinse  7 mL Mouth Rinse QID  . chlorhexidine  15 mL Mouth Rinse BID  . feeding supplement (PRO-STAT SUGAR FREE 64)  60 mL Per Tube BID  . feeding supplement (VITAL HIGH PROTEIN)  1,000 mL Per Tube Q24H  . furosemide  160 mg Intravenous 3 times per day  . Influenza vac split quadrivalent PF  0.5 mL Intramuscular Tomorrow-1000  . insulin aspart  0-15 Units Subcutaneous 6 times per day  . metoprolol tartrate  12.5 mg Per Tube BID  . multivitamin  5 mL Per Tube Daily  . mupirocin ointment  1 application Nasal BID  . pantoprazole sodium  40 mg Per Tube Q1200  . pneumococcal 23 valent vaccine  0.5 mL Intramuscular Tomorrow-1000  . senna-docusate  1 tablet Per Tube BID   Ct Head Wo Contrast  02/24/2014   CLINICAL DATA:  Subsequent encounter for intracranial hemorrhage.  EXAM: CT HEAD WITHOUT CONTRAST  TECHNIQUE: Contiguous axial images were obtained from the base of the skull through the vertex without intravenous contrast.  COMPARISON:  02/22/2014  FINDINGS: The right hemispheric intra axial hematoma persists within the basal ganglia. This measures 4.1 x 1.1 cm today compared to 4.2 x 1.2 cm previously. A small rim of surrounding edema is stable. No substantial mass effect.  No evidence for midline shift. No evidence for involving hydrocephalus. No abnormal extra-axial fluid collection.  Visualized portions of the paranasal sinuses  are clear. Mastoid air cells are clear.  IMPRESSION: Stable appearance of the hematoma within the right basal ganglia. No new or progressive findings.   Electronically Signed   By: Kennith CenterEric  Mansell M.D.   On: 02/24/2014 09:23   Dg Chest Port 1 View  02/25/2014   CLINICAL DATA:  Respiratory failure.  Subsequent encounter  EXAM: PORTABLE CHEST - 1 VIEW  COMPARISON:  02/24/2014  FINDINGS: The endotracheal tube terminates 1.5 cm from the carina and could be pulled back 1-2 cm for more optimal positioning. Bilateral internal jugular venous catheters are stable in position. The heart is enlarged. Mild edema is stable. Bibasilar airspace disease is unchanged.  IMPRESSION: 1. The endotracheal tube terminates 1.5 cm from the carina and could be pulled back for more optimal positioning. 2. Stable cardiomegaly and moderate interstitial edema. 3. Stable bibasilar airspace disease, likely atelectasis. These results were called by telephone at the time of interpretation on 02/25/2014 at 7:55 am to [facility]on, [place]RN, who verbally acknowledged these results.   Electronically Signed   By: Gennette Pachris  Mattern M.D.   On: 02/25/2014 07:56   BMET    Component Value Date/Time   NA 139 02/26/2014 0400   K 3.7 02/26/2014 0400   CL 102 02/26/2014 0400   CO2 20 02/26/2014 0400   GLUCOSE 129* 02/26/2014  0400   BUN 59* 02/26/2014 0400   CREATININE 4.18* 02/26/2014 0400   CALCIUM 7.9* 02/26/2014 0400   GFRNONAA 12* 02/26/2014 0400   GFRAA 14* 02/26/2014 0400   CBC    Component Value Date/Time   WBC 14.6* 02/26/2014 0400   RBC 2.95* 02/26/2014 0400   HGB 8.2* 02/26/2014 0400   HCT 25.1* 02/26/2014 0400   PLT 235 02/26/2014 0400   MCV 85.1 02/26/2014 0400   MCH 27.8 02/26/2014 0400   MCHC 32.7 02/26/2014 0400   RDW 15.1 02/26/2014 0400   LYMPHSABS 1.7 02/22/2014 0035   MONOABS 0.9 02/22/2014 0035   EOSABS 0.0 02/22/2014 0035   BASOSABS 0.0 02/22/2014 0035     Assessment: 1.  Acute on CKD, UO minimal 2. HTN 3. Rt basal  ganglia hem 4. Anemia, Given IV feraheme.    Plan: 1. Plan HD tomorrow  2. Cont IV lasix for another day or so.  If UO does not increase then will DC 3. Start aranesp   Lynn Gentry

## 2014-02-26 NOTE — Progress Notes (Signed)
STROKE TEAM PROGRESS NOTE   HISTORY Lynn Gentry is an 43 y.o. female with no previously documented medical disorder who was brought to [facility] with new onset slurred speech and left-sided weakness. Patient went to work as usual until 1500. She was found by her son at home at 1600 unable to get up out of a chair with left sided weakness, slurred speech. Patient was last known well around 3 PM this afternoon 02/21/2014. CT scan of her head showed an acute 4.1 x 1.5 cm right basal ganglia hemorrhage with mild mass effect and right to left shift of 5 mm. Patient's blood pressure was markedly elevated at 226/111. She was started on Cardene drip and blood pressure responded well. She has shown continual respiratory difficulty. She was initially given oxygen by nasal cannula followed by NBR, and subsequently placed on BiPAP. She continued to have oxygen saturations only in the high 80s to low 90s on 100% O2, with a respiratory rates in the 40s. ABG showed pH of 2.82, PCO2 37 PO2 of 57. Chest x-ray showed mild cardiomegaly with possible asymmetric pulmonary edema. BNP was 36,455. Because of deteriorating respiratory status intubation and mechanical ventilation was elected. CCM service was consulted. Patient was not administered TPA secondary to ICH. She was admitted to the neuro ICU  for further evaluation and treatment.   SUBJECTIVE (INTERVAL HISTORY) On tube feeds,   Neurologically stable but yet has pulm edema, ventilated and no extubation due to poor MS, CKD and poor urine output but some increase in urine output with IV lasix, renal and respiratory failure on HD. Aggressively dializing but still hypertensive on nicardipene drip and hydralazine prn. HD hold off today. Had neurological worsening with new rt sided weakness but stat head Ct on 10/7 showed stable resolving hematoma without any increase mass effect or midline shift   OBJECTIVE Temp:  [97.6 F (36.4 C)-99.9 F (37.7 C)] 98.8 F  (37.1 C) (10/11 1600) Pulse Rate:  [82-108] 93 (10/11 1800) Cardiac Rhythm:  [-] Normal sinus rhythm (10/11 0800) Resp:  [16-34] 23 (10/11 1800) BP: (111-158)/(36-72) 142/64 mmHg (10/11 1800) SpO2:  [95 %-100 %] 100 % (10/11 1800) FiO2 (%):  [30 %-40 %] 30 % (10/11 1800) Weight:  [272 lb 14.9 oz (123.8 kg)] 272 lb 14.9 oz (123.8 kg) (10/11 0500)   Recent Labs Lab 02/25/14 2338 02/26/14 0332 02/26/14 0741 02/26/14 1148 02/26/14 1607  GLUCAP 155* 125* 150* 157* 139*    Recent Labs Lab 02/21/14 1900 02/22/14 0035 02/23/14 0420 02/23/14 0500 02/24/14 0450 02/25/14 0433 02/26/14 0400  NA 137 140 137  --  140 139 139  K 3.6* 4.0 4.0  --  4.0 3.7 3.7  CL 101 103 104  --  102 99 102  CO2 17* 20 16*  --  GLUCOSE 131* 160* 116*  --  137* 135* 129*  BUN 42* 43* 60*  --  67* 69* 59*  CREATININE 3.58* 4.14* 5.70*  --  5.84* 5.72* 4.18*  CALCIUM 9.3 8.5 8.6  --  8.4 8.7 7.9*  MG  --  1.9  --  1.9  --   --  2.0  PHOS  --  6.5*  --  5.9*  --  6.3* 5.0*    Recent Labs Lab 02/21/14 1900 02/24/14 0450 02/25/14 0433 02/26/14 0400  AST 25 20  --   --   ALT 30 16  --   --   ALKPHOS 152* 112  --   --  BILITOT 0.9 0.6  --   --   PROT 7.9 6.7  --   --   ALBUMIN 3.7 2.8* 2.8* 2.4*    Recent Labs Lab 02/21/14 1900 02/22/14 0035 02/23/14 0420 02/24/14 0450 02/25/14 0433 02/26/14 0400  WBC 14.6* 21.7* 10.8* 13.5* 13.8* 14.6*  NEUTROABS 12.4* 19.1*  --   --   --   --   HGB 11.4* 10.4* 8.9* 8.6* 8.4* 8.2*  HCT 35.2* 32.8* 27.7* 27.1* 26.7* 25.1*  MCV 85.0 88.6 86.3 85.2 84.8 85.1  PLT 329 334 225 215 218 235    Recent Labs Lab 02/21/14 1900 02/22/14 0640 02/22/14 1506  TROPONINI <0.30 2.66* 4.24*   No results found for this basename: LABPROT, INR,  in the last 72 hours No results found for this basename: COLORURINE, APPERANCEUR, LABSPEC, PHURINE, GLUCOSEU, HGBUR, BILIRUBINUR, KETONESUR, PROTEINUR, UROBILINOGEN, NITRITE, LEUKOCYTESUR,  in the last 72 hours   No results found for this basename: chol,  trig,  hdl,  cholhdl,  vldl,  ldlcalc   Lab Results  Component Value Date   HGBA1C 5.8* 02/23/2014   No results found for this basename: labopia,  cocainscrnur,  labbenz,  amphetmu,  thcu,  labbarb    No results found for this basename: ETH,  in the last 168 hours  Ct Head Wo Contrast 02/22/2014   1. No significant interval change in the patient's intraparenchymal hemorrhage at the right lateral basal ganglia, measuring 4.1 x 1.2 cm, with mild surrounding vasogenic edema. Mild associated mass effect, with 4 mm of leftward midline shift again seen. 2. Scattered small vessel ischemic microangiopathy. 3. Bilateral proptosis noted.    02/21/2014   1. Acute hemorrhage into the lateral right basal ganglia, likely indicating a hypertensive hemorrhage/hemorrhagic stroke. 2. Mild mass effect with shift of the midline to the left approximating 5 mm. Critical   US Renal Port 02/22/2014    1. Small echogenic kidneys consistent with chronic renal medical disease. No hydronephrosis. 2. The urinary bladder is decompressed by Foley catheter and cannot be evaluated.     Dg Chest Port 1 View 02/22/2014    1. Satisfactory positioning of endotracheal tube and right IJ line. 2. Cardiomegaly with persistent diffuse interstitial and airspace disease likely reflecting edema and congestive heart failure. 3. Bilateral pleural effusions and basilar airspace disease. This likely reflects atelectasis.    02/21/2014    Mild cardiac enlargement and possible asymmetric pulmonary edema. However, exam is limited by body habitus.      PHYSICAL EXAM Young obese Caucasian lady not in distress..Intubated and sedated. Afebrile. Head is nontraumatic. Neck is supple without bruit.    l. Cardiac exam soft ejection murmur RRR .no gallop. Lungs bilateral crackles and  rhonchi to auscultation. Distal pulses are well felt. Gen: intubated and sedated   Neurological Exam : intubated sedated on  Fentanyl. Slight downgaze preference. Eyes conjugate. Pupils 3 mm reactive. Fundi not visualized. Left lower face weakness. Moves rt upper extremity  less than left. Moving left extremities and right leg spontaneously.  Does not move extremities to painful stimuli. Left Plantar upgoing and right down going.  ASSESSMENT/PLAN Lynn Gentry is a 43 y.o. female with no documented medical history found with new onset slurred speech and left-sided weakness.   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 - Right vertebral artery not imaged due to central line dressing. Left vertebral artery not imaged due to patient position   2D Echo  Left ventricle: The cavity size was normal. There was severe concentric hypertrophy. Systolic function was normal. The estimated ejection fraction was in the range of 60% to 65%. Wall motion was normal; there were no regional wall motion abnormalities.   HgbA1c 5.8  SCDs for VTE prophylaxis  Suspect will likely need trach and PEG  Resultant VDRF, left hemiparesis  no antithrombotics prior to admission  Ongoing aggressive risk factor management  Therapy recommendations:  pending  Disposition:  pending  Acute Respiratory Failure with severe acute pulmonary edema  Intubated  Sedated  CCM following  Diuresing, IV Lasix, Hemodialysis, urine output minimal today   Malignant Hypertension  BP 226/111 at onset  Home meds:   none  SBP goal < 140 due to presence of end organ damage  On cardene drip  BP 106-226/54-111 past 24h (02/26/2014 @ 6:30 PM)  Unstable   Acute renal failure with volume  overload, suspect baseline chronic kidney disease  Diuresis and management per critical care, on HD  Other Stroke Risk Factors ETOH use   Morbid Obesity, Body mass index is 46.83 kg/(m^2).   Cancel MRI scan of brain as patient cannot lay flat due to pulmonary edema.  Hospital day # 5 02/26/2014 6:30 PM  This patient is critically ill and at significant risk of neurological worsening, death and care requires constant monitoring of vital signs, hemodynamics,respiratory and cardiac monitoring,review of multiple databases, neurological assessment, discussion with family, other specialists and medical decision making of high complexity.I have made any additions or clarifications directly to the above note I spent 30 minutes of neurocritical care time  in the care of  this patient.  I have personally examined this patient, reviewed notes, independently viewed imaging studies, participated in medical decision making and plan of care. I have made any additions or clarifications directly to the above note. Agree with note above.   Artemio Alyoni Ahern, MD  [facility]  Pager: [phone] 02/24/2014 5:16 PM     To contact Stroke Continuity provider, please refer to [email or web address]. After hours, contact General Neurology

## 2014-02-26 NOTE — Progress Notes (Signed)
Patient progressed to responding less to stimuli, not localizing as before, therefore Stroke services contacted concerning change. Orders given for STAT CT of head without contrast. Will continue to monitor.

## 2014-02-26 NOTE — Progress Notes (Signed)
PULMONARY / CRITICAL CARE MEDICINE   Name: Lynn Gentry MRN: [id number] DOB: January 22, 1971    ADMISSION DATE:  02/21/2014 CONSULTATION DATE:  02/26/2014  REFERRING MD :  Pearlean Brownie  CHIEF COMPLAINT:  Respiratory failure in setting of ICH  INITIAL PRESENTATION: 43 y.o. F brought to [facility] ED on 10/6 with left sided weakness, slurred speech, inability to stand up.  In ED, found to have acute lateral right basal ganglia ICH with mild mass effect and 72mm MLS.  Admitted to neuro ICU and developed acute respiratory failure, failed bipap and PCCM called for intubation and management. She was admitted to neuro ICU and later that evening had increasing O2 demands.  She had no improvement in SpO2, work of breathing, and mental status despite 3 hours of BiPAP.  PCCM was consulted for intubation.  STUDIES:  10/06 CT head:  acute hemorrhage into the lateral right basal ganglia, likely indicating a hypertensive hemorrhage/hemorrhagic stroke.  Mild mass effect with 67mm MLS to the left. 10/07 Renal US:  echogenic kidneys s/w ckd, no hydro.  10/07 CT head: [facility] 10/07 repeat CT head: [facility] 10/07 Doppler carotid: consistent with 1-39% stenosis involving the R internal carotid art and the L internal carotid art 10/07 TTE: LVEF 60-65%. Severe concentric LVH. LA dilatation. PASP est 43 mmHg 10/09 CT head: [facility] 10/09 LE venous Dopplers (ordered by Stroke) >>   SIGNIFICANT EVENTS: 10/6 - admitted for ICH, developed respiratory failure requiring intubation. 10/07 Persistent hypertension. Remained on nicardipine gtt. Persistent volume overload snd pulm edema pattern on CXR. Lasix gtt resulted in minimal increase in Uo 10/08 Worsening renal function, oliguria. Renal consult. HD performed 10/09 Acute change in neuro status. STAT CT head ordered: [facility] 10/09 Further HD planned  VITAL SIGNS: Temp:  [97.6 F (36.4 C)-99.9 F (37.7 C)] 99.9 F (37.7 C) (10/11 0743) Pulse Rate:  [81-98] 97 (10/11 0700) Resp:  [15-27] 25  (10/11 0813) BP: (117-162)/(42-80) 143/67 mmHg (10/11 0813) SpO2:  [95 %-100 %] 100 % (10/11 0700) FiO2 (%):  [30 %-40 %] 30 % (10/11 0813) Weight:  [120.7 kg (266 lb 1.5 oz)-124.7 kg (274 lb 14.6 oz)] 123.8 kg (272 lb 14.9 oz) (10/11 0500)  HEMODYNAMICS: CVP:  [13 mmHg-26 mmHg] 21 mmHg  VENTILATOR SETTINGS: Vent Mode:  [-] PRVC FiO2 (%):  [30 %-40 %] 30 % Set Rate:  [18 bmp] 18 bmp Vt Set:  [440 mL] 440 mL PEEP:  [5 cmH20] 5 cmH20 Plateau Pressure:  [17 cmH20-24 cmH20] 17 cmH20  INTAKE / OUTPUT: Intake/Output     10/10 0701 - 10/11 0700 10/11 0701 - 10/12 0700   I.V. (mL/kg) 1891 (15.3) 150 (1.2)   NG/GT 1050 40   IV Piggyback 198    Total Intake(mL/kg) 3139 (25.4) 190 (1.5)   Urine (mL/kg/hr) 235 (0.1)    Other 4000 (1.3)    Total Output 4235     Net -1096 +190         PHYSICAL EXAMINATION: General: Comatose  Neuro: Unresponsive, localizes with RUE > LUE HEENT: [place]/AT. PERRL Cardiovascular: RRR, no M  Lungs: No wheezes Abdomen: Obese, soft, +BS Ext: symmetric BUE > BLE edema   LABS:  I have reviewed all of today's lab results. Relevant abnormalities are discussed in the A/P section  CXR: NSC CM, pulm edema pattern  ASSESSMENT / PLAN:  PULMONARY ETT 10/6 >>> A: Acute hypoxemic respiratory failure Acute pulmonary edema  - cardiogenic P:   PS trials Cont vent bundle Daily SBT if/when meets criteria No  extubation due to mental status  CARDIOVASCULAR CVL R IJ 10/6 >>  A:  HTN crisis  Severe pulmonary edema Acute systolic v diastolic chf Elevated troponin (pk 4.24 10/07) P:  Wean nicardipine to off maintaining SBP < 140 mmHg Increase lopressor 25 mg PO BID with holding parameters Increase norvasc 10 mg PO daily with holding parameters PRN hydralazine ordered 10/09 PRN metoprolol ordered 10/09 Not a candidate for aggressive cardiac intervention presently - Cards not called yet  RENAL L IJ HD cath 10/08 >>  A:   Acute renal failure,  oliguric Suspected CKD - likely hypertensive volume overload P:   Monitor BMET intermittently Monitor I/Os Correct electrolytes as indicated HD per renal  GASTROINTESTINAL A:   Obesity P:   SUP: Pantoprazole Cont TFs  HEMATOLOGIC A:   ICU acquired anemia P:  DVT px: SCDs Monitor CBC intermittently Transfuse per usual ICU guidelines  INFECTIOUS A:   No overt infection P:   Monitor off abx   ENDOCRINE A:   Hyperglycemia, controlled P:   CBG's q4hr SSI  NEUROLOGIC A:   Hypertensive R basal ganglion hemorrhage  Acute encephalopathy P:   Mgmt per Stroke team Sedation: fentanyl drip, PRN midaz RASS goal: -1 to -2 Daily WUA.  FAMILY No family bedside today  TODAY'S SUMMARY: Double anti-HTN today, continue diureses, HD per renal, if family wishes for trach/PEG after speaking with neuro can perform this week.  35 mins CCM time  *Care during the described time interval was provided by me and/or other providers on the critical care team. I have reviewed this patient's available data, including medical history, events of note, physical examination and test results as part of my evaluation.  Alyson ReedyWesam G. Vickey Boak, M.D. [facility]. Pager: [phone]. After hours pager: [phone].

## 2014-02-26 NOTE — Progress Notes (Signed)
Patient back to baseline. Assessment called to Stroke services, where STAT CT of head was cancelled. Will continue to monitor.

## 2014-02-26 NOTE — Progress Notes (Signed)
**Note De-Identified Lynn Gentry Obfuscation** Pulled ETT back 2cm per CXR; currently 23 @ lip

## 2014-02-27 ENCOUNTER — Inpatient Hospital Stay (HOSPITAL_COMMUNITY): Payer: 59

## 2014-02-27 DIAGNOSIS — I610 Nontraumatic intracerebral hemorrhage in hemisphere, subcortical: Principal | ICD-10-CM

## 2014-02-27 LAB — CBC
HEMATOCRIT: 27.1 % — AB (ref 36.0–46.0)
Hemoglobin: 8.6 g/dL — ABNORMAL LOW (ref 12.0–15.0)
MCH: 27.3 pg (ref 26.0–34.0)
MCHC: 31.7 g/dL (ref 30.0–36.0)
MCV: 86 fL (ref 78.0–100.0)
Platelets: 273 10*3/uL (ref 150–400)
RBC: 3.15 MIL/uL — AB (ref 3.87–5.11)
RDW: 15.4 % (ref 11.5–15.5)
WBC: 18.6 10*3/uL — ABNORMAL HIGH (ref 4.0–10.5)

## 2014-02-27 LAB — POCT I-STAT 3, ART BLOOD GAS (G3+)
Acid-Base Excess: 4 mmol/L — ABNORMAL HIGH (ref 0.0–2.0)
BICARBONATE: 28.1 meq/L — AB (ref 20.0–24.0)
O2 SAT: 100 %
PCO2 ART: 37.2 mmHg (ref 35.0–45.0)
PH ART: 7.485 — AB (ref 7.350–7.450)
PO2 ART: 331 mmHg — AB (ref 80.0–100.0)
Patient temperature: 98.4
TCO2: 29 mmol/L (ref 0–100)

## 2014-02-27 LAB — PROTEIN ELECTROPHORESIS, SERUM
ALBUMIN ELP: 48.5 % — AB (ref 55.8–66.1)
Alpha-1-Globulin: 13.5 % — ABNORMAL HIGH (ref 2.9–4.9)
Alpha-2-Globulin: 10.4 % (ref 7.1–11.8)
BETA 2: 5.7 % (ref 3.2–6.5)
Beta Globulin: 7 % (ref 4.7–7.2)
GAMMA GLOBULIN: 14.9 % (ref 11.1–18.8)
M-Spike, %: NOT DETECTED g/dL
Total Protein ELP: 5.8 g/dL — ABNORMAL LOW (ref 6.0–8.3)

## 2014-02-27 LAB — BLOOD GAS, ARTERIAL
ACID-BASE DEFICIT: 4.3 mmol/L — AB (ref 0.0–2.0)
Bicarbonate: 20.4 mEq/L (ref 20.0–24.0)
DRAWN BY: 331761
FIO2: 0.4 %
LHR: 18 {breaths}/min
O2 Saturation: 96.8 %
PATIENT TEMPERATURE: 98.8
PEEP/CPAP: 5 cmH2O
TCO2: 21.6 mmol/L (ref 0–100)
VT: 440 mL
pCO2 arterial: 38.5 mmHg (ref 35.0–45.0)
pH, Arterial: 7.345 — ABNORMAL LOW (ref 7.350–7.450)
pO2, Arterial: 98.3 mmHg (ref 80.0–100.0)

## 2014-02-27 LAB — GLUCOSE, CAPILLARY
Glucose-Capillary: 119 mg/dL — ABNORMAL HIGH (ref 70–99)
Glucose-Capillary: 123 mg/dL — ABNORMAL HIGH (ref 70–99)
Glucose-Capillary: 102 mg/dL — ABNORMAL HIGH (ref 70–99)
Glucose-Capillary: 152 mg/dL — ABNORMAL HIGH (ref 70–99)
Glucose-Capillary: 131 mg/dL — ABNORMAL HIGH (ref 70–99)

## 2014-02-27 LAB — MAGNESIUM: Magnesium: 2.5 mg/dL (ref 1.5–2.5)

## 2014-02-27 LAB — BASIC METABOLIC PANEL
ANION GAP: 20 — AB (ref 5–15)
BUN: 96 mg/dL — AB (ref 6–23)
CALCIUM: 9.1 mg/dL (ref 8.4–10.5)
CO2: 20 meq/L (ref 19–32)
CREATININE: 3.93 mg/dL — AB (ref 0.50–1.10)
Chloride: 98 mEq/L (ref 96–112)
GFR calc Af Amer: 15 mL/min — ABNORMAL LOW (ref >90)
GFR calc non Af Amer: 13 mL/min — ABNORMAL LOW (ref >90)
GLUCOSE: 129 mg/dL — AB (ref 70–99)
Potassium: 4.2 mEq/L (ref 3.7–5.3)
Sodium: 138 mEq/L (ref 137–147)

## 2014-02-27 LAB — PHOSPHORUS: Phosphorus: 7 mg/dL — ABNORMAL HIGH (ref 2.3–4.6)

## 2014-02-27 MED ORDER — CLONIDINE HCL 0.2 MG/24HR TD PTWK
0.2000 mg | MEDICATED_PATCH | TRANSDERMAL | Status: DC
  Administered 2014-02-27: 0.2 mg via TRANSDERMAL
  Filled 2014-02-27: qty 1

## 2014-02-27 MED ORDER — PROPOFOL 10 MG/ML IV EMUL
INTRAVENOUS | Status: AC
  Filled 2014-02-27: qty 100

## 2014-02-27 MED ORDER — PROPOFOL 10 MG/ML IV EMUL
5.0000 ug/kg/min | INTRAVENOUS | Status: DC
  Administered 2014-02-27: 20 ug/kg/min via INTRAVENOUS

## 2014-02-27 MED ORDER — DARBEPOETIN ALFA-POLYSORBATE 100 MCG/0.5ML IJ SOLN
INTRAMUSCULAR | Status: AC
  Administered 2014-02-27: 100 ug
  Filled 2014-02-27: qty 0.5

## 2014-02-27 NOTE — Progress Notes (Signed)
Patient ID: Lynn Gentry, female   DOB: 09-Mar-1971, 43 y.o.   MRN: [id number] S:Intubated/sedated O:BP 141/67  Pulse 99  Temp(Src) 98.7 F (37.1 C) (Oral)  Resp 21  Ht 5\' 4"  (1.626 m)  Wt 121.4 kg (267 lb 10.2 oz)  BMI 45.92 kg/m2  SpO2 100%  Intake/Output Summary (Last 24 hours) at 02/27/14 1339 Last data filed at 02/27/14 1206  Gross per 24 hour  Intake 2967.78 ml  Output   4274 ml  Net -1306.22 ml   Intake/Output: I/O last 3 completed shifts: In: 4804.3 [I.V.:2944.3; NG/GT:1530; IV Piggyback:330] Out: 388 [Urine:388]  Intake/Output this shift:  Total I/O In: 717.5 [I.V.:317.5; NG/GT:400] Out: 4071 [Urine:20; Other:4051] Weight change: 1.2 kg (2 lb 10.3 oz) Gen:WD obese, hirsuit AAF intubated CVS:RRR, no rub Resp:cta VFI:EPPIR Ext:+anasarca   Recent Labs Lab 02/21/14 1900 02/22/14 0035 02/23/14 0420 02/23/14 0500 02/24/14 0450 02/25/14 0433 02/26/14 0400 02/27/14 0415  NA 137 140 137  --  140 139 139 138  K 3.6* 4.0 4.0  --  4.0 3.7 3.7 4.2  CL 101 103 104  --  102 99 102 98  CO2 17* 20 16*  --  20 21 20 20   GLUCOSE 131* 160* 116*  --  137* 135* 129* 129*  BUN 42* 43* 60*  --  67* 69* 59* 96*  CREATININE 3.58* 4.14* 5.70*  --  5.84* 5.72* 4.18* 3.93*  ALBUMIN 3.7  --   --   --  2.8* 2.8* 2.4*  --   CALCIUM 9.3 8.5 8.6  --  8.4 8.7 7.9* 9.1  PHOS  --  6.5*  --  5.9*  --  6.3* 5.0* 7.0*  AST 25  --   --   --  20  --   --   --   ALT 30  --   --   --  16  --   --   --    Liver Function Tests:  Recent Labs Lab 02/21/14 1900 02/24/14 0450 02/25/14 0433 02/26/14 0400  AST 25 20  --   --   ALT 30 16  --   --   ALKPHOS 152* 112  --   --   BILITOT 0.9 0.6  --   --   PROT 7.9 6.7  --   --   ALBUMIN 3.7 2.8* 2.8* 2.4*   No results found for this basename: LIPASE, AMYLASE,  in the last 168 hours No results found for this basename: AMMONIA,  in the last 168 hours CBC:  Recent Labs Lab 02/21/14 1900 02/22/14 0035 02/23/14 0420 02/24/14 0450  02/25/14 0433 02/26/14 0400 02/27/14 0415  WBC 14.6* 21.7* 10.8* 13.5* 13.8* 14.6* 18.6*  NEUTROABS 12.4* 19.1*  --   --   --   --   --   HGB 11.4* 10.4* 8.9* 8.6* 8.4* 8.2* 8.6*  HCT 35.2* 32.8* 27.7* 27.1* 26.7* 25.1* 27.1*  MCV 85.0 88.6 86.3 85.2 84.8 85.1 86.0  PLT 329 334 225 215 218 235 273   Cardiac Enzymes:  Recent Labs Lab 02/21/14 1900 02/22/14 0640 02/22/14 1506  TROPONINI <0.30 2.66* 4.24*   CBG:  Recent Labs Lab 02/26/14 2024 02/26/14 2343 02/27/14 0354 02/27/14 0810 02/27/14 1142  GLUCAP 153* 145* 119* 123* 102*    Iron Studies: No results found for this basename: IRON, TIBC, TRANSFERRIN, FERRITIN,  in the last 72 hours Studies/Results: Dg Chest Port 1 View  02/27/2014   CLINICAL DATA:  43 year old with respiratory distress. The  patient is intubated.  EXAM: PORTABLE CHEST - 1 VIEW  COMPARISON:  02/26/2014  FINDINGS: Endotracheal tube is 3.8 cm above the carina. Nasogastric tube extends towards the abdomen. Left jugular catheter tip is in the lower SVC. Right jugular central venous catheter tip is in the SVC region. Low lung volumes with elevation of the right hemidiaphragm. Vascular crowding and difficult to exclude mild pulmonary edema. Heart size is upper limits of normal. Negative for a pneumothorax.  IMPRESSION: Support apparatuses are appropriately positioned.  Low lung volumes and difficult to exclude mild edema.   Electronically Signed   By: Richarda Overlie M.D.   On: 02/27/2014 07:43   Dg Chest Port 1 View  02/26/2014   CLINICAL DATA:  Hypoxia  EXAM: PORTABLE CHEST - 1 VIEW  COMPARISON:  February 25, 2014  FINDINGS: Endotracheal tube tip is at the carina. Left central catheter tip is at the cavoatrial junction. Right central catheter tip is in the superior vena cava near the cavoatrial junction. Nasogastric tube tip and side port are in the stomach.  The lungs are clear. Heart is mildly enlarged with pulmonary vascularity within normal limits. No adenopathy.   IMPRESSION: Tube and catheter positions as described without pneumothorax. Note that the endotracheal tube tip is at the carina. Mild cardiomegaly. No lung edema or consolidation.  Critical Value/emergent results were called by telephone at the time of interpretation on 02/26/2014 at 8:14 am to Jani Files, RN, who verbally acknowledged these results.   Electronically Signed   By: Bretta Bang M.D.   On: 02/26/2014 08:14   . amLODipine  10 mg Oral Daily  . antiseptic oral rinse  7 mL Mouth Rinse QID  . chlorhexidine  15 mL Mouth Rinse BID  . cloNIDine  0.2 mg Transdermal Q Mon  . darbepoetin (ARANESP) injection - DIALYSIS  100 mcg Intravenous Q Mon-HD  . feeding supplement (PRO-STAT SUGAR FREE 64)  60 mL Per Tube BID  . feeding supplement (VITAL HIGH PROTEIN)  1,000 mL Per Tube Q24H  . furosemide  160 mg Intravenous 3 times per day  . insulin aspart  0-15 Units Subcutaneous 6 times per day  . metoprolol tartrate  25 mg Per Tube BID  . multivitamin  5 mL Per Tube Daily  . pantoprazole sodium  40 mg Per Tube Q1200  . senna-docusate  1 tablet Per Tube BID    BMET    Component Value Date/Time   NA 138 02/27/2014 0415   K 4.2 02/27/2014 0415   CL 98 02/27/2014 0415   CO2 20 02/27/2014 0415   GLUCOSE 129* 02/27/2014 0415   BUN 96* 02/27/2014 0415   CREATININE 3.93* 02/27/2014 0415   CALCIUM 9.1 02/27/2014 0415   GFRNONAA 13* 02/27/2014 0415   GFRAA 15* 02/27/2014 0415   CBC    Component Value Date/Time   WBC 18.6* 02/27/2014 0415   RBC 3.15* 02/27/2014 0415   HGB 8.6* 02/27/2014 0415   HCT 27.1* 02/27/2014 0415   PLT 273 02/27/2014 0415   MCV 86.0 02/27/2014 0415   MCH 27.3 02/27/2014 0415   MCHC 31.7 02/27/2014 0415   RDW 15.4 02/27/2014 0415   LYMPHSABS 1.7 02/22/2014 0035   MONOABS 0.9 02/22/2014 0035   EOSABS 0.0 02/22/2014 0035   BASOSABS 0.0 02/22/2014 0035     Assessment/Plan:  1. AKI vs. Subacute vs. AKI/CKD- oliguric requiring dialysis. S/p 4 HD treatments (  has UF'd 14.5L thus far) 2. R BG hemorrhagic stroke- 3. VDRF- per  PCCM 4. Malignant HTN and volume overload- improving with aggressive HD and UF.  Ok to stop lasix and she remains oliguric.  5. Metabolic acidosis- improved with HD 6. ABLA- transfuse prn per PCCM 7. DM- per primary svc 8. Obesity- 9. Protein malnutrition 10. dispo- unclear prognosis, hopefully she will start to exhibit some neurologic improvement as well as renal.  Cont with supportive measures for now, however if she requires a trach, her only option for dialysis would be LTC facility.  Kirkland Figg A

## 2014-02-27 NOTE — Progress Notes (Addendum)
eLink Physician-Brief Progress Note Patient Name: Lynn Gentry DOB: 09/23/1970 MRN: [id number]   Date of Service  02/27/2014  HPI/Events of Note  Vent asynchrony Sounds clear per RN CXR  -mild edema  eICU Interventions  Propofol gtt added for sedation - goal RASS-1 & vent synchrony Mucus plugs suctioned out later     Intervention Category Intermediate Interventions: Respiratory distress - evaluation and management  [name] V. 02/27/2014, 6:39 AM

## 2014-02-27 NOTE — Progress Notes (Signed)
RT notified of patient with vent dysynchrony despite several fentanyl boluses.  Thought patient was biting tube as unable to pass inline suction. Dr. Vassie Loll camera'd in as well. Propofol added. When RT arrived, patient not biting tube, but mucus plug was present. RT able to clear mucus plug. Per Dr. Vassie Loll, turn propofol off.  Will pass along to day RN.

## 2014-02-27 NOTE — Progress Notes (Signed)
PULMONARY / CRITICAL CARE MEDICINE   Name: Kathlee NationsStephanie Hughley MRN: [id number] DOB: 01/15/1971    ADMISSION DATE:  02/21/2014 CONSULTATION DATE:  02/27/2014  REFERRING MD :  Pearlean BrownieSethi  CHIEF COMPLAINT:  Respiratory failure in setting of ICH  INITIAL PRESENTATION: 43 [facility]. F [facility] with left sided weakness, slurred speech, inability to stand up.  In ED, found to have acute lateral right basal ganglia ICH with mild mass effect and 5mm MLS.  Admitted to neuro ICU and developed acute respiratory failure, failed bipap and PCCM called for intubation and management. She was admitted to neuro ICU and later that evening had increasing O2 demands.  She had no improvement in SpO2, work of breathing, and mental status despite 3 hours of BiPAP.  PCCM was consulted for intubation.  STUDIES:  10/06 CT head:  acute hemorrhage into the lateral right basal ganglia, likely indicating a hypertensive hemorrhage/hemorrhagic stroke.  Mild mass effect with 5mm MLS to the left. 10/07 Renal US:  echogenic kidneys s/w ckd, no hydro.  10[facility]07 [facility]07 Doppler carotid: consistent with 1-39% stenosis involving the R internal carotid art and the L internal carotid art 10/07 TTE: LVEF 60-65%. Severe concentric LVH. LA dilatation. PASP est 43 mmHg 10[facility]09 [facility] LE venous Dopplers (ordered by Stroke) >>   SIGNIFICANT EVENTS: 10/6 - admitted for ICH, developed respiratory failure requiring intubation. 10/07 Persistent hypertension. Remained on nicardipine gtt. Persistent volume overload snd pulm edema pattern on CXR. Lasix gtt resulted in minimal increase in Uo 10/08 Worsening renal function, oliguria. Renal consult. HD performed 10/09 Acute change in neuro status. STAT CT [facility] Further HD planned  VITAL SIGNS: Temp:  [98.6 F (37 C)-99.2 F (37.3 C)] 99.2 F (37.3 C) (10/12 0800) Pulse Rate:  [81-108] 94 (10/12 0915) Resp:  [20-34] 21 (10/12  0915) BP: (111-161)/(36-74) 138/58 mmHg (10/12 0915) SpO2:  [92 %-100 %] 100 % (10/12 0915) FiO2 (%):  [30 %-80 %] 80 % (10/12 0915) Weight:  [125.4 kg (276 lb 7.3 oz)-125.9 kg (277 lb 9 oz)] 125.4 kg (276 lb 7.3 oz) (10/12 0755)  HEMODYNAMICS: CVP:  [10 mmHg-26 mmHg] 10 mmHg  VENTILATOR SETTINGS: Vent Mode:  [-] PRVC FiO2 (%):  [30 %-80 %] 80 % Set Rate:  [18 bmp] 18 bmp Vt Set:  [440 mL] 440 mL PEEP:  [5 cmH20] 5 cmH20 Plateau Pressure:  [23 cmH20-24 cmH20] 24 cmH20  INTAKE / OUTPUT: Intake/Output     10/11 0701 - 10/12 0700 10/12 0701 - 10/13 0700   I.V. (mL/kg) 1924.3 (15.3) 95.4 (0.8)   NG/GT 960 40   IV Piggyback 132    Total Intake(mL/kg) 3016.3 (24) 135.4 (1.1)   Urine (mL/kg/hr) 263 (0.1)    Other     Total Output 263     Net +2753.3 +135.4         PHYSICAL EXAMINATION: General: Comatose, not following commands Neuro: Unresponsive, localizes with RUE > LUE HEENT: [place]/AT. PERRL Cardiovascular: RRR, no M  Lungs: No wheezes Abdomen: Obese, soft, +BS Ext: symmetric BUE > BLE edema   LABS:  I have reviewed all of today's lab results. Relevant abnormalities are discussed in the A/P section  CXR: NSC CM, pulm edema pattern  ASSESSMENT / PLAN:  PULMONARY ETT 10/6 >>> A: Acute hypoxemic respiratory failure Acute pulmonary edema  - cardiogenic P:   Change to PCV Needs aggressive volume off Cont vent bundle  Daily SBT if/when meets criteria No extubation due to mental status Will plan on a trach later on this week  CARDIOVASCULAR CVL R IJ 10/6 >>  A:  HTN crisis  Severe pulmonary edema Acute systolic v diastolic chf Elevated troponin (pk 4.24 10/07) P:  Wean nicardipine to off maintaining SBP < 140 mmHg Continue lopressor 25 mg PO BID with holding parameters Continue norvasc 10 mg PO daily with holding parameters Catapres 0.2 mg to skin PRN hydralazine ordered 10/09 PRN metoprolol ordered 10/09 Not a candidate for aggressive cardiac intervention  presently - Cards not called yet  RENAL L IJ HD cath 10/08 >>  A:   Acute renal failure, oliguric Suspected CKD - likely hypertensive volume overload P:   Monitor BMET intermittently Monitor I/Os Correct electrolytes as indicated HD per renal, -4 liter today  GASTROINTESTINAL A:   Obesity P:   SUP: Pantoprazole Cont TFs  HEMATOLOGIC A:   ICU acquired anemia P:  DVT px: SCDs Monitor CBC intermittently Transfuse per usual ICU guidelines  INFECTIOUS A:   No overt infection P:   Monitor off abx   ENDOCRINE A:   Hyperglycemia, controlled P:   CBG's q4hr SSI  NEUROLOGIC A:   Hypertensive R basal ganglion hemorrhage  Acute encephalopathy P:   Mgmt per Stroke team Sedation: fentanyl drip, PRN midaz RASS goal: -1 to -2 Daily WUA.  FAMILY No family bedside today  TODAY'S SUMMARY: Add catapres patch today, continue PO BP control medications, change to PCV given asynchrony and once FiO2 is lower will consider tracheostomy.  35 mins CCM time  *Care during the described time interval was provided by me and/or other providers on the critical care team. I have reviewed this patient's available data, including medical history, events of note, physical examination and test results as part of my evaluation.  Alyson Reedy, M.D. [facility] Pulmonary/Critical Care Medicine. Pager: [phone]. After hours pager: [phone].

## 2014-02-27 NOTE — Progress Notes (Signed)
Dr. Vassie Loll called to clarify SBP goals. Goal less than 140 .WIll monitor.

## 2014-02-27 NOTE — Progress Notes (Signed)
STROKE TEAM PROGRESS NOTE   HISTORY Lynn Gentry is an 43 y.o. female with no previously documented medical disorder who was brought to [facility] with new onset slurred speech and left-sided weakness. Patient went to work as usual until 1500. She was found by her son at home at 1600 unable to get up out of a chair with left sided weakness, slurred speech. Patient was last known well around 3 PM this afternoon 02/21/2014. CT scan of her head showed an acute 4.1 x 1.5 cm right basal ganglia hemorrhage with mild mass effect and right to left shift of 5 mm. Patient's blood pressure was markedly elevated at 226/111. She was started on Cardene drip and blood pressure responded well. She has shown continual respiratory difficulty. She was initially given oxygen by nasal cannula followed by NBR, and subsequently placed on BiPAP. She continued to have oxygen saturations only in the high 80s to low 90s on 100% O2, with a respiratory rates in the 40s. ABG showed pH of 2.82, PCO2 37 PO2 of 57. Chest x-ray showed mild cardiomegaly with possible asymmetric pulmonary edema. BNP was 36,455. Because of deteriorating respiratory status intubation and mechanical ventilation was elected. CCM service was consulted. Patient was not administered TPA secondary to ICH. She was admitted to the neuro ICU  for further evaluation and treatment.   SUBJECTIVE (INTERVAL HISTORY) Pt less responsive this morning due to multiple fentanyl given for cough and agitation. On HD this am during rounds. Still on nicardipene drip due to HTN on A-line reading. Will change to cuff reading. Head Ct on 10/9 showed stable resolving hematoma.   OBJECTIVE Temp:  [98.6 F (37 C)-99.3 F (37.4 C)] 98.7 F (37.1 C) (10/12 1206) Pulse Rate:  [81-103] 94 (10/12 1445) Cardiac Rhythm:  [-] Normal sinus rhythm (10/12 1227) Resp:  [20-29] 24 (10/12 1445) BP: (120-161)/(53-74) 143/68 mmHg (10/12 1445) SpO2:  [92 %-100 %] 97 % (10/12  1445) FiO2 (%):  [30 %-80 %] 40 % (10/12 1445) Weight:  [267 lb 10.2 oz (121.4 kg)-277 lb 9 oz (125.9 kg)] 267 lb 10.2 oz (121.4 kg) (10/12 1206)   Recent Labs Lab 02/26/14 2024 02/26/14 2343 02/27/14 0354 02/27/14 0810 02/27/14 1142  GLUCAP 153* 145* 119* 123* 102*    Recent Labs Lab 02/21/14 1900 02/22/14 0035 02/23/14 0420 02/23/14 0500 02/24/14 0450 02/25/14 0433 02/26/14 0400 02/27/14 0415  NA 137 140 137  --  140 139 139 138  K 3.6* 4.0 4.0  --  4.0 3.7 3.7 4.2  CL 101 103 104  --  102 99 102 98  CO2 17* 20 16*  --  20 21 20 20   GLUCOSE 131* 160* 116*  --  137* 135* 129* 129*  BUN 42* 43* 60*  --  67* 69* 59* 96*  CREATININE 3.58* 4.14* 5.70*  --  5.84* 5.72* 4.18* 3.93*  CALCIUM 9.3 8.5 8.6  --  8.4 8.7 7.9* 9.1  MG  --  1.9  --  1.9  --   --  2.0 2.5  PHOS  --  6.5*  --  5.9*  --  6.3* 5.0* 7.0*    Recent Labs Lab 02/21/14 1900 02/24/14 0450 02/25/14 0433 02/26/14 0400  AST 25 20  --   --   ALT 30 16  --   --   ALKPHOS 152* 112  --   --   BILITOT 0.9 0.6  --   --   PROT 7.9 6.7  --   --  ALBUMIN 3.7 2.8* 2.8* 2.4*    Recent Labs Lab 02/21/14 1900 02/22/14 0035 02/23/14 0420 02/24/14 0450 02/25/14 0433 02/26/14 0400 02/27/14 0415  WBC 14.6* 21.7* 10.8* 13.5* 13.8* 14.6* 18.6*  NEUTROABS 12.4* 19.1*  --   --   --   --   --   HGB 11.4* 10.4* 8.9* 8.6* 8.4* 8.2* 8.6*  HCT 35.2* 32.8* 27.7* 27.1* 26.7* 25.1* 27.1*  MCV 85.0 88.6 86.3 85.2 84.8 85.1 86.0  PLT 329 334 225 215 218 235 273    Recent Labs Lab 02/21/14 1900 02/22/14 0640 02/22/14 1506  TROPONINI <0.30 2.66* 4.24*   No results found for this basename: LABPROT, INR,  in the last 72 hours No results found for this basename: COLORURINE, APPERANCEUR, LABSPEC, PHURINE, GLUCOSEU, HGBUR, BILIRUBINUR, KETONESUR, PROTEINUR, UROBILINOGEN, NITRITE, LEUKOCYTESUR,  in the last 72 hours  No results found for this basename: chol,  trig,  hdl,  cholhdl,  vldl,  ldlcalc   Lab Results   Component Value Date   HGBA1C 5.8* 02/23/2014   No results found for this basename: labopia,  cocainscrnur,  labbenz,  amphetmu,  thcu,  labbarb    No results found for this basename: ETH,  in the last 168 hours  Ct Head Wo Contrast 02/24/2014 Stable appearance of the hematoma within the right basal ganglia. No new or progressive findings.  02/22/2014   1. No significant interval change in the patient's intraparenchymal hemorrhage at the right lateral basal ganglia, measuring 4.1 x 1.2 cm, with mild surrounding vasogenic edema. Mild associated mass effect, with 4 mm of leftward midline shift again seen. 2. Scattered small vessel ischemic microangiopathy. 3. Bilateral proptosis noted.     02/21/2014   1. Acute hemorrhage into the lateral right basal ganglia, likely indicating a hypertensive hemorrhage/hemorrhagic stroke. 2. Mild mass effect with shift of the midline to the left approximating 5 mm. Critical   [place] Renal Port 02/22/2014    1. Small echogenic kidneys consistent with chronic renal medical disease. No hydronephrosis. 2. The urinary bladder is decompressed by Foley catheter and cannot be evaluated.     Dg Chest Port 1 View 02/22/2014    1. Satisfactory positioning of endotracheal tube and right IJ line. 2. Cardiomegaly with persistent diffuse interstitial and airspace disease likely reflecting edema and congestive heart failure. 3. Bilateral pleural effusions and basilar airspace disease. This likely reflects atelectasis.    02/21/2014    Mild cardiac enlargement and possible asymmetric pulmonary edema. However, exam is limited by body habitus.     2D echo - - Left ventricle: The cavity size was normal. There was severe concentric hypertrophy. Systolic function was normal. The estimated ejection fraction was in the range of 60% to 65%. Wall motion was normal; there were no regional wall motion abnormalities. - Left atrium: The atrium was mildly dilated. - Pulmonary arteries: PA peak  pressure: 43 mm Hg (S). - Pericardium, extracardiac: A small, free-flowing pericardial effusion was identified posterior to the heart and circumferential to the heart. The fluid had no internal echoes.There was no evidence of hemodynamic compromise. Impressions: - The right ventricular systolic pressure was increased consistent with moderate pulmonary hypertension.  CUS - - Findings consistent with 1-39 percent stenosis involving the right internal carotid artery and the left internal carotid artery. - Right vertebral artery not imaged due to central line dressing. Left vertebral artery not imaged due to patient position.  DVT - - No obvious evidence of deep vein or superficial thrombosis involving the  right lower extremity and left lower extremity. - No evidence of Baker&'s cyst on the right or left.  A1C 5.8 and LDL pending   PHYSICAL EXAM Young obese Caucasian lady not in distress..Intubated and sedated. Afebrile. Head is nontraumatic. Neck is supple without bruit.    l. Cardiac exam soft ejection murmur RRR .no gallop. Lungs bilateral crackles and  rhonchi to auscultation. Distal pulses are well felt. Gen: intubated and sedated   Neurological Exam : intubated sedated on Fentanyl. Slight downgaze preference. Eyes conjugate. Pupils 3 mm reactive. Fundi not visualized. Left lower face weakness. Moves rt upper extremity  less than left. Moving left extremities and right leg spontaneously.  Does not move extremities to painful stimuli. Left Plantar upgoing and right down going.  ASSESSMENT/PLAN Ms. Lashanta Elbe is a 43 y.o. female with no documented medical history found with new onset slurred speech and left-sided weakness.   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, nondominant side with cytotoxic edema, mild transfalcine herniation, pulmonary edema, respiratory and renal failure  CT head 10/9 showed stable hematoma  MRI and MRA cancelled due  to pt not stable  2D echo unremarkable  Carotid doppler unremarkable  HgbA1c 5.8  SCDs for VTE prophylaxis  will likely need trach and PEG  Resultant VDRF, left hemiparesis  no antithrombotics prior to admission  Ongoing aggressive risk factor management  Therapy recommendations:  pending  Disposition:  pending  Acute Respiratory Failure with severe acute pulmonary edema  Intubated  Sedated  CCM following  Diuresing, IV Lasix, Hemodialysis, urine output minimal today  Malignant Hypertension  BP 226/111 at onset  Home meds:   none  SBP goal < 140 due to presence of end organ damage  continue cardene drip, try to wean off, go by cuff pressure  Put on norvasc per tube and clonidine patch  Acute renal failure with volume overload, suspect baseline chronic kidney disease  Diuresis and management per critical care, on HD  Cre trending down  Other Stroke Risk Factors ETOH use   Morbid Obesity, Body mass index is 45.92 kg/(m^2).   Hospital day # 6 02/27/2014 3:20 PM  This patient is critically ill due to cerebral hemorrhage, respiratory failure, renal failure and at significant risk of neurological worsening, death form hematoma expansion, cerebral edema, brain herniation. This patient's care requires constant monitoring of vital signs, hemodynamics, respiratory and cardiac monitoring, review of multiple databases, neurological assessment, discussion with family, other specialists and medical decision making of high complexity. I spent 40 minutes of neurocritical care time in the care of this patient.   Marvel Plan, MD PhD Stroke Neurology 02/27/2014 3:35 PM   To contact Stroke Continuity provider, please refer to [email or web address]. After hours, contact General Neurology

## 2014-02-28 ENCOUNTER — Inpatient Hospital Stay (HOSPITAL_COMMUNITY): Payer: 59

## 2014-02-28 LAB — BLOOD GAS, ARTERIAL
Acid-base deficit: 0.2 mmol/L (ref 0.0–2.0)
Bicarbonate: 23.5 mEq/L (ref 20.0–24.0)
Drawn by: 36277
FIO2: 0.4 %
LHR: 20 {breaths}/min
O2 Saturation: 98.7 %
PCO2 ART: 35.6 mmHg (ref 35.0–45.0)
PEEP: 5 cmH2O
Patient temperature: 98.6
Pressure control: 20 cmH2O
TCO2: 24.6 mmol/L (ref 0–100)
pH, Arterial: 7.436 (ref 7.350–7.450)
pO2, Arterial: 130 mmHg — ABNORMAL HIGH (ref 80.0–100.0)

## 2014-02-28 LAB — BASIC METABOLIC PANEL
Anion gap: 21 — ABNORMAL HIGH (ref 5–15)
BUN: 83 mg/dL — AB (ref 6–23)
CHLORIDE: 97 meq/L (ref 96–112)
CO2: 21 meq/L (ref 19–32)
Calcium: 9.2 mg/dL (ref 8.4–10.5)
Creatinine, Ser: 4.81 mg/dL — ABNORMAL HIGH (ref 0.50–1.10)
GFR calc non Af Amer: 10 mL/min — ABNORMAL LOW (ref >90)
GFR, EST AFRICAN AMERICAN: 12 mL/min — AB (ref >90)
Glucose, Bld: 139 mg/dL — ABNORMAL HIGH (ref 70–99)
POTASSIUM: 3.8 meq/L (ref 3.7–5.3)
Sodium: 139 mEq/L (ref 137–147)

## 2014-02-28 LAB — GLUCOSE, CAPILLARY
GLUCOSE-CAPILLARY: 124 mg/dL — AB (ref 70–99)
Glucose-Capillary: 136 mg/dL — ABNORMAL HIGH (ref 70–99)
Glucose-Capillary: 142 mg/dL — ABNORMAL HIGH (ref 70–99)
Glucose-Capillary: 146 mg/dL — ABNORMAL HIGH (ref 70–99)
Glucose-Capillary: 154 mg/dL — ABNORMAL HIGH (ref 70–99)
Glucose-Capillary: 164 mg/dL — ABNORMAL HIGH (ref 70–99)
Glucose-Capillary: 166 mg/dL — ABNORMAL HIGH (ref 70–99)

## 2014-02-28 LAB — CBC
HEMATOCRIT: 26.4 % — AB (ref 36.0–46.0)
Hemoglobin: 8.4 g/dL — ABNORMAL LOW (ref 12.0–15.0)
MCH: 26.9 pg (ref 26.0–34.0)
MCHC: 31.8 g/dL (ref 30.0–36.0)
MCV: 84.6 fL (ref 78.0–100.0)
Platelets: 249 10*3/uL (ref 150–400)
RBC: 3.12 MIL/uL — AB (ref 3.87–5.11)
RDW: 15.4 % (ref 11.5–15.5)
WBC: 16.7 10*3/uL — AB (ref 4.0–10.5)

## 2014-02-28 LAB — PHOSPHORUS: Phosphorus: 5.4 mg/dL — ABNORMAL HIGH (ref 2.3–4.6)

## 2014-02-28 LAB — LIPID PANEL
CHOLESTEROL: 177 mg/dL (ref 0–200)
HDL: 75 mg/dL (ref >39)
LDL Cholesterol: 92 mg/dL (ref 0–99)
TRIGLYCERIDES: 51 mg/dL (ref <150)
Total CHOL/HDL Ratio: 2.4 RATIO
VLDL: 10 mg/dL (ref 0–40)

## 2014-02-28 LAB — MAGNESIUM: Magnesium: 2.4 mg/dL (ref 1.5–2.5)

## 2014-02-28 MED ORDER — METOPROLOL TARTRATE 25 MG/10 ML ORAL SUSPENSION
50.0000 mg | Freq: Two times a day (BID) | ORAL | Status: DC
  Administered 2014-03-01 – 2014-03-18 (×35): 50 mg
  Filled 2014-02-28 (×41): qty 20

## 2014-02-28 MED ORDER — HYDRALAZINE HCL 25 MG PO TABS
25.0000 mg | ORAL_TABLET | Freq: Four times a day (QID) | ORAL | Status: DC
  Administered 2014-03-01 (×2): 25 mg via ORAL
  Filled 2014-02-28 (×6): qty 1

## 2014-02-28 MED ORDER — HYDRALAZINE HCL 10 MG PO TABS
10.0000 mg | ORAL_TABLET | Freq: Four times a day (QID) | ORAL | Status: DC
  Administered 2014-02-28 (×3): 10 mg via ORAL
  Filled 2014-02-28 (×5): qty 1

## 2014-02-28 NOTE — Progress Notes (Signed)
Pt's husband signed the consent for the tracheostomy scheduled for 03/02/14 at 1200.  Lynn Gentry R .02/28/2014

## 2014-02-28 NOTE — Progress Notes (Signed)
UR completed. Await medical stability to make any d/c arrangements. Pt with [employer] coverage so LTACH would only be option after hospital day 21(pt is day 7), trach day 7(no trach currently) and 3 failed weans documented. Continue to follow.   Carlyle Lipa, RN BSN MHA CCM Trauma/Neuro ICU Case Manager [phone]

## 2014-02-28 NOTE — Progress Notes (Signed)
Patient ID: Lynn Gentry, female   DOB: 03-Sep-1970, 43 y.o.   MRN: [id number] S:intubated/unresponsive O:BP 127/62  Pulse 90  Temp(Src) 100.1 F (37.8 C) (Axillary)  Resp 28  Ht 5\' 4"  (1.626 m)  Wt 121.5 kg (267 lb 13.7 oz)  BMI 45.96 kg/m2  SpO2 100%  Intake/Output Summary (Last 24 hours) at 02/28/14 1247 Last data filed at 02/28/14 1000  Gross per 24 hour  Intake 1780.96 ml  Output    110 ml  Net 1670.96 ml   Intake/Output: I/O last 3 completed shifts: In: 3716.4 [I.V.:1594.4; NG/GT:1990; IV Piggyback:132] Out: 4286 [Urine:235; Other:4051]  Intake/Output this shift:  Total I/O In: 449.8 [I.V.:169.8; NG/GT:280] Out: 15 [Urine:15] Weight change: -0.5 kg (-1 lb 1.6 oz) WUJ:WJXBJ, hirsuit AAF intubated and unresponsive CVS:no rub Resp:cta YNW:GNFAOZ Ext:+edema   Recent Labs Lab 02/21/14 1900 02/22/14 0035 02/23/14 0420 02/23/14 0500 02/24/14 0450 02/25/14 0433 02/26/14 0400 02/27/14 0415 02/28/14 0350  NA 137 140 137  --  140 139 139 138 139  K 3.6* 4.0 4.0  --  4.0 3.7 3.7 4.2 3.8  CL 101 103 104  --  102 99 102 98 97  CO2 17* 20 16*  --  20 21 20 20 21   GLUCOSE 131* 160* 116*  --  137* 135* 129* 129* 139*  BUN 42* 43* 60*  --  67* 69* 59* 96* 83*  CREATININE 3.58* 4.14* 5.70*  --  5.84* 5.72* 4.18* 3.93* 4.81*  ALBUMIN 3.7  --   --   --  2.8* 2.8* 2.4*  --   --   CALCIUM 9.3 8.5 8.6  --  8.4 8.7 7.9* 9.1 9.2  PHOS  --  6.5*  --  5.9*  --  6.3* 5.0* 7.0* 5.4*  AST 25  --   --   --  20  --   --   --   --   ALT 30  --   --   --  16  --   --   --   --    Liver Function Tests:  Recent Labs Lab 02/21/14 1900 02/24/14 0450 02/25/14 0433 02/26/14 0400  AST 25 20  --   --   ALT 30 16  --   --   ALKPHOS 152* 112  --   --   BILITOT 0.9 0.6  --   --   PROT 7.9 6.7  --   --   ALBUMIN 3.7 2.8* 2.8* 2.4*   No results found for this basename: LIPASE, AMYLASE,  in the last 168 hours No results found for this basename: AMMONIA,  in the last 168  hours CBC:  Recent Labs Lab 02/21/14 1900 02/22/14 0035  02/24/14 0450 02/25/14 0433 02/26/14 0400 02/27/14 0415 02/28/14 0350  WBC 14.6* 21.7*  < > 13.5* 13.8* 14.6* 18.6* 16.7*  NEUTROABS 12.4* 19.1*  --   --   --   --   --   --   HGB 11.4* 10.4*  < > 8.6* 8.4* 8.2* 8.6* 8.4*  HCT 35.2* 32.8*  < > 27.1* 26.7* 25.1* 27.1* 26.4*  MCV 85.0 88.6  < > 85.2 84.8 85.1 86.0 84.6  PLT 329 334  < > 215 218 235 273 249  < > = values in this interval not displayed. Cardiac Enzymes:  Recent Labs Lab 02/21/14 1900 02/22/14 0640 02/22/14 1506  TROPONINI <0.30 2.66* 4.24*   CBG:  Recent Labs Lab 02/27/14 1624 02/27/14 2044 02/28/14 0014 02/28/14 3086  02/28/14 0811  GLUCAP 152* 131* 146* 136* 154*    Iron Studies: No results found for this basename: IRON, TIBC, TRANSFERRIN, FERRITIN,  in the last 72 hours Studies/Results: Dg Chest Port 1 View  02/28/2014   CLINICAL DATA:  43 year old female with acute intracranial hemorrhage. Intubated. Respiratory failure. Initial encounter.  EXAM: PORTABLE CHEST - 1 VIEW  COMPARISON:  02/27/2014 and earlier.  FINDINGS: Portable AP semi upright view at 0532 hrs.  Endotracheal tube tip now between the level the clavicles and carina. Stable left IJ approach dual lumen catheter. Stable right IJ single lumen catheter. Enteric tube courses to the abdomen.  Large body habitus and continued low lung volumes. Stable cardiac size and mediastinal contours. Improved ventilation since 02/21/2014. No pneumothorax, large pleural effusion or consolidation. Perihilar atelectasis suspected.  IMPRESSION: 1. Endotracheal tube in good position. Otherwise, stable lines and tubes. 2. Large body habitus and low lung volumes with atelectasis.   Electronically Signed   By: Augusto GambleLee  Hall M.D.   On: 02/28/2014 07:26   Dg Chest Port 1 View  02/27/2014   CLINICAL DATA:  43 year old with respiratory distress. The patient is intubated.  EXAM: PORTABLE CHEST - 1 VIEW  COMPARISON:   02/26/2014  FINDINGS: Endotracheal tube is 3.8 cm above the carina. Nasogastric tube extends towards the abdomen. Left jugular catheter tip is in the lower SVC. Right jugular central venous catheter tip is in the SVC region. Low lung volumes with elevation of the right hemidiaphragm. Vascular crowding and difficult to exclude mild pulmonary edema. Heart size is upper limits of normal. Negative for a pneumothorax.  IMPRESSION: Support apparatuses are appropriately positioned.  Low lung volumes and difficult to exclude mild edema.   Electronically Signed   By: Richarda OverlieAdam  Henn M.D.   On: 02/27/2014 07:43   . amLODipine  10 mg Oral Daily  . antiseptic oral rinse  7 mL Mouth Rinse QID  . chlorhexidine  15 mL Mouth Rinse BID  . cloNIDine  0.2 mg Transdermal Q Mon  . darbepoetin (ARANESP) injection - DIALYSIS  100 mcg Intravenous Q Mon-HD  . feeding supplement (PRO-STAT SUGAR FREE 64)  60 mL Per Tube BID  . feeding supplement (VITAL HIGH PROTEIN)  1,000 mL Per Tube Q24H  . hydrALAZINE  10 mg Oral 4 times per day  . insulin aspart  0-15 Units Subcutaneous 6 times per day  . metoprolol tartrate  25 mg Per Tube BID  . multivitamin  5 mL Per Tube Daily  . pantoprazole sodium  40 mg Per Tube Q1200  . senna-docusate  1 tablet Per Tube BID    BMET    Component Value Date/Time   NA 139 02/28/2014 0350   K 3.8 02/28/2014 0350   CL 97 02/28/2014 0350   CO2 21 02/28/2014 0350   GLUCOSE 139* 02/28/2014 0350   BUN 83* 02/28/2014 0350   CREATININE 4.81* 02/28/2014 0350   CALCIUM 9.2 02/28/2014 0350   GFRNONAA 10* 02/28/2014 0350   GFRAA 12* 02/28/2014 0350   CBC    Component Value Date/Time   WBC 16.7* 02/28/2014 0350   RBC 3.12* 02/28/2014 0350   HGB 8.4* 02/28/2014 0350   HCT 26.4* 02/28/2014 0350   PLT 249 02/28/2014 0350   MCV 84.6 02/28/2014 0350   MCH 26.9 02/28/2014 0350   MCHC 31.8 02/28/2014 0350   RDW 15.4 02/28/2014 0350   LYMPHSABS 1.7 02/22/2014 0035   MONOABS 0.9 02/22/2014 0035    EOSABS 0.0 02/22/2014 0035  BASOSABS 0.0 02/22/2014 0035     Assessment/Plan:  1. AKI vs. Subacute vs. AKI/CKD- oliguric requiring dialysis. S/p 4 HD treatments ( has UF'd 14.5L thus far) 1. Plan for HD tomorrow for more UP  2. Remains oliguric and dialysis-dependent 2. R BG hemorrhagic stroke- neurosurgery following 1. Await repeat CT scan 3. VDRF- per PCCM 4. Malignant HTN and volume overload- improving with aggressive HD and UF.  1. stopped lasix and she remains oliguric and remains dialysis-dependent.  2. bp much better controlled. 3. Wean cardene gtt as able 5. Metabolic acidosis- improved with HD 6. ABLA- transfuse prn per PCCM 7. DM- per primary svc 8. Obesity- 9. Protein malnutrition 10. dispo- unclear prognosis, hopefully she will start to exhibit some neurologic improvement as well as renal. Cont with supportive measures for now, however if she requires a trach, her only option for dialysis would be LTC facility. 11.   Eric Morganti A

## 2014-02-28 NOTE — Procedures (Signed)
ELECTROENCEPHALOGRAM REPORT  Patient: Lynn Gentry       Room #: 1O10 EEG No. ID: [id number] Age: 43 y.o.        Sex: female Referring Physician: Sung Amabile Report Date:  02/28/2014        Interpreting Physician: Aline Brochure  History: Lynn Gentry is an 43 y.o. female admitted on 02/21/2014 with acute hemorrhagic stroke as well as respiratory failure. CT scan of the head today showed stable right basal ganglia hematoma with mild surrounding vasogenic edema. In addition to continued respiratory failure, patient has also developed renal failure.  Indications for study:  Assess severity of encephalopathy; rule out seizure activity.  Technique: This is an 18 channel routine scalp EEG performed at the bedside with bipolar and monopolar montages arranged in accordance to the international 10/20 system of electrode placement.   Description: Patient was intubated and on mechanical ventilation at the time of this EEG study. She was not on sedating medication during the recording. Predominant activity consisted of low to moderate amplitude diffuse irregular continuous delta activity with superimposed theta activity. Photic stimulation was not performed. No epileptiform discharges were recorded.  Interpretation: This EEG is abnormal with severe generalized slowing of cerebral activity consistent with severe encephalopathic state. These findings are nonspecific and can be seen with metabolic as well as toxic and degenerative encephalopathies. No evidence of an epileptic disorder was demonstrated.   Venetia Maxon M.D. [facility] [phone]

## 2014-02-28 NOTE — Progress Notes (Signed)
STROKE TEAM PROGRESS NOTE   HISTORY Lynn Gentry is an 43 y.o. female with no previously documented medical disorder who was brought to [facility] with new onset slurred speech and left-sided weakness. Patient went to work as usual until 1500. She was found by her son at home at 1600 unable to get up out of a chair with left sided weakness, slurred speech. Patient was last known well around 3 PM this afternoon 02/21/2014. CT scan of her head showed an acute 4.1 x 1.5 cm right basal ganglia hemorrhage with mild mass effect and right to left shift of 5 mm. Patient's blood pressure was markedly elevated at 226/111. She was started on Cardene drip and blood pressure responded well. She has shown continual respiratory difficulty. She was initially given oxygen by nasal cannula followed by NBR, and subsequently placed on BiPAP. She continued to have oxygen saturations only in the high 80s to low 90s on 100% O2, with a respiratory rates in the 40s. ABG showed pH of 2.82, PCO2 37 PO2 of 57. Chest x-ray showed mild cardiomegaly with possible asymmetric pulmonary edema. BNP was 36,455. Because of deteriorating respiratory status intubation and mechanical ventilation was elected. CCM service was consulted. Patient was not administered TPA secondary to ICH. She was admitted to the neuro ICU  for further evaluation and treatment.   SUBJECTIVE (INTERVAL HISTORY) Pt no acute neuro changes overnight. Trace withdraw to pain L>R. Some posturing with nipple pinch. Still on nicardipene drip. Tachycardia. Will repeat CT and do EEG.   OBJECTIVE Temp:  [98.8 F (37.1 C)-100.3 F (37.9 C)] 100.1 F (37.8 C) (10/13 1156) Pulse Rate:  [74-114] 90 (10/13 1145) Cardiac Rhythm:  [-] Sinus tachycardia (10/13 0745) Resp:  [17-36] 28 (10/13 1145) BP: (123-165)/(56-82) 127/62 mmHg (10/13 1145) SpO2:  [90 %-100 %] 100 % (10/13 1145) FiO2 (%):  [40 %-80 %] 40 % (10/13 1204) Weight:  [267 lb 13.7 oz (121.5 kg)] 267 lb  13.7 oz (121.5 kg) (10/13 0600)   Recent Labs Lab 02/27/14 1624 02/27/14 2044 02/28/14 0014 02/28/14 0332 02/28/14 0811  GLUCAP 152* 131* 146* 136* 154*    Recent Labs Lab 02/22/14 0035  02/23/14 0500 02/24/14 0450 02/25/14 0433 02/26/14 0400 02/27/14 0415 02/28/14 0350  NA 140  < >  --  140 139 139 138 139  K 4.0  < >  --  4.0 3.7 3.7 4.2 3.8  CL 103  < >  --  102 99 102 98 97  CO2 20  < >  --  20 21 20 20 21   GLUCOSE 160*  < >  --  137* 135* 129* 129* 139*  BUN 43*  < >  --  67* 69* 59* 96* 83*  CREATININE 4.14*  < >  --  5.84* 5.72* 4.18* 3.93* 4.81*  CALCIUM 8.5  < >  --  8.4 8.7 7.9* 9.1 9.2  MG 1.9  --  1.9  --   --  2.0 2.5 2.4  PHOS 6.5*  --  5.9*  --  6.3* 5.0* 7.0* 5.4*  < > = values in this interval not displayed.  Recent Labs Lab 02/21/14 1900 02/24/14 0450 02/25/14 0433 02/26/14 0400  AST 25 20  --   --   ALT 30 16  --   --   ALKPHOS 152* 112  --   --   BILITOT 0.9 0.6  --   --   PROT 7.9 6.7  --   --  ALBUMIN 3.7 2.8* 2.8* 2.4*    Recent Labs Lab 02/21/14 1900 02/22/14 0035  02/24/14 0450 02/25/14 0433 02/26/14 0400 02/27/14 0415 02/28/14 0350  WBC 14.6* 21.7*  < > 13.5* 13.8* 14.6* 18.6* 16.7*  NEUTROABS 12.4* 19.1*  --   --   --   --   --   --   HGB 11.4* 10.4*  < > 8.6* 8.4* 8.2* 8.6* 8.4*  HCT 35.2* 32.8*  < > 27.1* 26.7* 25.1* 27.1* 26.4*  MCV 85.0 88.6  < > 85.2 84.8 85.1 86.0 84.6  PLT 329 334  < > 215 218 235 273 249  < > = values in this interval not displayed.  Recent Labs Lab 02/21/14 1900 02/22/14 0640 02/22/14 1506  TROPONINI <0.30 2.66* 4.24*   No results found for this basename: LABPROT, INR,  in the last 72 hours No results found for this basename: COLORURINE, APPERANCEUR, LABSPEC, PHURINE, GLUCOSEU, HGBUR, BILIRUBINUR, KETONESUR, PROTEINUR, UROBILINOGEN, NITRITE, LEUKOCYTESUR,  in the last 72 hours     Component Value Date/Time   CHOL 177 02/28/2014 0350   Lab Results  Component Value Date   HGBA1C 5.8*  02/23/2014   No results found for this basename: labopia,  cocainscrnur,  labbenz,  amphetmu,  thcu,  labbarb    No results found for this basename: ETH,  in the last 168 hours  Ct Head Wo Contrast 02/24/2014 Stable appearance of the hematoma within the right basal ganglia. No new or progressive findings.  02/22/2014   1. No significant interval change in the patient's intraparenchymal hemorrhage at the right lateral basal ganglia, measuring 4.1 x 1.2 cm, with mild surrounding vasogenic edema. Mild associated mass effect, with 4 mm of leftward midline shift again seen. 2. Scattered small vessel ischemic microangiopathy. 3. Bilateral proptosis noted.     02/21/2014   1. Acute hemorrhage into the lateral right basal ganglia, likely indicating a hypertensive hemorrhage/hemorrhagic stroke. 2. Mild mass effect with shift of the midline to the left approximating 5 mm. Critical   US Renal Port 02/22/2014    1. Small echogenic kidneys consistent with chronic renal medical disease. No hydronephrosis. 2. The urinary bladder is decompressed by Foley catheter and cannot be evaluated.     Dg Chest Port 1 View 02/22/2014    1. Satisfactory positioning of endotracheal tube and right IJ line. 2. Cardiomegaly with persistent diffuse interstitial and airspace disease likely reflecting edema and congestive heart failure. 3. Bilateral pleural effusions and basilar airspace disease. This likely reflects atelectasis.    02/21/2014    Mild cardiac enlargement and possible asymmetric pulmonary edema. However, exam is limited by body habitus.     2D echo - - Left ventricle: The cavity size was normal. There was severe concentric hypertrophy. Systolic function was normal. The estimated ejection fraction was in the range of 60% to 65%. Wall motion was normal; there were no regional wall motion abnormalities. - Left atrium: The atrium was mildly dilated. - Pulmonary arteries: PA peak pressure: 43 mm Hg (S). - Pericardium,  extracardiac: A small, free-flowing pericardial effusion was identified posterior to the heart and circumferential to the heart. The fluid had no internal echoes.There was no evidence of hemodynamic compromise. Impressions: - The right ventricular systolic pressure was increased consistent with moderate pulmonary hypertension.  CUS - - Findings consistent with 1-39 percent stenosis involving the right internal carotid artery and the left internal carotid artery. - Right vertebral artery not imaged due to central line dressing. Left vertebral  artery not imaged due to patient position.  DVT - - No obvious evidence of deep vein or superficial thrombosis involving the right lower extremity and left lower extremity. - No evidence of Baker&'s cyst on the right or left.  EEG pending  A1C 5.8 and LDL 92   PHYSICAL EXAM Young obese Caucasian lady not in distress..Intubated and sedated. Afebrile. Head is nontraumatic. Neck is supple without bruit.    l. Cardiac exam soft ejection murmur RRR .no gallop. Lungs bilateral crackles and  rhonchi to auscultation. Distal pulses are well felt. Gen: intubated and sedated   Neurological Exam : intubated sedated on Fentanyl. Not following commands. Slight downgaze preference. Eyes conjugate. Pupils 3 mm reactive. Fundi not visualized. Left lower face weakness. On nipple pinch, she had b/l LE posturing but on pain stimulation of individual limbs, she has trace withdraw but L>R. Left Plantar upgoing and right down going.  ASSESSMENT/PLAN Lynn Gentry is a 43 y.o. female with no documented medical history found with new onset slurred speech and left-sided weakness. CT showed right external capsule hemorrhage. However, her condition getting worse with pulmonary edema, AKI requiring HD, respiratory failure needing ventilation. Transferred pt to CCM for better management.  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  2D echo unremarkable  Carotid doppler unremarkable  HgbA1c 5.8  SCDs for VTE prophylaxis  will likely need trach and PEG  Resultant VDRF, left hemiparesis  no antithrombotics prior to admission  Ongoing aggressive risk factor management  Therapy recommendations:  pending  Disposition:  pending  Acute Respiratory Failure with severe acute pulmonary edema  Intubated  Sedated  CCM following  Diuresing, IV Lasix, Hemodialysis, urine output minimal today  Malignant Hypertension  BP 226/111 at onset  Home meds:   none  SBP goal < 140 due to presence of end organ damage  On amlodipine, clonidine patch and metoprolol  try to wean off cardene   Acute renal failure with volume overload, suspect baseline chronic kidney disease  Diuresis and management per critical care, on HD  Continue to trend Cre.  HD yesterday  Other Stroke Risk Factors ETOH use   Morbid Obesity, Body mass index is 45.96 kg/(m^2).   Hospital day # 7 02/28/2014 12:20 PM  This patient is critically ill due to cerebral hemorrhage, respiratory failure, renal failure and at significant risk of neurological worsening, death form hematoma expansion, cerebral edema, brain herniation. This patient's care requires constant monitoring of vital signs, hemodynamics, respiratory and cardiac monitoring, review of multiple databases, neurological assessment, discussion with family, other specialists and medical decision making of high complexity. I spent 35 minutes of neurocritical care time in the care of this patient.   Marvel Plan, MD PhD Stroke Neurology 02/28/2014 12:20 PM   To contact Stroke Continuity provider, please refer to  [email or web address]. After hours, contact General Neurology

## 2014-02-28 NOTE — Progress Notes (Signed)
eLink Physician-Brief Progress Note Patient Name: Lynn Gentry DOB: 1970/09/23 MRN: [id number]   Date of Service  02/28/2014  HPI/Events of Note  On max cardene gtt  eICU Interventions  Increase po hydralazine 25 4/day Increase PO lopressor 50 bid     Intervention Category Intermediate Interventions: Hypertension - evaluation and management  [name] V. 02/28/2014, 11:41 PM

## 2014-02-28 NOTE — Progress Notes (Signed)
EEG Completed; Results Pending  

## 2014-02-28 NOTE — Progress Notes (Signed)
PULMONARY / CRITICAL CARE MEDICINE   Name: Lynn Gentry MRN: [id number] DOB: [birth year]    ADMISSION DATE:  02/21/2014 CONSULTATION DATE:  02/28/2014  REFERRING MD :  Pearlean Brownie  CHIEF COMPLAINT:  Respiratory failure in setting of ICH  INITIAL PRESENTATION: 43 y.o. F brought to [facility] ED on 10/6 with left sided weakness, slurred speech, inability to stand up.  In ED, found to have acute lateral right basal ganglia ICH with mild mass effect and 75mm MLS.  Admitted to neuro ICU and developed acute respiratory failure, failed bipap and PCCM called for intubation and management. She was admitted to neuro ICU and later that evening had increasing O2 demands.  She had no improvement in SpO2, work of breathing, and mental status despite 3 hours of BiPAP.  PCCM was consulted for intubation.  STUDIES:  10/06 CT head:  acute hemorrhage into the lateral right basal ganglia, likely indicating a hypertensive hemorrhage/hemorrhagic stroke.  Mild mass effect with 64mm MLS to the left. 10/07 Renal US:  echogenic kidneys s/w ckd, no hydro.  10/07 CT head: [facility] 10/07 repeat CT head: [facility] 10/07 Doppler carotid: consistent with 1-39% stenosis involving the R internal carotid art and the L internal carotid art 10/07 TTE: LVEF 60-65%. Severe concentric LVH. LA dilatation. PASP est 43 mmHg 10/09 CT head: [facility] 10/09 LE venous Dopplers (ordered by Stroke) >>   SIGNIFICANT EVENTS: 10/6 - admitted for ICH, developed respiratory failure requiring intubation. 10/07 Persistent hypertension. Remained on nicardipine gtt. Persistent volume overload snd pulm edema pattern on CXR. Lasix gtt resulted in minimal increase in Uo 10/08 Worsening renal function, oliguria. Renal consult. HD performed 10/09 Acute change in neuro status. STAT CT head ordered: [facility] 10/09 Further HD planned  VITAL SIGNS: Temp:  [98.7 F (37.1 C)-100.3 F (37.9 C)] 100.3 F (37.9 C) (10/13 0800) Pulse Rate:  [74-114] 85 (10/13 1015) Resp:  [17-36] 28  (10/13 1015) BP: (125-165)/(56-82) 130/63 mmHg (10/13 1000) SpO2:  [90 %-100 %] 97 % (10/13 1015) FiO2 (%):  [40 %-80 %] 40 % (10/13 0804) Weight:  [121.4 kg (267 lb 10.2 oz)-121.5 kg (267 lb 13.7 oz)] 121.5 kg (267 lb 13.7 oz) (10/13 0600)  HEMODYNAMICS: CVP:  [11 mmHg-19 mmHg] 18 mmHg  VENTILATOR SETTINGS: Vent Mode:  [-] PCV FiO2 (%):  [40 %-80 %] 40 % Set Rate:  [20 bmp] 20 bmp PEEP:  [8 cmH20-12 cmH20] 8 cmH20 Plateau Pressure:  [27 cmH20-29 cmH20] 27 cmH20  INTAKE / OUTPUT: Intake/Output     10/12 0701 - 10/13 0700 10/13 0701 - 10/14 0700   I.V. (mL/kg) 538.6 (4.4) 169.8 (1.4)   NG/GT 1510 280   IV Piggyback     Total Intake(mL/kg) 2048.6 (16.9) 449.8 (3.7)   Urine (mL/kg/hr) 115 (0) 15 (0)   Other 4051 (1.4)    Total Output 4166 15   Net -2117.4 +434.8        Stool Occurrence 1 x     PHYSICAL EXAMINATION: General: Comatose, not following commands Neuro: Unresponsive, localizes with RUE > LUE HEENT: [place]/AT. PERRL Cardiovascular: RRR, no M  Lungs: No wheezes Abdomen: Obese, soft, +BS Ext: symmetric BUE > BLE edema   LABS:  I have reviewed all of today's lab results. Relevant abnormalities are discussed in the A/P section  CXR: NSC CM, pulm edema pattern  ASSESSMENT / PLAN:  PULMONARY ETT 10/6 >>> A: Acute hypoxemic respiratory failure Acute pulmonary edema  - cardiogenic P:   Continue to PCV Needs aggressive volume off via  HD Cont vent bundle Daily SBT if/when meets criteria No extubation due to mental status Plan for tracheostomy on Thursday at noon Decrease PEEP to 5.  CARDIOVASCULAR CVL R IJ 10/6 >>  A:  HTN crisis  Severe pulmonary edema Acute systolic v diastolic chf Elevated troponin (pk 4.24 10/07) P:  Wean nicardipine to off maintaining SBP < 140 mmHg Continue lopressor 25 mg PO BID with holding parameters Continue norvasc 10 mg PO daily with holding parameters Continue Catapres 0.2 mg to skin Add PO hydralazine PRN hydralazine  ordered 10/09 PRN metoprolol ordered 10/09 Not a candidate for aggressive cardiac intervention presently - Cards not called yet  RENAL L IJ HD cath 10/08 >>  A:   Acute renal failure, oliguric Suspected CKD - likely hypertensive volume overload P:   Monitor BMET intermittently Monitor I/Os Correct electrolytes as indicated HD per renal  GASTROINTESTINAL A:   Obesity P:   SUP: Pantoprazole Cont TFs  HEMATOLOGIC A:   ICU acquired anemia P:  DVT px: SCDs Monitor CBC intermittently Transfuse per usual ICU guidelines  INFECTIOUS A:   No overt infection P:   Monitor off abx   ENDOCRINE A:   Hyperglycemia, controlled P:   CBG's q4hr SSI  NEUROLOGIC A:   Hypertensive R basal ganglion hemorrhage  Acute encephalopathy P:   Mgmt per Stroke team Sedation: fentanyl drip, PRN midaz RASS goal: -1 to -2 Daily WUA.  FAMILY: Mother updated bedside.  TODAY'S SUMMARY: PO hydralazine added, will plan for tracheostomy on Thursday at noon.  35 mins CCM time  *Care during the described time interval was provided by me and/or other providers on the critical care team. I have reviewed this patient's available data, including medical history, events of note, physical examination and test results as part of my evaluation.  Alyson ReedyWesam G. Gohan Collister, M.D. [facility]monary/Critical Care Medicine. Pager: [phone]. After hours pager: [phone].

## 2014-02-28 NOTE — Progress Notes (Signed)
NUTRITION FOLLOW-UP  DOCUMENTATION CODES Per approved criteria  -Morbid Obesity   INTERVENTION: Vital High Protein to 40 ml/hr via OG tube   60 ml Prostat BID.    MVI daily  Tube feeding regimen provides 1360 kcal (67% of needs), 144 grams of protein, and 802 ml of H2O.    NUTRITION DIAGNOSIS: Inadequate oral intake related to inability to eat as evidenced by NPO status; ongoing.   Goal: Enteral nutrition to provide 60-70% of estimated calorie needs (22-25 kcals/kg ideal body weight) and 100% of estimated protein needs, based on ASPEN guidelines for permissive underfeeding in critically ill obese individuals; met.   Monitor:  Respiratory status, HD, TF tolerance, labs   ASSESSMENT: Pt admitted 10/6 with left sided weakness, slurred speech, inability to stand up. In ED, found to have acute lateral right basal ganglia ICH with mild mass effect and 74mm MLS. Pt developed respiratory failure and was intubated.   Patient is currently intubated on ventilator support MV: 10 L/min Temp (24hrs), Avg:99.4 F (37.4 C), Min:98.7 F (37.1 C), Max:100.3 F (37.9 C)  Propofol: off  Pt had HD catheter placed 10/8 and started HD. Pt has had 4 HD treatments. Pt with volume overload which per Renal is improving with aggressive HD and UF. Lasix stopped.  Per MD note pt may need trach and therefore would require LTACH due to HD need.  Pt discussed during ICU rounds and with RN.  Phosphorus elevated.   Height: Ht Readings from Last 1 Encounters:  02/21/14 $RemoveB'5\' 4"'MLCoYzGt$  (1.626 m)    Weight: Wt Readings from Last 1 Encounters:  02/28/14 267 lb 13.7 oz (121.5 kg)  Admission weight: 276 lb (125.5 kg) 10/6  BMI:  Body mass index is 45.96 kg/(m^2).  Estimated Nutritional Needs: Kcal: 2171 Protein: >/= 136 grams Fluid: > 2 L/day  Skin: intact  Diet Order:     Intake/Output Summary (Last 24 hours) at 02/28/14 0955 Last data filed at 02/28/14 0900  Gross per 24 hour  Intake 1997.13 ml   Output   4181 ml  Net -2183.87 ml    Last BM: 10/13   Labs:   Recent Labs Lab 02/26/14 0400 02/27/14 0415 02/28/14 0350  NA 139 138 139  K 3.7 4.2 3.8  CL 102 98 97  CO2 $Re'20 20 21  'NYd$ BUN 59* 96* 83*  CREATININE 4.18* 3.93* 4.81*  CALCIUM 7.9* 9.1 9.2  MG 2.0 2.5 2.4  PHOS 5.0* 7.0* 5.4*  GLUCOSE 129* 129* 139*    CBG (last 3)   Recent Labs  02/28/14 0014 02/28/14 0332 02/28/14 0811  GLUCAP 146* 136* 154*    Scheduled Meds: . amLODipine  10 mg Oral Daily  . antiseptic oral rinse  7 mL Mouth Rinse QID  . chlorhexidine  15 mL Mouth Rinse BID  . cloNIDine  0.2 mg Transdermal Q Mon  . darbepoetin (ARANESP) injection - DIALYSIS  100 mcg Intravenous Q Mon-HD  . feeding supplement (PRO-STAT SUGAR FREE 64)  60 mL Per Tube BID  . feeding supplement (VITAL HIGH PROTEIN)  1,000 mL Per Tube Q24H  . insulin aspart  0-15 Units Subcutaneous 6 times per day  . metoprolol tartrate  25 mg Per Tube BID  . multivitamin  5 mL Per Tube Daily  . pantoprazole sodium  40 mg Per Tube Q1200  . senna-docusate  1 tablet Per Tube BID    Continuous Infusions: . fentaNYL infusion INTRAVENOUS Stopped (02/27/14 0845)  . niCARDipine 10 mg/hr (02/28/14 0933)  [place], [place], [place] Pager [phone] After Hours Pager

## 2014-02-28 NOTE — Progress Notes (Signed)
Wasted of Fentanyl in sink. [place] Allen, RN witnessed.

## 2014-03-01 ENCOUNTER — Inpatient Hospital Stay (HOSPITAL_COMMUNITY): Payer: 59

## 2014-03-01 ENCOUNTER — Telehealth: Payer: Self-pay | Admitting: *Deleted

## 2014-03-01 LAB — BLOOD GAS, ARTERIAL
Acid-base deficit: 1.8 mmol/L (ref 0.0–2.0)
Bicarbonate: 21.6 mEq/L (ref 20.0–24.0)
DRAWN BY: 36277
FIO2: 0.4 %
O2 SAT: 98.9 %
PEEP: 5 cmH2O
PO2 ART: 129 mmHg — AB (ref 80.0–100.0)
PRESSURE CONTROL: 20 cmH2O
Patient temperature: 98.6
RATE: 20 resp/min
TCO2: 22.6 mmol/L (ref 0–100)
pCO2 arterial: 31.4 mmHg — ABNORMAL LOW (ref 35.0–45.0)
pH, Arterial: 7.452 — ABNORMAL HIGH (ref 7.350–7.450)

## 2014-03-01 LAB — GLUCOSE, CAPILLARY
Glucose-Capillary: 133 mg/dL — ABNORMAL HIGH (ref 70–99)
Glucose-Capillary: 115 mg/dL — ABNORMAL HIGH (ref 70–99)
Glucose-Capillary: 155 mg/dL — ABNORMAL HIGH (ref 70–99)
Glucose-Capillary: 133 mg/dL — ABNORMAL HIGH (ref 70–99)
Glucose-Capillary: 144 mg/dL — ABNORMAL HIGH (ref 70–99)

## 2014-03-01 LAB — BASIC METABOLIC PANEL
Anion gap: 21 — ABNORMAL HIGH (ref 5–15)
BUN: 124 mg/dL — AB (ref 6–23)
CO2: 20 meq/L (ref 19–32)
CREATININE: 6.58 mg/dL — AB (ref 0.50–1.10)
Calcium: 9.2 mg/dL (ref 8.4–10.5)
Chloride: 98 mEq/L (ref 96–112)
GFR calc Af Amer: 8 mL/min — ABNORMAL LOW (ref >90)
GFR calc non Af Amer: 7 mL/min — ABNORMAL LOW (ref >90)
Glucose, Bld: 135 mg/dL — ABNORMAL HIGH (ref 70–99)
POTASSIUM: 3.6 meq/L — AB (ref 3.7–5.3)
Sodium: 139 mEq/L (ref 137–147)

## 2014-03-01 LAB — CBC
HEMATOCRIT: 24.7 % — AB (ref 36.0–46.0)
HEMOGLOBIN: 8 g/dL — AB (ref 12.0–15.0)
MCH: 27.8 pg (ref 26.0–34.0)
MCHC: 32.4 g/dL (ref 30.0–36.0)
MCV: 85.8 fL (ref 78.0–100.0)
Platelets: 268 10*3/uL (ref 150–400)
RBC: 2.88 MIL/uL — AB (ref 3.87–5.11)
RDW: 15.9 % — ABNORMAL HIGH (ref 11.5–15.5)
WBC: 13.3 10*3/uL — ABNORMAL HIGH (ref 4.0–10.5)

## 2014-03-01 LAB — PHOSPHORUS: Phosphorus: 5.6 mg/dL — ABNORMAL HIGH (ref 2.3–4.6)

## 2014-03-01 LAB — MAGNESIUM: Magnesium: 2.7 mg/dL — ABNORMAL HIGH (ref 1.5–2.5)

## 2014-03-01 MED ORDER — MIDAZOLAM HCL 2 MG/2ML IJ SOLN
4.0000 mg | Freq: Once | INTRAMUSCULAR | Status: AC
  Administered 2014-03-02: 4 mg via INTRAVENOUS
  Filled 2014-03-01: qty 4

## 2014-03-01 MED ORDER — CLONIDINE HCL 0.3 MG/24HR TD PTWK
0.3000 mg | MEDICATED_PATCH | TRANSDERMAL | Status: DC
  Administered 2014-03-06 – 2014-03-13 (×3): 0.3 mg via TRANSDERMAL
  Filled 2014-03-01 (×3): qty 1

## 2014-03-01 MED ORDER — VECURONIUM BROMIDE 10 MG IV SOLR
10.0000 mg | Freq: Once | INTRAVENOUS | Status: AC
  Administered 2014-03-02: 10 mg via INTRAVENOUS
  Filled 2014-03-01 (×2): qty 10

## 2014-03-01 MED ORDER — FENTANYL CITRATE 0.05 MG/ML IJ SOLN
200.0000 ug | Freq: Once | INTRAMUSCULAR | Status: AC
  Filled 2014-03-01: qty 4

## 2014-03-01 MED ORDER — HYDRALAZINE HCL 50 MG PO TABS
50.0000 mg | ORAL_TABLET | Freq: Four times a day (QID) | ORAL | Status: DC
  Administered 2014-03-01 – 2014-03-04 (×12): 50 mg via ORAL
  Filled 2014-03-01 (×16): qty 1

## 2014-03-01 MED ORDER — ETOMIDATE 2 MG/ML IV SOLN
40.0000 mg | Freq: Once | INTRAVENOUS | Status: AC
  Administered 2014-03-02: 20 mg via INTRAVENOUS
  Filled 2014-03-01 (×2): qty 20

## 2014-03-01 MED ORDER — PROPOFOL 10 MG/ML IV EMUL
5.0000 ug/kg/min | Freq: Once | INTRAVENOUS | Status: DC
  Filled 2014-03-01: qty 100

## 2014-03-01 NOTE — Progress Notes (Signed)
STROKE TEAM PROGRESS NOTE   HISTORY Lynn Gentry is an 43 y.o. female with no previously documented medical disorder who was brought to [facility] with new onset slurred speech and left-sided weakness. Patient went to work as usual until 1500. She was found by her son at home at 1600 unable to get up out of a chair with left sided weakness, slurred speech. Patient was last known well around 3 PM this afternoon 02/21/2014. CT scan of her head showed an acute 4.1 x 1.5 cm right basal ganglia hemorrhage with mild mass effect and right to left shift of 5 mm. Patient's blood pressure was markedly elevated at 226/111. She was started on Cardene drip and blood pressure responded well. She has shown continual respiratory difficulty. She was initially given oxygen by nasal cannula followed by NBR, and subsequently placed on BiPAP. She continued to have oxygen saturations only in the high 80s to low 90s on 100% O2, with a respiratory rates in the 40s. ABG showed pH of 2.82, PCO2 37 PO2 of 57. Chest x-ray showed mild cardiomegaly with possible asymmetric pulmonary edema. BNP was 36,455. Because of deteriorating respiratory status intubation and mechanical ventilation was elected. CCM service was consulted. Patient was not administered TPA secondary to ICH. She was admitted to the neuro ICU  for further evaluation and treatment.   SUBJECTIVE (INTERVAL HISTORY) Pt is getting dialysis again today. She has no acute neuro changes overnight. More responsive to pain stimulation today with more withdraw on the right. Still on nicardipene drip. Tachycardia. EEG showed severe slowing but no seizure, CT repeat showed stable right BG bleeding but left MCA territory hypodensity suggests ischemia chages.   OBJECTIVE Temp:  [98.8 F (37.1 C)-100.5 F (38.1 C)] 99.6 F (37.6 C) (10/14 1115) Pulse Rate:  [78-112] 86 (10/14 1300) Cardiac Rhythm:  [-] Sinus tachycardia;Normal sinus rhythm (10/14 1110) Resp:  [12-41]  26 (10/14 1300) BP: (116-184)/(54-95) 122/65 mmHg (10/14 1300) SpO2:  [88 %-100 %] 97 % (10/14 1300) FiO2 (%):  [30 %-40 %] 30 % (10/14 1200) Weight:  [258 lb 9.6 oz (117.3 kg)-278 lb 10.6 oz (126.4 kg)] 258 lb 9.6 oz (117.3 kg) (10/14 1115)   Recent Labs Lab 02/28/14 2016 02/28/14 2321 03/01/14 0318 03/01/14 0833 03/01/14 1142  GLUCAP 164* 166* 133* 115* 155*    Recent Labs Lab 02/23/14 0500  02/25/14 0433 02/26/14 0400 02/27/14 0415 02/28/14 0350 03/01/14 0500  NA  --   < > 139 139 138 139 139  K  --   < > 3.7 3.7 4.2 3.8 3.6*  CL  --   < > 99 102 98 97 98  CO2  --   < > 21 20 20 21 20   GLUCOSE  --   < > 135* 129* 129* 139* 135*  BUN  --   < > 69* 59* 96* 83* 124*  CREATININE  --   < > 5.72* 4.18* 3.93* 4.81* 6.58*  CALCIUM  --   < > 8.7 7.9* 9.1 9.2 9.2  MG 1.9  --   --  2.0 2.5 2.4 2.7*  PHOS 5.9*  --  6.3* 5.0* 7.0* 5.4* 5.6*  < > = values in this interval not displayed.  Recent Labs Lab 02/24/14 0450 02/25/14 0433 02/26/14 0400  AST 20  --   --   ALT 16  --   --   ALKPHOS 112  --   --   BILITOT 0.6  --   --  PROT 6.7  --   --   ALBUMIN 2.8* 2.8* 2.4*    Recent Labs Lab 02/25/14 0433 02/26/14 0400 02/27/14 0415 02/28/14 0350 03/01/14 0500  WBC 13.8* 14.6* 18.6* 16.7* 13.3*  HGB 8.4* 8.2* 8.6* 8.4* 8.0*  HCT 26.7* 25.1* 27.1* 26.4* 24.7*  MCV 84.8 85.1 86.0 84.6 85.8  PLT 218 235 273 249 268    Recent Labs Lab 02/22/14 1506  TROPONINI 4.24*   No results found for this basename: LABPROT, INR,  in the last 72 hours No results found for this basename: COLORURINE, APPERANCEUR, LABSPEC, PHURINE, GLUCOSEU, HGBUR, BILIRUBINUR, KETONESUR, PROTEINUR, UROBILINOGEN, NITRITE, LEUKOCYTESUR,  in the last 72 hours     Component Value Date/Time   CHOL 177 02/28/2014 0350   Lab Results  Component Value Date   HGBA1C 5.8* 02/23/2014   No results found for this basename: labopia,  cocainscrnur,  labbenz,  amphetmu,  thcu,  labbarb    No results found  for this basename: ETH,  in the last 168 hours  Ct Head Wo Contrast 02/28/14 - Stable 4.2 x 1.1 cm right lenticular nucleus hematoma with mild  surrounding vasogenic edema. Local mass effect upon the right  lateral ventricle with slight bowing of the septum to the left by  3.9 mm without significant change.  This may represent result of hypertensive hemorrhage and can be  assessed as this clears.  Prominent white matter type changes asymmetric greater on the left  more conspicuous on the present exam than on the prior exam. The  patient may benefit from followup MR and MR angiogram for further  delineation. This may be ischemic in origin possibly related to  watershed type infarct. Other causes not excluded although felt to  be less likely.  02/24/2014 Stable appearance of the hematoma within the right basal ganglia. No new or progressive findings.  02/22/2014   1. No significant interval change in the patient's intraparenchymal hemorrhage at the right lateral basal ganglia, measuring 4.1 x 1.2 cm, with mild surrounding vasogenic edema. Mild associated mass effect, with 4 mm of leftward midline shift again seen. 2. Scattered small vessel ischemic microangiopathy. 3. Bilateral proptosis noted.     02/21/2014   1. Acute hemorrhage into the lateral right basal ganglia, likely indicating a hypertensive hemorrhage/hemorrhagic stroke. 2. Mild mass effect with shift of the midline to the left approximating 5 mm. Critical   [place] Renal Port 02/22/2014    1. Small echogenic kidneys consistent with chronic renal medical disease. No hydronephrosis. 2. The urinary bladder is decompressed by Foley catheter and cannot be evaluated.     Dg Chest Port 1 View 02/22/2014    1. Satisfactory positioning of endotracheal tube and right IJ line. 2. Cardiomegaly with persistent diffuse interstitial and airspace disease likely reflecting edema and congestive heart failure. 3. Bilateral pleural effusions and basilar  airspace disease. This likely reflects atelectasis.    02/21/2014    Mild cardiac enlargement and possible asymmetric pulmonary edema. However, exam is limited by body habitus.     2D echo - - Left ventricle: The cavity size was normal. There was severe concentric hypertrophy. Systolic function was normal. The estimated ejection fraction was in the range of 60% to 65%. Wall motion was normal; there were no regional wall motion abnormalities. - Left atrium: The atrium was mildly dilated. - Pulmonary arteries: PA peak pressure: 43 mm Hg (S). - Pericardium, extracardiac: A small, free-flowing pericardial effusion was identified posterior to the heart and circumferential to  the heart. The fluid had no internal echoes.There was no evidence of hemodynamic compromise. Impressions: - The right ventricular systolic pressure was increased consistent with moderate pulmonary hypertension.  CUS - - Findings consistent with 1-39 percent stenosis involving the right internal carotid artery and the left internal carotid artery. - Right vertebral artery not imaged due to central line dressing. Left vertebral artery not imaged due to patient position.  DVT - - No obvious evidence of deep vein or superficial thrombosis involving the right lower extremity and left lower extremity. - No evidence of Baker&'s cyst on the right or left.  EEG pending  A1C 5.8 and LDL 92   PHYSICAL EXAM Young obese Caucasian lady not in distress..Intubated and sedated. Afebrile. Head is nontraumatic. Neck is supple without bruit.    l. Cardiac exam soft ejection murmur RRR .no gallop. Lungs bilateral crackles and  rhonchi to auscultation. Distal pulses are well felt. Gen: intubated and sedated   Neurological Exam: still intubate, eyes not open and not following commands. Slight downgaze preference. Eyes conjugate. Pupils 3 mm reactive. Fundi not visualized. Left lower face weakness, corneal and gag present. On pain  stimulation, she had bilateral trace withdraw to pain, more at RUE and RLE and LLE and less on LUE. Left Plantar upgoing and right down going.  ASSESSMENT/PLAN Ms. Lynn Gentry is a 43 y.o. female with no documented medical history found with new onset slurred speech and left-sided weakness. CT showed right external capsule hemorrhage. However, her condition getting worse with pulmonary edema, AKI requiring HD, respiratory failure needing ventilation. Transferred pt to CCM for better management.  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 Will do it after trach.  EEG showed severe slowing but no seizure.  2D echo unremarkable  Carotid doppler unremarkable  HgbA1c 5.8  SCDs for VTE prophylaxis  trach and PEG scheduled Friday  Resultant VDRF, left hemiparesis  no antithrombotics prior to admission  Ongoing aggressive risk factor management  Acute Respiratory Failure with severe acute pulmonary edema  Intubated  CCM following  Diuresing, IV Lasix, Hemodialysis, urine output minimal today  Trach on Friday  Malignant Hypertension  BP 226/111 at onset  Home meds:   none  SBP goal < 140 due to presence of end organ damage  On amlodipine, clonidine patch and metoprolol and hydralazine  try to wean off cardene   Acute renal failure with volume overload, suspect baseline chronic kidney disease  Diuresis and management per critical care, on HD  Continue to trend Cre.  HD today  Other Stroke Risk Factors ETOH use   Morbid Obesity, Body mass index is 44.37 kg/(m^2).   Hospital day #  8 03/01/2014 2:14 PM  This patient is critically ill due to cerebral hemorrhage, respiratory failure, renal failure and at significant risk of neurological worsening, death form hematoma expansion, cerebral edema, brain herniation. This patient's care requires constant monitoring of vital signs, hemodynamics, respiratory and cardiac monitoring, review of multiple databases, neurological assessment, discussion with family, other specialists and medical decision making of high complexity. I spent 40 minutes of neurocritical care time in the care of this patient.  Marvel Plan, MD PhD Stroke Neurology 03/01/2014 2:14 PM   To contact Stroke Continuity provider, please refer to [email or web address]. After hours, contact General Neurology

## 2014-03-01 NOTE — Progress Notes (Signed)
During AM rounds today, Dr. Molli Knock changed her SBP goal to 150 from 140.

## 2014-03-01 NOTE — Progress Notes (Signed)
PULMONARY / CRITICAL CARE MEDICINE   Name: Lynn Gentry MRN: [id number] DOB: [birth year]    ADMISSION DATE:  02/21/2014 CONSULTATION DATE:  03/01/2014  REFERRING MD :  Pearlean BrownieSethi  CHIEF COMPLAINT:  Respiratory failure in setting of ICH  INITIAL PRESENTATION: 43 [facility]. F [facility] with left sided weakness, slurred speech, inability to stand up.  In ED, found to have acute lateral right basal ganglia ICH with mild mass effect and 5mm MLS.  Admitted to neuro ICU and developed acute respiratory failure, failed bipap and PCCM called for intubation and management. She was admitted to neuro ICU and later that evening had increasing O2 demands.  She had no improvement in SpO2, work of breathing, and mental status despite 3 hours of BiPAP.  PCCM was consulted for intubation.  STUDIES:  10/06 CT head:  acute hemorrhage into the lateral right basal ganglia, likely indicating a hypertensive hemorrhage/hemorrhagic stroke.  Mild mass effect with 5mm MLS to the left. 10/07 Renal US:  echogenic kidneys s/w ckd, no hydro.  [facility] Doppler carotid: consistent with 1-39% stenosis involving the R internal carotid art and the L internal carotid art 10/07 TTE: LVEF 60-65%. Severe concentric LVH. LA dilatation. PASP est 43 mmHg [facility] LE venous Dopplers (ordered by Stroke) >>   SIGNIFICANT EVENTS: 10/6 - admitted for ICH, developed respiratory failure requiring intubation. 10/07 Persistent hypertension. Remained on nicardipine gtt. Persistent volume overload snd pulm edema pattern on CXR. Lasix gtt resulted in minimal increase in Uo 10/08 Worsening renal function, oliguria. Renal consult. HD performed 10/09 Acute change in neuro status. STAT [facility] Further HD planned 10/14 HD with negative fluid balance  VITAL SIGNS: Temp:  [99.5 F (37.5 C)-100.5 F (38.1 C)] 100.1 F (37.8 C) (10/13 2323) Pulse Rate:  [74-114]  86 (10/14 0600) Resp:  [12-41] 31 (10/14 0600) BP: (123-176)/(54-81) 136/61 mmHg (10/14 0600) SpO2:  [90 %-100 %] 98 % (10/14 0600) FiO2 (%):  [30 %-40 %] 30 % (10/14 0548) Weight:  [126.4 kg (278 lb 10.6 oz)] 126.4 kg (278 lb 10.6 oz) (10/14 0500)  HEMODYNAMICS: CVP:  [11 mmHg-25 mmHg] 14 mmHg  VENTILATOR SETTINGS: Vent Mode:  [-] PCV FiO2 (%):  [30 %-40 %] 30 % Set Rate:  [20 bmp-28 bmp] 28 bmp PEEP:  [5 cmH20-8 cmH20] 5 cmH20 Plateau Pressure:  [23 cmH20-27 cmH20] 23 cmH20  INTAKE / OUTPUT: Intake/Output     10/13 0701 - 10/14 0700 10/14 0701 - 10/15 0700   I.V. (mL/kg) 1215.4 (9.6)    NG/GT 980    Total Intake(mL/kg) 2195.4 (17.4)    Urine (mL/kg/hr) 128 (0)    Other     Total Output 128     Net +2067.4           PHYSICAL EXAMINATION: General: Comatose, not following commands Neuro: Unresponsive, localizes with RUE > LUE HEENT: [place]/AT. PERRL Cardiovascular: RRR, no M  Lungs: No wheezes Abdomen: Obese, soft, +BS Ext: symmetric BUE > BLE edema   LABS:  I have reviewed all of today's lab results. Relevant abnormalities are discussed in the A/P section  CXR: NSC CM, pulm edema pattern  ASSESSMENT / PLAN:  PULMONARY ETT 10/6 >>> A: Acute hypoxemic respiratory failure Acute pulmonary edema  - cardiogenic P:   Continue to PCV Needs aggressive volume off via HD Cont vent bundle Daily SBT if/when meets criteria No extubation due to  mental status Plan for tracheostomy on Thursday at noon  CARDIOVASCULAR CVL R IJ 10/6 >>  A:  HTN crisis  Severe pulmonary edema Acute systolic v diastolic chf Elevated troponin (pk 4.24 10/07) P:  Wean nicardipine to off maintaining SBP < 140 mmHg Increase lopressor 50 mg PO BID with holding parameters Continue norvasc 10 mg PO daily with holding parameters Increase Catapres to 0.3 mg to skin Increase hydralazine to 50 q6 If remains on cardene will likely change lopressor to labetalol for additional alpha effects PRN  hydralazine ordered 10/09 PRN metoprolol ordered 10/09 Not a candidate for aggressive cardiac intervention presently - Cards not called yet  RENAL L IJ HD cath 10/08 >>  A:   Acute renal failure, oliguric Suspected CKD - likely hypertensive volume overload P:   Monitor BMET intermittently Monitor I/Os Correct electrolytes as indicated HD per renal, -5L anticipated today  GASTROINTESTINAL A:   Obesity P:   SUP: Pantoprazole Cont TFs  HEMATOLOGIC A:   ICU acquired anemia P:  DVT px: SCDs Monitor CBC intermittently Transfuse per usual ICU guidelines  INFECTIOUS A:   No overt infection P:   Monitor off abx   ENDOCRINE A:   Hyperglycemia, controlled P:   CBG's q4hr SSI  NEUROLOGIC A:   Hypertensive R basal ganglion hemorrhage  Acute encephalopathy P:   Mgmt per Stroke team Sedation: fentanyl drip, PRN midaz RASS goal: -1 to -2 Daily WUA.  FAMILY: Mother updated bedside.  TODAY'S SUMMARY: Anti-HTN increased as above (remains on a cardene drip), plan trach tomorrow at noon, hold weaning for now, hopefully a lot of volume negative as above.  35 mins CCM time  *Care during the described time interval was provided by me and/or other providers on the critical care team. I have reviewed this patient's available data, including medical history, events of note, physical examination and test results as part of my evaluation.  Alyson Reedy, M.D. [facility] Pulmonary/Critical Care Medicine. Pager: [phone]. After hours pager: [phone].

## 2014-03-01 NOTE — Consult Note (Signed)
Reason for Consult:Placement of PEG Referring Physician: Shauna Bodkins is an 43 y.o. female.  HPI: Unfortunate 43 year old female with no knownhistory of hypertension, but came to [facility] with a hemorrhagic stroke, respiratory failure, uncontrollable hypertension and fluid overload.  Physiologically she has improved significantly, but not able to wean and is still being ventilated.  Getting tracheostomy by the critical care team tomorrow, long term care required a PEG  History reviewed. No pertinent past medical history.  History reviewed. No pertinent past surgical history.  No family history on file.  Social History:  reports that she has never smoked. She does not have any smokeless tobacco history on file. She reports that she drinks alcohol. She reports that she does not use illicit drugs.  Allergies: No Known Allergies  Medications: I have reviewed the patient's current medications.  Results for orders placed during the hospital encounter of 02/21/14 (from the past 48 hour(s))  GLUCOSE, CAPILLARY     Status: Abnormal   Collection Time    02/27/14  8:44 PM      Result Value Ref Range   Glucose-Capillary 131 (*) 70 - 99 mg/dL  GLUCOSE, CAPILLARY     Status: Abnormal   Collection Time    02/28/14 12:14 AM      Result Value Ref Range   Glucose-Capillary 146 (*) 70 - 99 mg/dL  BLOOD GAS, ARTERIAL     Status: Abnormal   Collection Time    02/28/14  3:15 AM      Result Value Ref Range   FIO2 0.40     Delivery systems VENTILATOR     Mode PRESSURE CONTROL     Rate 20     Peep/cpap 5.0     Pressure control 20     pH, Arterial 7.436  7.350 - 7.450   pCO2 arterial 35.6  35.0 - 45.0 mmHg   pO2, Arterial 130.0 (*) 80.0 - 100.0 mmHg   Bicarbonate 23.5  20.0 - 24.0 mEq/L   TCO2 24.6  0 - 100 mmol/L   Acid-base deficit 0.2  0.0 - 2.0 mmol/L   O2 Saturation 98.7     Patient temperature 98.6     Collection site A-LINE     Drawn by [phone]     Sample type  ARTERIAL DRAW     Allens test (pass/fail) PASS  PASS  GLUCOSE, CAPILLARY     Status: Abnormal   Collection Time    02/28/14  3:32 AM      Result Value Ref Range   Glucose-Capillary 136 (*) 70 - 99 mg/dL  CBC     Status: Abnormal   Collection Time    02/28/14  3:50 AM      Result Value Ref Range   WBC 16.7 (*) 4.0 - 10.5 K/uL   RBC 3.12 (*) 3.87 - 5.11 MIL/uL   Hemoglobin 8.4 (*) 12.0 - 15.0 g/dL   HCT 26.4 (*) 36.0 - 46.0 %   MCV 84.6  78.0 - 100.0 fL   MCH 26.9  26.0 - 34.0 pg   MCHC 31.8  30.0 - 36.0 g/dL   RDW 15.4  11.5 - 15.5 %   Platelets 249  150 - 400 K/uL  BASIC METABOLIC PANEL     Status: Abnormal   Collection Time    02/28/14  3:50 AM      Result Value Ref Range   Sodium 139  137 - 147 mEq/L   Potassium 3.8  3.7 - 5.3 mEq/L   Chloride 97  96 - 112 mEq/L   CO2 21  19 - 32 mEq/L   Glucose, Bld 139 (*) 70 - 99 mg/dL   BUN 83 (*) 6 - 23 mg/dL   Creatinine, Ser 4.81 (*) 0.50 - 1.10 mg/dL   Calcium 9.2  8.4 - 10.5 mg/dL   GFR calc non Af Amer 10 (*) >90 mL/min   GFR calc Af Amer 12 (*) >90 mL/min   Comment: (NOTE)     The eGFR has been calculated using the CKD EPI equation.     This calculation has not been validated in all clinical situations.     eGFR's persistently <90 mL/min signify possible Chronic Kidney     Disease.   Anion gap 21 (*) 5 - 15   Comment: RESULT CHECKED  MAGNESIUM     Status: None   Collection Time    02/28/14  3:50 AM      Result Value Ref Range   Magnesium 2.4  1.5 - 2.5 mg/dL  PHOSPHORUS     Status: Abnormal   Collection Time    02/28/14  3:50 AM      Result Value Ref Range   Phosphorus 5.4 (*) 2.3 - 4.6 mg/dL  LIPID PANEL     Status: None   Collection Time    02/28/14  3:50 AM      Result Value Ref Range   Cholesterol 177  0 - 200 mg/dL   Triglycerides 51  <150 mg/dL   HDL 75  >39 mg/dL   Total CHOL/HDL Ratio 2.4     VLDL 10  0 - 40 mg/dL   LDL Cholesterol 92  0 - 99 mg/dL   Comment:            Total Cholesterol/HDL:CHD Risk       Coronary Heart Disease Risk Table                         Men   Women      1/2 Average Risk   3.4   3.3      Average Risk       5.0   4.4      2 X Average Risk   9.6   7.1      3 X Average Risk  23.4   11.0                Use the calculated Patient Ratio     above and the CHD Risk Table     to determine the patient's CHD Risk.                ATP III CLASSIFICATION (LDL):      <100     mg/dL   Optimal      100-129  mg/dL   Near or Above                        Optimal      130-159  mg/dL   Borderline      160-189  mg/dL   High      >190     mg/dL   Very High  GLUCOSE, CAPILLARY     Status: Abnormal   Collection Time    02/28/14  8:11 AM      Result Value Ref Range   Glucose-Capillary 154 (*) 70 - 99 mg/dL  Comment 1 Notify RN     Comment 2 Documented in Chart    GLUCOSE, CAPILLARY     Status: Abnormal   Collection Time    02/28/14 11:55 AM      Result Value Ref Range   Glucose-Capillary 142 (*) 70 - 99 mg/dL   Comment 1 Notify RN     Comment 2 Documented in Chart    GLUCOSE, CAPILLARY     Status: Abnormal   Collection Time    02/28/14  4:18 PM      Result Value Ref Range   Glucose-Capillary 124 (*) 70 - 99 mg/dL   Comment 1 Notify RN     Comment 2 Documented in Chart    GLUCOSE, CAPILLARY     Status: Abnormal   Collection Time    02/28/14  8:16 PM      Result Value Ref Range   Glucose-Capillary 164 (*) 70 - 99 mg/dL  GLUCOSE, CAPILLARY     Status: Abnormal   Collection Time    02/28/14 11:21 PM      Result Value Ref Range   Glucose-Capillary 166 (*) 70 - 99 mg/dL   Comment 1 Documented in Chart     Comment 2 Notify RN    GLUCOSE, CAPILLARY     Status: Abnormal   Collection Time    03/01/14  3:18 AM      Result Value Ref Range   Glucose-Capillary 133 (*) 70 - 99 mg/dL   Comment 1 Documented in Chart     Comment 2 Notify RN    BLOOD GAS, ARTERIAL     Status: Abnormal   Collection Time    03/01/14  3:26 AM      Result Value Ref Range   FIO2 0.40      Delivery systems VENTILATOR     Mode PRESSURE CONTROL     Rate 20     Peep/cpap 5.0     Pressure control 20     pH, Arterial 7.452 (*) 7.350 - 7.450   pCO2 arterial 31.4 (*) 35.0 - 45.0 mmHg   pO2, Arterial 129.0 (*) 80.0 - 100.0 mmHg   Bicarbonate 21.6  20.0 - 24.0 mEq/L   TCO2 22.6  0 - 100 mmol/L   Acid-base deficit 1.8  0.0 - 2.0 mmol/L   O2 Saturation 98.9     Patient temperature 98.6     Collection site A-LINE     Drawn by [phone]     Sample type ARTERIAL     Allens test (pass/fail) NOT INDICATED (*) PASS  MAGNESIUM     Status: Abnormal   Collection Time    03/01/14  5:00 AM      Result Value Ref Range   Magnesium 2.7 (*) 1.5 - 2.5 mg/dL  PHOSPHORUS     Status: Abnormal   Collection Time    03/01/14  5:00 AM      Result Value Ref Range   Phosphorus 5.6 (*) 2.3 - 4.6 mg/dL  BASIC METABOLIC PANEL     Status: Abnormal   Collection Time    03/01/14  5:00 AM      Result Value Ref Range   Sodium 139  137 - 147 mEq/L   Potassium 3.6 (*) 3.7 - 5.3 mEq/L   Chloride 98  96 - 112 mEq/L   CO2 20  19 - 32 mEq/L   Glucose, Bld 135 (*) 70 - 99 mg/dL   BUN 124 (*) 6 - 23  mg/dL   Creatinine, Ser 6.58 (*) 0.50 - 1.10 mg/dL   Calcium 9.2  8.4 - 10.5 mg/dL   GFR calc non Af Amer 7 (*) >90 mL/min   GFR calc Af Amer 8 (*) >90 mL/min   Comment: (NOTE)     The eGFR has been calculated using the CKD EPI equation.     This calculation has not been validated in all clinical situations.     eGFR's persistently <90 mL/min signify possible Chronic Kidney     Disease.   Anion gap 21 (*) 5 - 15  CBC     Status: Abnormal   Collection Time    03/01/14  5:00 AM      Result Value Ref Range   WBC 13.3 (*) 4.0 - 10.5 K/uL   RBC 2.88 (*) 3.87 - 5.11 MIL/uL   Hemoglobin 8.0 (*) 12.0 - 15.0 g/dL   HCT 24.7 (*) 36.0 - 46.0 %   MCV 85.8  78.0 - 100.0 fL   MCH 27.8  26.0 - 34.0 pg   MCHC 32.4  30.0 - 36.0 g/dL   RDW 15.9 (*) 11.5 - 15.5 %   Platelets 268  150 - 400 K/uL  GLUCOSE, CAPILLARY      Status: Abnormal   Collection Time    03/01/14  8:33 AM      Result Value Ref Range   Glucose-Capillary 115 (*) 70 - 99 mg/dL   Comment 1 Notify RN     Comment 2 Documented in Chart    GLUCOSE, CAPILLARY     Status: Abnormal   Collection Time    03/01/14 11:42 AM      Result Value Ref Range   Glucose-Capillary 155 (*) 70 - 99 mg/dL   Comment 1 Notify RN     Comment 2 Documented in Chart    GLUCOSE, CAPILLARY     Status: Abnormal   Collection Time    03/01/14  4:11 PM      Result Value Ref Range   Glucose-Capillary 133 (*) 70 - 99 mg/dL    Ct Head Wo Contrast  02/28/2014   CLINICAL DATA:  43 year old hypertensive female with intracranial hemorrhage. Follow-up. Subsequent encounter.  EXAM: CT HEAD WITHOUT CONTRAST  TECHNIQUE: Contiguous axial images were obtained from the base of the skull through the vertex without intravenous contrast.  COMPARISON:  Four prior exams most recent 02/24/2014.  FINDINGS: Stable 4.2 x 1.1 cm right lenticular nucleus hematoma with mild surrounding vasogenic edema. Local mass effect upon the right lateral ventricle with slight bowing of the septum to the left by 3.9 mm without significant change.  This may represent result of hypertensive hemorrhage and can be assessed as this clears.  Prominent white matter type changes asymmetric greater on the left more conspicuous on the present exam than on the prior exam. The patient may benefit from followup MR and MR angiogram for further delineation. This may be ischemic in origin possibly related to watershed type infarct. Other causes not excluded although felt to be less likely.  Exophthalmos.  IMPRESSION: Stable 4.2 x 1.1 cm right lenticular nucleus hematoma with mild surrounding vasogenic edema. Local mass effect upon the right lateral ventricle with slight bowing of the septum to the left by 3.9 mm without significant change.  This may represent result of hypertensive hemorrhage and can be assessed as this clears.   Prominent white matter type changes asymmetric greater on the left more conspicuous on the present exam than on  the prior exam. The patient may benefit from followup MR and MR angiogram for further delineation. This may be ischemic in origin possibly related to watershed type infarct. Other causes not excluded although felt to be less likely.  These results will be called to the ordering clinician or representative by the Radiologist Assistant, and communication documented in the PACS or zVision Dashboard.   Electronically Signed   By: Chauncey Cruel M.D.   On: 02/28/2014 12:57   Dg Chest Port 1 View  03/01/2014   CLINICAL DATA:  Evaluate endotracheal tube position  EXAM: PORTABLE CHEST - 1 VIEW  COMPARISON:  Portable chest x-ray of 02/28/2014  FINDINGS: The tip of the endotracheal tube is approximately 4.0 cm above the carina. There is little change in suboptimal aeration with mild basilar volume loss. Cardiomegaly is unchanged, and there is some pulmonary vascular congestion present. Central venous lines are unchanged in position.  IMPRESSION: Endotracheal tube and central venous lines unchanged in position. Little change in poor aeration with perhaps mild pulmonary vascular congestion.   Electronically Signed   By: Ivar Drape M.D.   On: 03/01/2014 08:01   Dg Chest Port 1 View  02/28/2014   CLINICAL DATA:  43 year old female with acute intracranial hemorrhage. Intubated. Respiratory failure. Initial encounter.  EXAM: PORTABLE CHEST - 1 VIEW  COMPARISON:  02/27/2014 and earlier.  FINDINGS: Portable AP semi upright view at 0532 hrs.  Endotracheal tube tip now between the level the clavicles and carina. Stable left IJ approach dual lumen catheter. Stable right IJ single lumen catheter. Enteric tube courses to the abdomen.  Large body habitus and continued low lung volumes. Stable cardiac size and mediastinal contours. Improved ventilation since 02/21/2014. No pneumothorax, large pleural effusion or  consolidation. Perihilar atelectasis suspected.  IMPRESSION: 1. Endotracheal tube in good position. Otherwise, stable lines and tubes. 2. Large body habitus and low lung volumes with atelectasis.   Electronically Signed   By: Lars Pinks M.D.   On: 02/28/2014 07:26    Review of Systems  Unable to perform ROS: medical condition   Blood pressure 141/63, pulse 101, temperature 99.6 F (37.6 C), temperature source Axillary, resp. rate 31, height _0  (1.626 m), weight 117.3 kg (258 lb 9.6 oz), SpO2 97.00%. Physical Exam  GI: Soft. Bowel sounds are normal.    Morbidly obese, but tolerating tube feedings well.    Assessment/Plan: Morbidly obese female,, young, with difficult to control hypertension and ventilator dependence Getting tracheotomy tomorrow and will need PEG. Will plan on trying to get scheduled tomorrow, but endo lab closed.  Will make NPO after MN except for meds. Get consent signed. Braycen Burandt, JAY 03/01/2014, 5:42 PM

## 2014-03-01 NOTE — Procedures (Signed)
Patient was seen on dialysis and the procedure was supervised. BFR 400 Via L IJ HD Cath BP is 138/64.  Patient appears to be tolerating treatment well with 5 L UF.

## 2014-03-01 NOTE — Telephone Encounter (Signed)
Form,FMLA [facility] to [employer] 03-01-14.

## 2014-03-01 NOTE — Telephone Encounter (Signed)
I received Forms from Medical Records (FMLA from Hospital admission) and I have placed them in the Providers file and have placed them on his assistants In-Box to be completed.

## 2014-03-01 NOTE — Telephone Encounter (Signed)
Currently this pt is admitted in neuro ICU under critical care service. They are the primary service for this patient. Should the hospital admission send the forms to them to fill out? Thanks.  Marvel Plan, MD PhD Stroke Neurology 03/01/2014 7:05 PM

## 2014-03-02 ENCOUNTER — Inpatient Hospital Stay (HOSPITAL_COMMUNITY): Payer: 59

## 2014-03-02 ENCOUNTER — Encounter (HOSPITAL_COMMUNITY)
Admission: EM | Disposition: A | Discharge status: Nursing Home (Includes ICF) | Payer: Self-pay | Source: Home / Self Care | Attending: Pulmonary Disease

## 2014-03-02 DIAGNOSIS — R400 Somnolence: Secondary | ICD-10-CM

## 2014-03-02 HISTORY — PX: PEG PLACEMENT: SHX5437

## 2014-03-02 LAB — CBC
HEMATOCRIT: 26.1 % — AB (ref 36.0–46.0)
Hemoglobin: 8.3 g/dL — ABNORMAL LOW (ref 12.0–15.0)
MCH: 27.7 pg (ref 26.0–34.0)
MCHC: 31.8 g/dL (ref 30.0–36.0)
MCV: 87 fL (ref 78.0–100.0)
Platelets: 293 10*3/uL (ref 150–400)
RBC: 3 MIL/uL — ABNORMAL LOW (ref 3.87–5.11)
RDW: 16.1 % — ABNORMAL HIGH (ref 11.5–15.5)
WBC: 14.2 10*3/uL — ABNORMAL HIGH (ref 4.0–10.5)

## 2014-03-02 LAB — BLOOD GAS, ARTERIAL
ACID-BASE EXCESS: 1.7 mmol/L (ref 0.0–2.0)
Bicarbonate: 24.8 mEq/L — ABNORMAL HIGH (ref 20.0–24.0)
DRAWN BY: 405301
FIO2: 0.3 %
LHR: 20 {breaths}/min
O2 SAT: 97.7 %
PATIENT TEMPERATURE: 98.6
PEEP/CPAP: 5 cmH2O
PRESSURE CONTROL: 20 cmH2O
TCO2: 25.8 mmol/L (ref 0–100)
pCO2 arterial: 32.8 mmHg — ABNORMAL LOW (ref 35.0–45.0)
pH, Arterial: 7.491 — ABNORMAL HIGH (ref 7.350–7.450)
pO2, Arterial: 93 mmHg (ref 80.0–100.0)

## 2014-03-02 LAB — GLUCOSE, CAPILLARY
GLUCOSE-CAPILLARY: 135 mg/dL — AB (ref 70–99)
GLUCOSE-CAPILLARY: 159 mg/dL — AB (ref 70–99)
GLUCOSE-CAPILLARY: 128 mg/dL — AB (ref 70–99)
Glucose-Capillary: 125 mg/dL — ABNORMAL HIGH (ref 70–99)
Glucose-Capillary: 140 mg/dL — ABNORMAL HIGH (ref 70–99)
Glucose-Capillary: 91 mg/dL (ref 70–99)

## 2014-03-02 LAB — BASIC METABOLIC PANEL
Anion gap: 18 — ABNORMAL HIGH (ref 5–15)
BUN: 77 mg/dL — AB (ref 6–23)
CHLORIDE: 100 meq/L (ref 96–112)
CO2: 23 meq/L (ref 19–32)
Calcium: 8.9 mg/dL (ref 8.4–10.5)
Creatinine, Ser: 4.63 mg/dL — ABNORMAL HIGH (ref 0.50–1.10)
GFR calc Af Amer: 12 mL/min — ABNORMAL LOW (ref >90)
GFR calc non Af Amer: 11 mL/min — ABNORMAL LOW (ref >90)
Glucose, Bld: 128 mg/dL — ABNORMAL HIGH (ref 70–99)
Potassium: 4.2 mEq/L (ref 3.7–5.3)
Sodium: 141 mEq/L (ref 137–147)

## 2014-03-02 LAB — MAGNESIUM: Magnesium: 2.3 mg/dL (ref 1.5–2.5)

## 2014-03-02 LAB — PHOSPHORUS: Phosphorus: 3.8 mg/dL (ref 2.3–4.6)

## 2014-03-02 LAB — PROTIME-INR
INR: 1.45 (ref 0.00–1.49)
PROTHROMBIN TIME: 17.8 s — AB (ref 11.6–15.2)

## 2014-03-02 LAB — APTT: APTT: 27 s (ref 24–37)

## 2014-03-02 SURGERY — INSERTION, PEG TUBE

## 2014-03-02 MED ORDER — MIDAZOLAM HCL 2 MG/2ML IJ SOLN
INTRAMUSCULAR | Status: AC
  Filled 2014-03-02: qty 2

## 2014-03-02 MED ORDER — MIDAZOLAM HCL 2 MG/2ML IJ SOLN
2.0000 mg | Freq: Once | INTRAMUSCULAR | Status: AC
  Administered 2014-03-02: 2 mg via INTRAVENOUS

## 2014-03-02 MED ORDER — VECURONIUM BROMIDE 10 MG IV SOLR
INTRAVENOUS | Status: AC
  Administered 2014-03-02: 10 mg
  Filled 2014-03-02: qty 10

## 2014-03-02 MED ORDER — HEPARIN SODIUM (PORCINE) 5000 UNIT/ML IJ SOLN
5000.0000 [IU] | Freq: Three times a day (TID) | INTRAMUSCULAR | Status: DC
  Administered 2014-03-02 – 2014-03-04 (×5): 5000 [IU] via SUBCUTANEOUS
  Filled 2014-03-02 (×7): qty 1

## 2014-03-02 MED ORDER — FENTANYL CITRATE 0.05 MG/ML IJ SOLN
200.0000 ug | Freq: Once | INTRAMUSCULAR | Status: AC
  Administered 2014-03-02: 200 ug via INTRAVENOUS

## 2014-03-02 MED ORDER — FLUCONAZOLE IN SODIUM CHLORIDE 200-0.9 MG/100ML-% IV SOLN
200.0000 mg | INTRAVENOUS | Status: DC
  Administered 2014-03-02 – 2014-03-03 (×2): 200 mg via INTRAVENOUS
  Filled 2014-03-02 (×3): qty 100

## 2014-03-02 SURGICAL SUPPLY — 1 items: EndoVive Safety PEG Kit ×3 IMPLANT

## 2014-03-02 NOTE — Op Note (Signed)
03/02/2014  11:24 AM  PATIENT:  Lynn Gentry  43 y.o. female  PRE-OPERATIVE DIAGNOSIS:  stroke, dysphagia  POST-OPERATIVE DIAGNOSIS:  stroke, dysphagia  PROCEDURE:  Procedure(s): PERCUTANEOUS ENDOSCOPIC GASTROSTOMY (PEG) PLACEMENT  SURGEON:  Surgeon(s): Violeta Gelinas, MD  ASSISTANTS: Charma Igo, [facility]   ANESTHESIA:   local and IV sedation  EBL:  Total I/O In: 184.4 [I.V.:84.4; NG/GT:100] Out: 5 [Urine:5]  BLOOD ADMINISTERED:none  DRAINS: none   SPECIMEN:  No Specimen  DISPOSITION OF SPECIMEN:  N/A  COUNTS:  YES  DICTATION: .Dragon Dictation patient is ventilator dependent status post CVA. We are proceeding with PEG tube placement. Consent was obtained yesterday from her family. She was identified in the neurotrauma intensive care unit. We did a time out procedure. She received intravenous muscle relaxation, pain medication, and sedative. EGD scope was inserted via the mouth down into the esophagus. There were no gross lesions. The stomach was entered and insufflated. There were no ulcers or other lesions. The first portion of duodenum was entered and no ulcers were seen and no obstruction was seen. Scope was withdrawn to the stomach it was further insufflated. Excellent poke site was selected. Abdomen was prepped and draped in sterile fashion. Small incision was made after injecting local. Angiocath was inserted into the stomach under direct vision. Guidewire was inserted and grasped within the snare. Guidewire was brought through the mouth and the PEG tube was attached to it in standard fashion. PEG tube was brought out through the abdominal wall.. Scope was reinserted and PEG tube positioning was confirmed. We took a picture.The flange was applied and triple antibiotic on it was placed at the site. Dressing was placed. Stomach was evacuated. And the scope was removed. She tolerated this well except for brief decrease in saturation level to high 80s which responded to  increased FiO2 and there were no complications.  PATIENT DISPOSITION:  ICU - intubated and hemodynamically stable.   Delay start of Pharmacological VTE agent (>24hrs) due to surgical blood loss or risk of bleeding:  no  Violeta Gelinas, MD, MPH, FACS Pager: [phone]  10/15/201511:24 AM

## 2014-03-02 NOTE — Progress Notes (Signed)
Patient ID: Lynn Gentry, female   DOB: 08-Apr-1971, 43 y.o.   MRN: [id number]  [facility] Progress Note    Subjective:   Intubated/sedated   Objective:   BP 145/77  Pulse 102  Temp(Src) 101 F (38.3 C) (Axillary)  Resp 21  Ht 5\' 4"  (1.626 m)  Wt 113.7 kg (250 lb 10.6 oz)  BMI 43.01 kg/m2  SpO2 100%  Intake/Output: I/O last 3 completed shifts: In: 3183.8 [I.V.:1833.8; NG/GT:1350] Out: 4905 [Urine:105; Other:4800]   Intake/Output this shift:  Total I/O In: 184.4 [I.V.:84.4; NG/GT:100] Out: 5 [Urine:5] Weight change: -5 kg (-11 lb 0.4 oz)  Physical Exam: BMW:UXLKGGen:obese AAF intubated MWN:UUVOZCVS:tachy Resp:occ rhonchi DGU:YQIHKAbd:obese Ext:+edema  Labs: BMET  Recent Labs Lab 02/24/14 0450 02/25/14 0433 02/26/14 0400 02/27/14 0415 02/28/14 0350 03/01/14 0500 03/02/14 0500  NA 140 139 139 138 139 139 141  K 4.0 3.7 3.7 4.2 3.8 3.6* 4.2  CL 102 99 102 98 97 98 100  CO2 20 21 20 20 21 20 23   GLUCOSE 137* 135* 129* 129* 139* 135* 128*  BUN 67* 69* 59* 96* 83* 124* 77*  CREATININE 5.84* 5.72* 4.18* 3.93* 4.81* 6.58* 4.63*  ALBUMIN 2.8* 2.8* 2.4*  --   --   --   --   CALCIUM 8.4 8.7 7.9* 9.1 9.2 9.2 8.9  PHOS  --  6.3* 5.0* 7.0* 5.4* 5.6* 3.8   CBC  Recent Labs Lab 02/27/14 0415 02/28/14 0350 03/01/14 0500 03/02/14 0500  WBC 18.6* 16.7* 13.3* 14.2*  HGB 8.6* 8.4* 8.0* 8.3*  HCT 27.1* 26.4* 24.7* 26.1*  MCV 86.0 84.6 85.8 87.0  PLT 273 249 268 293    @IMGRELPRIORS @ Medications:    . amLODipine  10 mg Oral Daily  . antiseptic oral rinse  7 mL Mouth Rinse QID  . chlorhexidine  15 mL Mouth Rinse BID  . [START ON 03/06/2014] cloNIDine  0.3 mg Transdermal Q Mon  . darbepoetin (ARANESP) injection - DIALYSIS  100 mcg Intravenous Q Mon-HD  . etomidate  40 mg Intravenous Once  . feeding supplement (PRO-STAT SUGAR FREE 64)  60 mL Per Tube BID  . feeding supplement (VITAL HIGH PROTEIN)  1,000 mL Per Tube Q24H  . fentaNYL  200 mcg Intravenous Once  .  hydrALAZINE  50 mg Oral 4 times per day  . insulin aspart  0-15 Units Subcutaneous 6 times per day  . metoprolol tartrate  50 mg Per Tube BID  . multivitamin  5 mL Per Tube Daily  . pantoprazole sodium  40 mg Per Tube Q1200  . propofol  5-80 mcg/kg/min Intravenous Once  . senna-docusate  1 tablet Per Tube BID     Assessment/Plan:  1. AKI vs. Subacute vs. AKI/CKD- oliguric requiring dialysis. S/p 4 HD treatments ( has UF'd 14.5L thus far)  1. Plan for HD tomorrow for more UP  2. Remains oliguric and dialysis-dependent 3. Will need tunneled HD cath  2. R BG hemorrhagic stroke- neurosurgery following  1. Await repeat CT scan 3. VDRF- per PCCM 4. Malignant HTN and volume overload- improving with aggressive HD and UF.  1. stopped lasix and she remains oliguric and remains dialysis-dependent.  2. bp much better controlled. 3. Wean cardene gtt as able 5. Metabolic acidosis- improved with HD 6. ABLA- transfuse prn per PCCM 7. DM- per primary svc 8. Obesity- 9. Protein malnutrition- for PEG today 10. dispo- unclear prognosis, hopefully she will start to exhibit some neurologic improvement as well as renal. Cont with  supportive measures for now, however if she requires a trach, her only option for dialysis would be LTC facility. 11.   Lynn Gentry A 03/02/2014, 11:30 AM

## 2014-03-02 NOTE — Procedures (Signed)
Bedside Tracheostomy Insertion Procedure Note   Patient Details:   Name: Lynn Gentry DOB: 1970-09-30 MRN: [id number]  Procedure: Tracheostomy  Pre Procedure Assessment: ET Tube Size:7.5 ET Tube secured at lip (cm):20 Bite block in place: Yes Breath Sounds: Diminished  Post Procedure Assessment: BP 122/81  Pulse 92  Temp(Src) 99.7 F (37.6 C) (Axillary)  Resp 20  Ht 5\' 4"  (1.626 m)  Wt 250 lb 10.6 oz (113.7 kg)  BMI 43.01 kg/m2  SpO2 93% O2 sats: stable throughout Complications: No apparent complications Patient did tolerate procedure well Tracheostomy Brand:Shiley Tracheostomy Style:Cuffed Tracheostomy Size: 6.0 Tracheostomy Secured IHK:[id number] Tracheostomy Placement Confirmation:Trach cuff visualized and in place and Chest X ray ordered for placement    Leonard Downing 03/02/2014, 2:33 PM

## 2014-03-02 NOTE — Progress Notes (Signed)
PULMONARY / CRITICAL CARE MEDICINE   Name: Lynn NationsStephanie Pha MRN: [id number] DOB: [birth year]    ADMISSION DATE:  02/21/2014 CONSULTATION DATE:  03/02/2014  REFERRING MD :  Pearlean BrownieSethi  CHIEF COMPLAINT:  Respiratory failure in setting of ICH  INITIAL PRESENTATION: 43 y.o. F [facility] with left sided weakness, slurred speech, inability to stand up.  In ED, found to have acute lateral right basal ganglia ICH with mild mass effect and 5mm MLS.  Admitted to neuro ICU and developed acute respiratory failure, failed bipap and PCCM called for intubation and management. She was admitted to neuro ICU and later that evening had increasing O2 demands.  She had no improvement in SpO2, work of breathing, and mental status despite 3 hours of BiPAP.  PCCM was consulted for intubation.  STUDIES:  10/06 CT head:  acute hemorrhage into the lateral right basal ganglia, likely indicating a hypertensive hemorrhage/hemorrhagic stroke.  Mild mass effect with 5mm MLS to the left. 10/07 Renal US:  echogenic kidneys s/w ckd, no hydro.  [facility]07 Doppler carotid: consistent with 1-39% stenosis involving the R internal carotid art and the L internal carotid art 10/07 TTE: LVEF 60-65%. Severe concentric LVH. LA dilatation. PASP est 43 mmHg 10[facility] LE venous Dopplers (ordered by Stroke) >>   SIGNIFICANT EVENTS: 10/6 - admitted for ICH, developed respiratory failure requiring intubation. 10/07 Persistent hypertension. Remained on nicardipine gtt. Persistent volume overload snd pulm edema pattern on CXR. Lasix gtt resulted in minimal increase in Uo 10/08 Worsening renal function, oliguria. Renal consult. HD performed 10/09 Acute change in neuro status. STAT CT [facility] Further HD planned 10/14 HD with negative fluid balance  VITAL SIGNS: Temp:  [99.6 F (37.6 C)-101 F (38.3 C)] 101 F (38.3 C) (10/15 0815) Pulse Rate:  [83-111] 102  (10/15 0930) Resp:  [18-38] 21 (10/15 0930) BP: (116-178)/(54-104) 145/77 mmHg (10/15 0930) SpO2:  [88 %-100 %] 100 % (10/15 0930) FiO2 (%):  [30 %] 30 % (10/15 0810) Weight:  [113.7 kg (250 lb 10.6 oz)-117.3 kg (258 lb 9.6 oz)] 113.7 kg (250 lb 10.6 oz) (10/15 0600)  HEMODYNAMICS: CVP:  [10 mmHg-18 mmHg] 11 mmHg  VENTILATOR SETTINGS: Vent Mode:  [-] PCV FiO2 (%):  [30 %] 30 % Set Rate:  [20 bmp] 20 bmp PEEP:  [5 cmH20] 5 cmH20 Plateau Pressure:  [24 cmH20] 24 cmH20  INTAKE / OUTPUT: Intake/Output     10/14 0701 - 10/15 0700 10/15 0701 - 10/16 0700   I.V. (mL/kg) 1044.5 (9.2) 84.4 (0.7)   NG/GT 1070 100   Total Intake(mL/kg) 2114.5 (18.6) 184.4 (1.6)   Urine (mL/kg/hr) 45 (0) 5 (0)   Other 4800 (1.8)    Total Output 4845 5   Net -2730.5 +179.4        Stool Occurrence 1 x     PHYSICAL EXAMINATION: General: Comatose, not following commands Neuro: Unresponsive, localizes with RUE > LUE HEENT: [place]/AT. PERRL Cardiovascular: RRR, no M  Lungs: No wheezes Abdomen: Obese, soft, +BS Ext: symmetric BUE > BLE edema   LABS:  I have reviewed all of today's lab results. Relevant abnormalities are discussed in the A/P section  CXR: NSC CM, pulm edema pattern  ASSESSMENT / PLAN:  PULMONARY ETT 10/6 >>> A: Acute hypoxemic respiratory failure Acute pulmonary edema  - cardiogenic P:   Continue to PCV, trach today Needs aggressive volume off via HD Cont  vent bundle Daily SBT if/when meets criteria Plan for tracheostomy on Thursday at noon  CARDIOVASCULAR CVL R IJ 10/6 >>  A:  HTN crisis  Severe pulmonary edema Acute systolic v diastolic chf Elevated troponin (pk 4.24 10/07) P:  Wean nicardipine to off maintaining SBP < 140 mmHg Continue lopressor 50 mg PO BID with holding parameters Continue norvasc 10 mg PO daily with holding parameters Continue Catapres to 0.3 mg to skin Continue hydralazine to 50 q6 If remains on cardene will likely change lopressor to labetalol  for additional alpha effects PRN hydralazine ordered 10/09 PRN metoprolol ordered 10/09 Not a candidate for aggressive cardiac intervention presently - Cards not called yet  RENAL L IJ HD cath 10/08 >>  A:   Acute renal failure, oliguric Suspected CKD - likely hypertensive volume overload P:   Monitor BMET intermittently Monitor I/Os Correct electrolytes as indicated HD per renal, -5L anticipated today  GASTROINTESTINAL A:   Obesity P:   SUP: Pantoprazole Cont TFs  HEMATOLOGIC A:   ICU acquired anemia P:  DVT px: SCDs Monitor CBC intermittently Transfuse per usual ICU guidelines  INFECTIOUS A:   No overt infection P:   Monitor off abx   ENDOCRINE A:   Hyperglycemia, controlled P:   CBG's q4hr SSI  NEUROLOGIC A:   Hypertensive R basal ganglion hemorrhage  Acute encephalopathy P:   Mgmt per Stroke team Sedation: fentanyl drip, PRN midaz RASS goal: -1 to -2 Daily WUA.  FAMILY: Mother updated bedside.  TODAY'S SUMMARY: Anti-HTN increased as above (remains on a cardene drip), plan trach tomorrow at noon, hold weaning for now, hopefully a lot of volume negative as above.  35 mins CCM time  *Care during the described time interval was provided by me and/or other providers on the critical care team. I have reviewed this patient's available data, including medical history, events of note, physical examination and test results as part of my evaluation.  Alyson Reedy, M.D. [facility] Pulmonary/Critical Care Medicine. Pager: [phone]. After hours pager: [phone].

## 2014-03-02 NOTE — Progress Notes (Signed)
STROKE TEAM PROGRESS NOTE   HISTORY Lynn NationsStephanie Gentry is an 43 y.o. female with no previously documented medical disorder who [facility] with new onset slurred speech and left-sided weakness. Patient went to work as usual until 1500. She was found by her son at home at 1600 unable to get up out of a chair with left sided weakness, slurred speech. Patient was last known well around 3 PM this afternoon 02/21/2014. CT scan of her head showed an acute 4.1 x 1.5 cm right basal ganglia hemorrhage with mild mass effect and right to left shift of 5 mm. Patient's blood pressure was markedly elevated at 226/111. She was started on Cardene drip and blood pressure responded well. She has shown continual respiratory difficulty. She was initially given oxygen by nasal cannula followed by NBR, and subsequently placed on BiPAP. She continued to have oxygen saturations only in the high 80s to low 90s on 100% O2, with a respiratory rates in the 40s. ABG showed pH of 2.82, PCO2 37 PO2 of 57. Chest x-ray showed mild cardiomegaly with possible asymmetric pulmonary edema. BNP was 36,455. Because of deteriorating respiratory status intubation and mechanical ventilation was elected. CCM service was consulted. Patient was not administered TPA secondary to ICH. She was admitted to the neuro ICU  for further evaluation and treatment.   SUBJECTIVE (INTERVAL HISTORY) Pt had trach today. She has no acute neuro changes overnight. Same responsiveness to pain stimulation today with more withdraw on the right. Still on nicardipene drip. Tachycardia. Not able to have MRI done due to not able to lie flat secondary to severe pulmonary edema.   OBJECTIVE Temp:  [98.6 F (37 C)-101 F (38.3 C)] 98.6 F (37 C) (10/15 1557) Pulse Rate:  [77-111] 78 (10/15 1500) Cardiac Rhythm:  [-] Sinus tachycardia;Normal sinus rhythm (10/15 0800) Resp:  [16-38] 25 (10/15 1500) BP: (108-175)/(54-104) 108/54 mmHg (10/15 1500) SpO2:   [92 %-100 %] 98 % (10/15 1500) FiO2 (%):  [30 %-50 %] 50 % (10/15 1235) Weight:  [250 lb 10.6 oz (113.7 kg)] 250 lb 10.6 oz (113.7 kg) (10/15 0600)   Recent Labs Lab 03/01/14 2343 03/02/14 0344 03/02/14 0834 03/02/14 1321 03/02/14 1554  GLUCAP 140* 125* 135* 159* 128*    Recent Labs Lab 02/26/14 0400 02/27/14 0415 02/28/14 0350 03/01/14 0500 03/02/14 0500  NA 139 138 139 139 141  K 3.7 4.2 3.8 3.6* 4.2  CL 102 98 97 98 100  CO2 20 20 21 20 23   GLUCOSE 129* 129* 139* 135* 128*  BUN 59* 96* 83* 124* 77*  CREATININE 4.18* 3.93* 4.81* 6.58* 4.63*  CALCIUM 7.9* 9.1 9.2 9.2 8.9  MG 2.0 2.5 2.4 2.7* 2.3  PHOS 5.0* 7.0* 5.4* 5.6* 3.8    Recent Labs Lab 02/24/14 0450 02/25/14 0433 02/26/14 0400  AST 20  --   --   ALT 16  --   --   ALKPHOS 112  --   --   BILITOT 0.6  --   --   PROT 6.7  --   --   ALBUMIN 2.8* 2.8* 2.4*    Recent Labs Lab 02/26/14 0400 02/27/14 0415 02/28/14 0350 03/01/14 0500 03/02/14 0500  WBC 14.6* 18.6* 16.7* 13.3* 14.2*  HGB 8.2* 8.6* 8.4* 8.0* 8.3*  HCT 25.1* 27.1* 26.4* 24.7* 26.1*  MCV 85.1 86.0 84.6 85.8 87.0  PLT 235 273 249 268 293   No results found for this basename: CKTOTAL, CKMB, CKMBINDEX, TROPONINI,  in the  last 168 hours  Recent Labs  03/02/14 0845  LABPROT 17.8*  INR 1.45   No results found for this basename: COLORURINE, APPERANCEUR, LABSPEC, PHURINE, GLUCOSEU, HGBUR, BILIRUBINUR, KETONESUR, PROTEINUR, UROBILINOGEN, NITRITE, LEUKOCYTESUR,  in the last 72 hours     Component Value Date/Time   CHOL 177 02/28/2014 0350   Lab Results  Component Value Date   HGBA1C 5.8* 02/23/2014   No results found for this basename: labopia,  cocainscrnur,  labbenz,  amphetmu,  thcu,  labbarb    No results found for this basename: ETH,  in the last 168 hours  Ct Head Wo Contrast 02/28/14 - Stable 4.2 x 1.1 cm right lenticular nucleus hematoma with mild  surrounding vasogenic edema. Local mass effect upon the right  lateral  ventricle with slight bowing of the septum to the left by  3.9 mm without significant change.  This may represent result of hypertensive hemorrhage and can be  assessed as this clears.  Prominent white matter type changes asymmetric greater on the left  more conspicuous on the present exam than on the prior exam. The  patient may benefit from followup MR and MR angiogram for further  delineation. This may be ischemic in origin possibly related to  watershed type infarct. Other causes not excluded although felt to  be less likely.  02/24/2014 Stable appearance of the hematoma within the right basal ganglia. No new or progressive findings.  02/22/2014   1. No significant interval change in the patient's intraparenchymal hemorrhage at the right lateral basal ganglia, measuring 4.1 x 1.2 cm, with mild surrounding vasogenic edema. Mild associated mass effect, with 4 mm of leftward midline shift again seen. 2. Scattered small vessel ischemic microangiopathy. 3. Bilateral proptosis noted.     02/21/2014   1. Acute hemorrhage into the lateral right basal ganglia, likely indicating a hypertensive hemorrhage/hemorrhagic stroke. 2. Mild mass effect with shift of the midline to the left approximating 5 mm. Critical   US Renal Port 02/22/2014    1. Small echogenic kidneys consistent with chronic renal medical disease. No hydronephrosis. 2. The urinary bladder is decompressed by Foley catheter and cannot be evaluated.     Dg Chest Port 1 View 02/22/2014    1. Satisfactory positioning of endotracheal tube and right IJ line. 2. Cardiomegaly with persistent diffuse interstitial and airspace disease likely reflecting edema and congestive heart failure. 3. Bilateral pleural effusions and basilar airspace disease. This likely reflects atelectasis.    02/21/2014    Mild cardiac enlargement and possible asymmetric pulmonary edema. However, exam is limited by body habitus.     2D echo - - Left ventricle: The cavity size  was normal. There was severe concentric hypertrophy. Systolic function was normal. The estimated ejection fraction was in the range of 60% to 65%. Wall motion was normal; there were no regional wall motion abnormalities. - Left atrium: The atrium was mildly dilated. - Pulmonary arteries: PA peak pressure: 43 mm Hg (S). - Pericardium, extracardiac: A small, free-flowing pericardial effusion was identified posterior to the heart and circumferential to the heart. The fluid had no internal echoes.There was no evidence of hemodynamic compromise. Impressions: - The right ventricular systolic pressure was increased consistent with moderate pulmonary hypertension.  CUS - - Findings consistent with 1-39 percent stenosis involving the right internal carotid artery and the left internal carotid artery. - Right vertebral artery not imaged due to central line dressing. Left vertebral artery not imaged due to patient position.  DVT - -  No obvious evidence of deep vein or superficial thrombosis involving the right lower extremity and left lower extremity. - No evidence of Baker&'s cyst on the right or left.  EEG pending  A1C 5.8 and LDL 92   PHYSICAL EXAM Young obese Caucasian lady not in distress..Intubated and sedated. Afebrile. Head is nontraumatic. Neck is supple without bruit.    l. Cardiac exam soft ejection murmur RRR .no gallop. Lungs bilateral crackles and  rhonchi to auscultation. Distal pulses are well felt. Gen: intubated and sedated   Neurological Exam: still intubate, eyes not open and not following commands. Slight downgaze preference. Eyes conjugate. Pupils 3 mm reactive. Fundi not visualized. Left lower face weakness, corneal and gag present. On pain stimulation, she had bilateral trace withdraw to pain, more at RUE and RLE and LLE and less on LUE. Left Plantar upgoing and right down going.  ASSESSMENT/PLAN Ms. Lynn Gentry is a 43 y.o. female with no documented medical  history found with new onset slurred speech and left-sided weakness. CT showed right external capsule hemorrhage. However, her condition getting worse with pulmonary edema, AKI requiring HD, respiratory failure needing ventilation. Transferred pt to CCM for better management.  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, nondominant side with cytotoxic edema, mild transfalcine herniation, pulmonary edema, respiratory and renal failure Repeat CT showed left subcortical hypodensity likely ischemic etiology  CT head 10/9 showed stable hematoma, will repeat CT head today  CT head repeat 10/13 showed stable hematoma on the right but more clear left MCA ischemic changes  MRI and MRA not able to be done since pt not able to lie flat.   EEG showed severe slowing but no seizure.  2D echo unremarkable  Carotid doppler unremarkable  HgbA1c 5.8  Heparin subq for VTE prophylaxis  trach done today  Resultant VDRF, left hemiparesis  no antithrombotics prior to admission  Ongoing aggressive risk factor management  Acute Respiratory Failure with severe acute pulmonary edema  Intubated and now trach 03/02/14  CCM following  Diuresing, IV Lasix, Hemodialysis, urine output minimal today  CXR series  Malignant Hypertension  BP 226/111 at onset  Home meds:   none  SBP goal < 150 due to presence of end organ damage  On amlodipine, clonidine patch and metoprolol and hydralazine  try to wean off cardene   Acute renal failure with volume overload, suspect baseline chronic kidney disease  Diuresis and management per critical care, on HD  Continue to trend Cre.  HD today  Other Stroke Risk Factors ETOH use   Morbid Obesity, Body mass index is 43.01 kg/(m^2).   Hospital day # 9 03/02/2014 4:55 PM  This patient is critically ill due to cerebral hemorrhage, respiratory failure, renal failure and at significant risk of neurological worsening, death  form hematoma expansion, cerebral edema, brain herniation. This patient's care requires constant monitoring of vital signs, hemodynamics, respiratory and cardiac monitoring, review of multiple databases, neurological assessment, discussion with family, other specialists and medical decision making of high complexity. I spent 35 minutes of neurocritical care time in the care of this patient.   Marvel Plan, MD PhD Stroke Neurology 03/02/2014 4:55 PM   To contact Stroke Continuity provider, please refer to [email or web address]. After hours, contact General Neurology

## 2014-03-02 NOTE — Procedures (Signed)
Percutaneous Tracheostomy Placement   Consent from husband, patient sedated, paralyzed and positioned. Area cleaned, lidocaine/epi injected, skin incision done followed by blunt dissection, airway entered and visualized bronchoscopically, wire passed and visualized. Airway crushed and dilated. Size 6 cuffed shiley tracheostomy placed. Visualized well above carina bronchoscopically. CXR ordered and pending.   Shailah Gibbins G. Beula Joyner, M.D.  [facility] Pulmonary/Critical Care Medicine.  Pager: [phone].  After hours pager: [phone].  

## 2014-03-02 NOTE — Procedures (Signed)
Bronchoscopy Procedure Note Lynn Gentry [id number] 02-18-1971  Procedure: Bronchoscopy Indications: Tracheostomy Placement  Procedure Details Consent: Risks of procedure as well as the alternatives and risks of each were explained to the (patient/caregiver).  Consent for procedure obtained. Time Out: Verified patient identification, verified procedure, site/side was marked, verified correct patient position, special equipment/implants available, medications/allergies/relevent history reviewed, required imaging and test results available.  Performed  In preparation for procedure, patient was given 100% FiO2 and bronchoscope lubricated. Sedation: Fentanyl, Versed, Etomidate  Airway entered and the following bronchi were examined: Upper Airways only for direct visualization of trach placement  Procedures performed: tracheostomy placement.  Thick yellow/white circumferential coating noted in upper airway, fetid odor from mouth.   Bronchoscope removed.    Evaluation Hemodynamic Status: BP stable throughout; O2 sats: stable throughout Patient's Current Condition: stable Specimens:  None Complications: No apparent complications Patient did tolerate procedure well.   Procedure performed under direct supervision of Dr. Tyson Alias and with video bronchoscopy.     Canary Brim, NP-C [place] [facility] Pgr: [phone] or [phone]   Performed with  Mcarthur Rossetti. Tyson Alias, MD, FACP Pgr: [phone] [facility] [facility]

## 2014-03-03 ENCOUNTER — Inpatient Hospital Stay (HOSPITAL_COMMUNITY): Payer: 59

## 2014-03-03 ENCOUNTER — Encounter (HOSPITAL_COMMUNITY): Payer: Self-pay | Admitting: General Surgery

## 2014-03-03 DIAGNOSIS — Z930 Tracheostomy status: Secondary | ICD-10-CM

## 2014-03-03 LAB — BLOOD GAS, ARTERIAL
ACID-BASE DEFICIT: 0.7 mmol/L (ref 0.0–2.0)
Bicarbonate: 23.2 mEq/L (ref 20.0–24.0)
Drawn by: 25203
FIO2: 0.5 %
O2 Saturation: 98.9 %
PCO2 ART: 37.3 mmHg (ref 35.0–45.0)
PEEP: 5 cmH2O
PH ART: 7.413 (ref 7.350–7.450)
PO2 ART: 148 mmHg — AB (ref 80.0–100.0)
Patient temperature: 99.2
Pressure control: 20 cmH2O
RATE: 20 resp/min
TCO2: 24.3 mmol/L (ref 0–100)

## 2014-03-03 LAB — CBC
HCT: 26.7 % — ABNORMAL LOW (ref 36.0–46.0)
Hemoglobin: 8.3 g/dL — ABNORMAL LOW (ref 12.0–15.0)
MCH: 28.1 pg (ref 26.0–34.0)
MCHC: 31.1 g/dL (ref 30.0–36.0)
MCV: 90.5 fL (ref 78.0–100.0)
PLATELETS: 281 10*3/uL (ref 150–400)
RBC: 2.95 MIL/uL — AB (ref 3.87–5.11)
RDW: 16.3 % — AB (ref 11.5–15.5)
WBC: 15.1 10*3/uL — ABNORMAL HIGH (ref 4.0–10.5)

## 2014-03-03 LAB — BASIC METABOLIC PANEL
Anion gap: 19 — ABNORMAL HIGH (ref 5–15)
BUN: 102 mg/dL — ABNORMAL HIGH (ref 6–23)
CO2: 23 mEq/L (ref 19–32)
Calcium: 9.1 mg/dL (ref 8.4–10.5)
Chloride: 96 mEq/L (ref 96–112)
Creatinine, Ser: 6.56 mg/dL — ABNORMAL HIGH (ref 0.50–1.10)
GFR, EST AFRICAN AMERICAN: 8 mL/min — AB (ref >90)
GFR, EST NON AFRICAN AMERICAN: 7 mL/min — AB (ref >90)
Glucose, Bld: 117 mg/dL — ABNORMAL HIGH (ref 70–99)
Potassium: 4.5 mEq/L (ref 3.7–5.3)
SODIUM: 138 meq/L (ref 137–147)

## 2014-03-03 LAB — GLUCOSE, CAPILLARY
GLUCOSE-CAPILLARY: 108 mg/dL — AB (ref 70–99)
GLUCOSE-CAPILLARY: 116 mg/dL — AB (ref 70–99)
GLUCOSE-CAPILLARY: 148 mg/dL — AB (ref 70–99)
Glucose-Capillary: 118 mg/dL — ABNORMAL HIGH (ref 70–99)
Glucose-Capillary: 118 mg/dL — ABNORMAL HIGH (ref 70–99)
Glucose-Capillary: 137 mg/dL — ABNORMAL HIGH (ref 70–99)

## 2014-03-03 LAB — MAGNESIUM: MAGNESIUM: 2.5 mg/dL (ref 1.5–2.5)

## 2014-03-03 LAB — PHOSPHORUS: Phosphorus: 7 mg/dL — ABNORMAL HIGH (ref 2.3–4.6)

## 2014-03-03 MED ORDER — PIPERACILLIN-TAZOBACTAM IN DEX 2-0.25 GM/50ML IV SOLN
2.2500 g | Freq: Three times a day (TID) | INTRAVENOUS | Status: DC
  Administered 2014-03-03 – 2014-03-07 (×12): 2.25 g via INTRAVENOUS
  Filled 2014-03-03 (×14): qty 50

## 2014-03-03 MED ORDER — VANCOMYCIN HCL 10 G IV SOLR
1500.0000 mg | Freq: Once | INTRAVENOUS | Status: AC
  Administered 2014-03-03: 1500 mg via INTRAVENOUS
  Filled 2014-03-03: qty 1500

## 2014-03-03 MED ORDER — HEPARIN SODIUM (PORCINE) 5000 UNIT/ML IJ SOLN
5000.0000 [IU] | Freq: Three times a day (TID) | INTRAMUSCULAR | Status: DC
  Administered 2014-03-07 – 2014-03-21 (×42): 5000 [IU] via SUBCUTANEOUS
  Filled 2014-03-03 (×50): qty 1

## 2014-03-03 NOTE — Progress Notes (Signed)
RT attempted to wean patient 10/5. Patient rate increased to 45 and became very agitated. RT will try again later.

## 2014-03-03 NOTE — H&P (Signed)
Reason for Consult: Tunneled HD catheter placement Chief Complaint: Chief Complaint  Patient presents with  . Cerebrovascular Accident    Referring Physician(s): Nephrology- [place]   History of Present Illness: Lynn Gentry is a 43 y.o. female with VDRF secondary to hemorrhagic stroke. Patient also with malignant HTN, acute on chronic kidney disease and currently undergoing HD with temporary catheter left IJ placed 10/8 and given fevers will remove over the weekend and plan for tunneled HD Monday per Nephrology request.    History reviewed. No pertinent past medical history.  Past Surgical History  Procedure Laterality Date  . Peg placement N/A 03/02/2014    Procedure: PERCUTANEOUS ENDOSCOPIC GASTROSTOMY (PEG) PLACEMENT;  Surgeon: Violeta Gelinas, MD;  Location: [facility];  Service: General;  Laterality: N/A;    Allergies: Review of patient's allergies indicates no known allergies.  Medications: Prior to Admission medications   Medication Sig Start Date End Date Taking? Authorizing Provider  cetirizine (ZYRTEC) 10 MG tablet Take 10 mg by mouth daily as needed for allergies.   Yes Historical Provider, MD    No family history on file.  History   Social History  . Marital Status: Married    Spouse Name: N/A    Number of Children: N/A  . Years of Education: N/A   Social History Main Topics  . Smoking status: Never Smoker   . Smokeless tobacco: None  . Alcohol Use: Yes  . Drug Use: No  . Sexual Activity: None   Other Topics Concern  . None   Social History Narrative  . None   Review of Systems: A 12 point ROS discussed and pertinent positives are indicated in the HPI above.  All other systems are negative.  Review of Systems unable to obtain patient is intubated.   Vital Signs: BP 137/73  Pulse 94  Temp(Src) 99.5 F (37.5 C) (Axillary)  Resp 30  Ht 5\' 4"  (1.626 m)  Wt 253 lb 12 oz (115.1 kg)  BMI 43.53 kg/m2  SpO2 100%  Physical Exam  unable to obtain procedure going on in patient's room. General: Intubated  Imaging: Ct Head Wo Contrast  02/28/2014   CLINICAL DATA:  43 year old hypertensive female with intracranial hemorrhage. Follow-up. Subsequent encounter.  EXAM: CT HEAD WITHOUT CONTRAST  TECHNIQUE: Contiguous axial images were obtained from the base of the skull through the vertex without intravenous contrast.  COMPARISON:  Four prior exams most recent 02/24/2014.  FINDINGS: Stable 4.2 x 1.1 cm right lenticular nucleus hematoma with mild surrounding vasogenic edema. Local mass effect upon the right lateral ventricle with slight bowing of the septum to the left by 3.9 mm without significant change.  This may represent result of hypertensive hemorrhage and can be assessed as this clears.  Prominent white matter type changes asymmetric greater on the left more conspicuous on the present exam than on the prior exam. The patient may benefit from followup MR and MR angiogram for further delineation. This may be ischemic in origin possibly related to watershed type infarct. Other causes not excluded although felt to be less likely.  Exophthalmos.  IMPRESSION: Stable 4.2 x 1.1 cm right lenticular nucleus hematoma with mild surrounding vasogenic edema. Local mass effect upon the right lateral ventricle with slight bowing of the septum to the left by 3.9 mm without significant change.  This may represent result of hypertensive hemorrhage and can be assessed as this clears.  Prominent white matter type changes asymmetric greater on the left more conspicuous on the present exam  than on the prior exam. The patient may benefit from followup MR and MR angiogram for further delineation. This may be ischemic in origin possibly related to watershed type infarct. Other causes not excluded although felt to be less likely.  These results will be called to the ordering clinician or representative by the Radiologist Assistant, and communication documented in  the PACS or zVision Dashboard.   Electronically Signed   By: Bridgett LarssonSteve  Olson M.D.   On: 02/28/2014 12:57   Dg Chest Port 1 View  02/28/2014   CLINICAL DATA:  43 year old female with acute intracranial hemorrhage. Intubated. Respiratory failure. Initial encounter.  EXAM: PORTABLE CHEST - 1 VIEW  COMPARISON:  02/27/2014 and earlier.  FINDINGS: Portable AP semi upright view at 0532 hrs.  Endotracheal tube tip now between the level the clavicles and carina. Stable left IJ approach dual lumen catheter. Stable right IJ single lumen catheter. Enteric tube courses to the abdomen.  Large body habitus and continued low lung volumes. Stable cardiac size and mediastinal contours. Improved ventilation since 02/21/2014. No pneumothorax, large pleural effusion or consolidation. Perihilar atelectasis suspected.  IMPRESSION: 1. Endotracheal tube in good position. Otherwise, stable lines and tubes. 2. Large body habitus and low lung volumes with atelectasis.   Electronically Signed   By: Augusto GambleLee  Hall M.D.   On: 02/28/2014 07:26    Labs:  CBC:  Recent Labs  02/28/14 0350 03/01/14 0500 03/02/14 0500 03/03/14 0500  WBC 16.7* 13.3* 14.2* 15.1*  HGB 8.4* 8.0* 8.3* 8.3*  HCT 26.4* 24.7* 26.1* 26.7*  PLT 249 268 293 281    COAGS:  Recent Labs  02/21/14 1900 03/02/14 0845  INR 1.14 1.45  APTT  --  27    BMP:  Recent Labs  02/28/14 0350 03/01/14 0500 03/02/14 0500 03/03/14 0500  NA 139 139 141 138  K 3.8 3.6* 4.2 4.5  CL 97 98 100 96  CO2 21 20 23 23   GLUCOSE 139* 135* 128* 117*  BUN 83* 124* 77* 102*  CALCIUM 9.2 9.2 8.9 9.1  CREATININE 4.81* 6.58* 4.63* 6.56*  GFRNONAA 10* 7* 11* 7*  GFRAA 12* 8* 12* 8*    LIVER FUNCTION TESTS:  Recent Labs  02/21/14 1900 02/24/14 0450 02/25/14 0433 02/26/14 0400  BILITOT 0.9 0.6  --   --   AST 25 20  --   --   ALT 30 16  --   --   ALKPHOS 152* 112  --   --   PROT 7.9 6.7  --   --   ALBUMIN 3.7 2.8* 2.8* 2.4*    Assessment and Plan: Acute on  chronic kidney disease with temporary IJ catheter in place now and plan for removal this weekend secondary to fevers and place new tunneled HD catheter Monday if afebrile. Hemorrhagic stroke, neurology on board  VDRF per PCCM Malignant HTN Malnutrition s/p PEG IR will contact patient's spouse for consent.  Will need to be NPO, any blood thinners held and afebrile > 48 hours.      I spent a total of 20 minutes face to face in clinical consultation, greater than 50% of which was counseling/coordinating care  Signed: Berneta LevinsMORGAN, Krystale Rinkenberger D 03/03/2014, 4:05 PM

## 2014-03-03 NOTE — Progress Notes (Signed)
PULMONARY / CRITICAL CARE MEDICINE   Name: Lynn Gentry MRN: [id number] DOB: [birth year]    ADMISSION DATE:  02/21/2014 CONSULTATION DATE:  03/03/2014  REFERRING MD :  Pearlean Brownie  CHIEF COMPLAINT:  Respiratory failure in setting of ICH  INITIAL PRESENTATION: 43 y.o. F brought to [facility] ED on 10/6 with left sided weakness, slurred speech, inability to stand up.  In ED, found to have acute lateral right basal ganglia ICH with mild mass effect and 65mm MLS.  Admitted to neuro ICU and developed acute respiratory failure, failed bipap and PCCM called for intubation and management. She was admitted to neuro ICU and later that evening had increasing O2 demands.  She had no improvement in SpO2, work of breathing, and mental status despite 3 hours of BiPAP.  PCCM was consulted for intubation.  STUDIES:  10/06 CT head:  acute hemorrhage into the lateral right basal ganglia, likely indicating a hypertensive hemorrhage/hemorrhagic stroke.  Mild mass effect with 60mm MLS to the left. 10/07 Renal US:  echogenic kidneys s/w ckd, no hydro.  10/07 CT head: [facility] 10/07 repeat CT head: [facility] 10/07 Doppler carotid: consistent with 1-39% stenosis involving the R internal carotid art and the L internal carotid art 10/07 TTE: LVEF 60-65%. Severe concentric LVH. LA dilatation. PASP est 43 mmHg 10/09 CT head: [facility] 10/09 LE venous Dopplers (ordered by Stroke) >>  10/15 Trach (JY).  SIGNIFICANT EVENTS: 10/6 - admitted for ICH, developed respiratory failure requiring intubation. 10/07 Persistent hypertension. Remained on nicardipine gtt. Persistent volume overload snd pulm edema pattern on CXR. Lasix gtt resulted in minimal increase in Uo 10/08 Worsening renal function, oliguria. Renal consult. HD performed 10/09 Acute change in neuro status. STAT CT head ordered: [facility] 10/09 Further HD planned 10/14 HD with negative fluid balance 10/16 Febrile and purulent matter from nasal cavity and trach.  VITAL SIGNS: Temp:  [98 F  (36.7 C)-100.3 F (37.9 C)] 100.3 F (37.9 C) (10/16 0800) Pulse Rate:  [76-102] 100 (10/16 0800) Resp:  [16-39] 26 (10/16 0800) BP: (108-187)/(54-94) 149/76 mmHg (10/16 0800) SpO2:  [92 %-100 %] 100 % (10/16 0800) FiO2 (%):  [40 %-50 %] 40 % (10/16 0734) Weight:  [114.9 kg (253 lb 4.9 oz)] 114.9 kg (253 lb 4.9 oz) (10/16 0500)  HEMODYNAMICS: CVP:  [8 mmHg-17 mmHg] 14 mmHg  VENTILATOR SETTINGS: Vent Mode:  [-] PCV FiO2 (%):  [40 %-50 %] 40 % Set Rate:  [20 bmp] 20 bmp PEEP:  [5 cmH20] 5 cmH20 Plateau Pressure:  [23 cmH20-33 cmH20] 26 cmH20  INTAKE / OUTPUT: Intake/Output     10/15 0701 - 10/16 0700 10/16 0701 - 10/17 0700   I.V. (mL/kg) 146.9 (1.3)    Other 220    NG/GT 109.7 10   IV Piggyback 100    Total Intake(mL/kg) 576.6 (5) 10 (0.1)   Urine (mL/kg/hr) 98 (0)    Other     Total Output 98     Net +478.6 +10         PHYSICAL EXAMINATION: General: Comatose, not following commands Neuro: Unresponsive, localizes with RUE > LUE HEENT: [place]/AT. PERRL Cardiovascular: RRR, no M  Lungs: No wheezes Abdomen: Obese, soft, +BS Ext: symmetric BUE > BLE edema   LABS:  I have reviewed all of today's lab results. Relevant abnormalities are discussed in the A/P section  CXR: NSC CM, pulm edema pattern  ASSESSMENT / PLAN:  PULMONARY ETT 10/6 >>> A: Acute hypoxemic respiratory failure Acute pulmonary edema  - cardiogenic P:  Attempt TC trials today. Needs aggressive volume off via HD, appreciate input from renal Cont vent bundle PRN BD.  CARDIOVASCULAR CVL R IJ 10/6 >>  A:  HTN crisis  Severe pulmonary edema Acute systolic v diastolic chf Elevated troponin (pk 4.24 10/07) P:  Wean nicardipine to off maintaining SBP < 140 mmHg Continue lopressor 50 mg PO BID with holding parameters Continue norvasc 10 mg PO daily with holding parameters Continue Catapres to 0.3 mg to skin Continue hydralazine to 50 q6 D/C cardene PRN hydralazine ordered 10/09 PRN metoprolol  ordered 10/09 Not a candidate for aggressive cardiac intervention presently - Cards not called yet  RENAL L IJ HD cath 10/08 >>  A:   Acute renal failure, oliguric Suspected CKD - likely hypertensive volume overload P:   Monitor BMET intermittently Monitor I/Os Correct electrolytes as indicated HD per renal, -5L anticipated today  GASTROINTESTINAL A:   Obesity P:   SUP: Pantoprazole Cont TFs  HEMATOLOGIC A:   ICU acquired anemia P:  DVT px: SCDs Monitor CBC intermittently Transfuse per usual ICU guidelines  INFECTIOUS A:   Purulent nasal discharge, febrile, WBC rising and ?tracheitis. P:   Sputum culture 5/15>>> Blood culture 5/16>>> Fluconazole (continue until tracheal aspirate results, if negative then D/C) 5/15>>> Vancomycin 10/16>>> Zosyn 10/16>>>  ENDOCRINE A:   Hyperglycemia, controlled P:   CBG's q4hr SSI  NEUROLOGIC A:   Hypertensive R basal ganglion hemorrhage  Acute encephalopathy P:   Mgmt per Stroke team Sedation: fentanyl drip, PRN midaz RASS goal: -1 to -2 Daily WUA.  FAMILY: Mother updated bedside.  TODAY'S SUMMARY: BP more stable at this point with current regiment, will begin anti-bacterial given above, begin TC trials today, if tolerated would consider transfer to the SDU but would like blessing from neuro prior to transfer.  35 mins CCM time  *Care during the described time interval was provided by me and/or other providers on the critical care team. I have reviewed this patient's available data, including medical history, events of note, physical examination and test results as part of my evaluation.  Alyson ReedyWesam G. Yacoub, M.D. [facility]. Pager: [phone]. After hours pager: [phone].

## 2014-03-03 NOTE — Progress Notes (Signed)
eLink Physician-Brief Progress Note Patient Name: Richell Marotz DOB: 1970/12/08 MRN: [id number]   Date of Service  03/03/2014  HPI/Events of Note  Arterial catheter is 86 days old. BP stable. NIBP correlating reasonably well. Not on vasopressors  eICU Interventions  DC A line     Intervention Category Minor Interventions: Routine modifications to care plan (e.g. PRN medications for pain, fever)  Billy Fischer 03/03/2014, 12:23 AM

## 2014-03-03 NOTE — Progress Notes (Signed)
STROKE TEAM PROGRESS NOTE   HISTORY Lynn Gentry is an 43 y.o. female with no previously documented medical disorder who was brought to [facility] with new onset slurred speech and left-sided weakness. Patient went to work as usual until 1500. She was found by her son at home at 1600 unable to get up out of a chair with left sided weakness, slurred speech. Patient was last known well around 3 PM this afternoon 02/21/2014. CT scan of her head showed an acute 4.1 x 1.5 cm right basal ganglia hemorrhage with mild mass effect and right to left shift of 5 mm. Patient's blood pressure was markedly elevated at 226/111. She was started on Cardene drip and blood pressure responded well. She has shown continual respiratory difficulty. She was initially given oxygen by nasal cannula followed by NBR, and subsequently placed on BiPAP. She continued to have oxygen saturations only in the high 80s to low 90s on 100% O2, with a respiratory rates in the 40s. ABG showed pH of 2.82, PCO2 37 PO2 of 57. Chest x-ray showed mild cardiomegaly with possible asymmetric pulmonary edema. BNP was 36,455. Because of deteriorating respiratory status intubation and mechanical ventilation was elected. CCM service was consulted. Patient was not administered TPA secondary to ICH. She was admitted to the neuro ICU  for further evaluation and treatment.   SUBJECTIVE (INTERVAL HISTORY) Pt still on vent with trach. She has no acute neuro changes overnight. Same responsiveness to pain stimulation today with more withdraw on the right. off nicardipene drip now. Not able to have MRI done due to not able to lie flat secondary to severe pulmonary edema.   OBJECTIVE Temp:  [98 F (36.7 C)-100.3 F (37.9 C)] 100.3 F (37.9 C) (10/16 0800) Pulse Rate:  [76-112] 90 (10/16 1400) Cardiac Rhythm:  [-] Normal sinus rhythm (10/16 0800) Resp:  [20-39] 28 (10/16 1400) BP: (108-187)/(54-87) 134/72 mmHg (10/16 1400) SpO2:  [96 %-100 %] 100 %  (10/16 1400) FiO2 (%):  [40 %-50 %] 40 % (10/16 1119) Weight:  [253 lb 4.9 oz (114.9 kg)] 253 lb 4.9 oz (114.9 kg) (10/16 0500)   Recent Labs Lab 03/02/14 2001 03/03/14 0007 03/03/14 0428 03/03/14 0809 03/03/14 1209  GLUCAP 91 108* 118* 116* 148*    Recent Labs Lab 02/27/14 0415 02/28/14 0350 03/01/14 0500 03/02/14 0500 03/03/14 0500  NA 138 139 139 141 138  K 4.2 3.8 3.6* 4.2 4.5  CL 98 97 98 100 96  CO2 20 21 20 23 23   GLUCOSE 129* 139* 135* 128* 117*  BUN 96* 83* 124* 77* 102*  CREATININE 3.93* 4.81* 6.58* 4.63* 6.56*  CALCIUM 9.1 9.2 9.2 8.9 9.1  MG 2.5 2.4 2.7* 2.3 2.5  PHOS 7.0* 5.4* 5.6* 3.8 7.0*    Recent Labs Lab 02/25/14 0433 02/26/14 0400  ALBUMIN 2.8* 2.4*    Recent Labs Lab 02/27/14 0415 02/28/14 0350 03/01/14 0500 03/02/14 0500 03/03/14 0500  WBC 18.6* 16.7* 13.3* 14.2* 15.1*  HGB 8.6* 8.4* 8.0* 8.3* 8.3*  HCT 27.1* 26.4* 24.7* 26.1* 26.7*  MCV 86.0 84.6 85.8 87.0 90.5  PLT 273 249 268 293 281   No results found for this basename: CKTOTAL, CKMB, CKMBINDEX, TROPONINI,  in the last 168 hours  Recent Labs  03/02/14 0845  LABPROT 17.8*  INR 1.45   No results found for this basename: COLORURINE, APPERANCEUR, LABSPEC, PHURINE, GLUCOSEU, HGBUR, BILIRUBINUR, KETONESUR, PROTEINUR, UROBILINOGEN, NITRITE, LEUKOCYTESUR,  in the last 72 hours     Component Value Date/Time  CHOL 177 02/28/2014 0350   Lab Results  Component Value Date   HGBA1C 5.8* 02/23/2014   No results found for this basename: labopia,  cocainscrnur,  labbenz,  amphetmu,  thcu,  labbarb    No results found for this basename: ETH,  in the last 168 hours  Ct Head Wo Contrast 02/28/14 - Stable 4.2 x 1.1 cm right lenticular nucleus hematoma with mild  surrounding vasogenic edema. Local mass effect upon the right  lateral ventricle with slight bowing of the septum to the left by  3.9 mm without significant change.  This may represent result of hypertensive hemorrhage and  can be  assessed as this clears.  Prominent white matter type changes asymmetric greater on the left  more conspicuous on the present exam than on the prior exam. The  patient may benefit from followup MR and MR angiogram for further  delineation. This may be ischemic in origin possibly related to  watershed type infarct. Other causes not excluded although felt to  be less likely.  02/24/2014 Stable appearance of the hematoma within the right basal ganglia. No new or progressive findings.  02/22/2014   1. No significant interval change in the patient's intraparenchymal hemorrhage at the right lateral basal ganglia, measuring 4.1 x 1.2 cm, with mild surrounding vasogenic edema. Mild associated mass effect, with 4 mm of leftward midline shift again seen. 2. Scattered small vessel ischemic microangiopathy. 3. Bilateral proptosis noted.     02/21/2014   1. Acute hemorrhage into the lateral right basal ganglia, likely indicating a hypertensive hemorrhage/hemorrhagic stroke. 2. Mild mass effect with shift of the midline to the left approximating 5 mm. Critical   US Renal Port 02/22/2014    1. Small echogenic kidneys consistent with chronic renal medical disease. No hydronephrosis. 2. The urinary bladder is decompressed by Foley catheter and cannot be evaluated.     Dg Chest Port 1 View 02/22/2014    1. Satisfactory positioning of endotracheal tube and right IJ line. 2. Cardiomegaly with persistent diffuse interstitial and airspace disease likely reflecting edema and congestive heart failure. 3. Bilateral pleural effusions and basilar airspace disease. This likely reflects atelectasis.    02/21/2014    Mild cardiac enlargement and possible asymmetric pulmonary edema. However, exam is limited by body habitus.     2D echo - - Left ventricle: The cavity size was normal. There was severe concentric hypertrophy. Systolic function was normal. The estimated ejection fraction was in the range of 60% to 65%.  Wall motion was normal; there were no regional wall motion abnormalities. - Left atrium: The atrium was mildly dilated. - Pulmonary arteries: PA peak pressure: 43 mm Hg (S). - Pericardium, extracardiac: A small, free-flowing pericardial effusion was identified posterior to the heart and circumferential to the heart. The fluid had no internal echoes.There was no evidence of hemodynamic compromise. Impressions: - The right ventricular systolic pressure was increased consistent with moderate pulmonary hypertension.  CUS - - Findings consistent with 1-39 percent stenosis involving the right internal carotid artery and the left internal carotid artery. - Right vertebral artery not imaged due to central line dressing. Left vertebral artery not imaged due to patient position.  DVT - - No obvious evidence of deep vein or superficial thrombosis involving the right lower extremity and left lower extremity. - No evidence of Baker&'s cyst on the right or left.  EEG 02/28/14 - This EEG is abnormal with severe generalized slowing of cerebral activity consistent with severe encephalopathic state. These  findings are nonspecific and can be seen with metabolic as well as toxic and degenerative encephalopathies. No evidence of an epileptic disorder was demonstrated.  A1C 5.8 and LDL 92   PHYSICAL EXAM Young obese Caucasian lady not in distress..Intubated and sedated. Afebrile. Head is nontraumatic. Neck is supple without bruit.    l. Cardiac exam soft ejection murmur RRR .no gallop. Lungs bilateral crackles and  rhonchi to auscultation. Distal pulses are well felt. Gen: intubated and sedated   Neurological Exam: still intubate, eyes not open and not following commands. Slight downgaze preference. Eyes conjugate. Pupils 3 mm reactive. Fundi not visualized. Left lower face weakness, corneal and gag present. On pain stimulation, she had bilateral trace withdraw to pain, more at RUE and RLE and LLE and less  on LUE. Left Plantar upgoing and right down going.  ASSESSMENT/PLAN Ms. Lynn Gentry is a 43 y.o. female with no documented medical history found with new onset slurred speech and left-sided weakness. CT showed right external capsule hemorrhage. However, her condition getting worse with pulmonary edema, AKI requiring HD, respiratory failure needing ventilation. Transferred pt to CCM for better management.  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, nondominant side with cytotoxic edema, mild transfalcine herniation, pulmonary edema, respiratory and renal failure Repeat CT showed left subcortical hypodensity likely ischemic etiology  CT head 10/9 showed stable hematoma, will repeat CT head today  CT head repeat 10/13 showed stable hematoma on the right but more clear left MCA ischemic changes  MRI and MRA not able to be done since pt not able to lie flat.   EEG showed severe slowing but no seizure.  2D echo unremarkable  Carotid doppler unremarkable  HgbA1c 5.8  Heparin subq for VTE prophylaxis  trach done and plan for ATC trial today  Resultant VDRF, left hemiparesis  no antithrombotics prior to admission  Ongoing aggressive risk factor management  Acute Respiratory Failure with severe acute pulmonary edema  Intubated and now trach 03/02/14  CCM following  Diuresing, IV Lasix, Hemodialysis, urine output minimal today  CXR series  Malignant Hypertension  BP 226/111 at onset  Home meds:   none  SBP goal < 150 due to presence of end organ damage  On amlodipine, clonidine patch and metoprolol and hydralazine  off cardene now  Acute renal failure with volume overload, suspect baseline chronic kidney disease  Diuresis and management per critical care, on HD  Continue to trend Cre.  HD today  Other Stroke Risk Factors ETOH use   Morbid Obesity, Body mass index is 43.46 kg/(m^2).   Hospital day # 10 03/03/2014 2:29  PM  This patient is critically ill due to cerebral hemorrhage, respiratory failure, renal failure and at significant risk of neurological worsening, death form hematoma expansion, cerebral edema, brain herniation. This patient's care requires constant monitoring of vital signs, hemodynamics, respiratory and cardiac monitoring, review of multiple databases, neurological assessment, discussion with family, other specialists and medical decision making of high complexity. I spent 35 minutes of neurocritical care time in the care of this patient.   Marvel PlanJindong Avyan Livesay, MD PhD Stroke Neurology 03/03/2014 2:29 PM   To contact Stroke Continuity provider, please refer to [email or web address]. After hours, contact General Neurology

## 2014-03-03 NOTE — Progress Notes (Signed)
ANTIBIOTIC CONSULT NOTE - INITIAL  Pharmacy Consult for vancomycin, zosyn Indication: pneumonia  No Known Allergies  Patient Measurements: Height: 5\' 4"  (162.6 cm) Weight: 253 lb 4.9 oz (114.9 kg) IBW/kg (Calculated) : 54.7 Adjusted Body Weight:   Vital Signs: Temp: 100.3 F (37.9 C) (10/16 0800) Temp Source: Axillary (10/16 0800) BP: 149/76 mmHg (10/16 0800) Pulse Rate: 100 (10/16 0800) Intake/Output from previous day: 10/15 0701 - 10/16 0700 In: 576.6 [I.V.:146.9; NG/GT:109.7; IV Piggyback:100] Out: 98 [Urine:98] Intake/Output from this shift: Total I/O In: 10 [NG/GT:10] Out: -   Labs:  Recent Labs  03/01/14 0500 03/02/14 0500 03/03/14 0500  WBC 13.3* 14.2* 15.1*  HGB 8.0* 8.3* 8.3*  PLT 268 293 281  CREATININE 6.58* 4.63* 6.56*   Medical History: History reviewed. No pertinent past medical history.  Medications:  Prescriptions prior to admission  Medication Sig Dispense Refill  . cetirizine (ZYRTEC) 10 MG tablet Take 10 mg by mouth daily as needed for allergies.       Assessment: 43 yo female with no significant past medial history admitted on 10/8 for hypertensive emergency and intracerebral hemorrhage.  Remains ventilated, now with concern for pneumonia. Pt with purulent drainage, mild leukocytosis, Tmax/24h 101. Pt is oliguric, has been undergoing intermittent HD.  Last HD was 10/14-pt tolerated 4hr @ BFR 400.    Goal of Therapy:  Vancomycin trough level 15-20 mcg/ml   Plan:  -Zosyn 2.25 g IV q8h -Vancomycin 1500 mg IV x1 -Monitor renal function, cultures, VR as needed, duration of therapy -F/u HD schedule, reorder vancomycin as indicated   Agapito Games, PharmD, BCPS Clinical Pharmacist Pager: [phone] 03/03/2014 9:01 AM

## 2014-03-03 NOTE — Progress Notes (Signed)
Patient ID: Lynn Gentry, female   DOB: Oct 01, 1970, 43 y.o.   MRN: [id number] Abdomen soft and PEG in place. TF going. Stable S/P PEG. Continue binder at all times.  Violeta Gelinas, MD, MPH, FACS Trauma: [phone] General Surgery: [phone]

## 2014-03-03 NOTE — Progress Notes (Signed)
Patient ID: Lynn Gentry, female   DOB: 1971/02/13, 43 y.o.   MRN: [id number]  [facility] Progress Note    Subjective:   Intubated unresponsive   Objective:   BP 142/73  Pulse 85  Temp(Src) 100.3 F (37.9 C) (Axillary)  Resp 27  Ht 5\' 4"  (1.626 m)  Wt 114.9 kg (253 lb 4.9 oz)  BMI 43.46 kg/m2  SpO2 100%  Intake/Output: I/O last 3 completed shifts: In: 1356.6 [I.V.:596.9; Other:220; NG/GT:439.7; IV Piggyback:100] Out: 118 [Urine:118]   Intake/Output this shift:  Total I/O In: 670 [Other:120; NG/GT:50; IV Piggyback:500] Out: -  Weight change: -6.5 kg (-14 lb 5.3 oz)  Physical Exam: WUJ:WJXBJGen:obese AAF intubated and unresponsive CVS:no rub Resp:occ rhonchi YNW:GNFAOAbd:obese Ext:+edema  Labs: BMET  Recent Labs Lab 02/25/14 0433 02/26/14 0400 02/27/14 0415 02/28/14 0350 03/01/14 0500 03/02/14 0500 03/03/14 0500  NA 139 139 138 139 139 141 138  K 3.7 3.7 4.2 3.8 3.6* 4.2 4.5  CL 99 102 98 97 98 100 96  CO2 21 20 20 21 20 23 23   GLUCOSE 135* 129* 129* 139* 135* 128* 117*  BUN 69* 59* 96* 83* 124* 77* 102*  CREATININE 5.72* 4.18* 3.93* 4.81* 6.58* 4.63* 6.56*  ALBUMIN 2.8* 2.4*  --   --   --   --   --   CALCIUM 8.7 7.9* 9.1 9.2 9.2 8.9 9.1  PHOS 6.3* 5.0* 7.0* 5.4* 5.6* 3.8 7.0*   CBC  Recent Labs Lab 02/28/14 0350 03/01/14 0500 03/02/14 0500 03/03/14 0500  WBC 16.7* 13.3* 14.2* 15.1*  HGB 8.4* 8.0* 8.3* 8.3*  HCT 26.4* 24.7* 26.1* 26.7*  MCV 84.6 85.8 87.0 90.5  PLT 249 268 293 281    @IMGRELPRIORS @ Medications:    . amLODipine  10 mg Oral Daily  . antiseptic oral rinse  7 mL Mouth Rinse QID  . chlorhexidine  15 mL Mouth Rinse BID  . [START ON 03/06/2014] cloNIDine  0.3 mg Transdermal Q Mon  . darbepoetin (ARANESP) injection - DIALYSIS  100 mcg Intravenous Q Mon-HD  . feeding supplement (PRO-STAT SUGAR FREE 64)  60 mL Per Tube BID  . feeding supplement (VITAL HIGH PROTEIN)  1,000 mL Per Tube Q24H  . fluconazole (DIFLUCAN) IV  200 mg  Intravenous Q24H  . heparin subcutaneous  5,000 Units Subcutaneous 3 times per day  . hydrALAZINE  50 mg Oral 4 times per day  . insulin aspart  0-15 Units Subcutaneous 6 times per day  . metoprolol tartrate  50 mg Per Tube BID  . multivitamin  5 mL Per Tube Daily  . pantoprazole sodium  40 mg Per Tube Q1200  . piperacillin-tazobactam (ZOSYN)  IV  2.25 g Intravenous 3 times per day  . propofol  5-80 mcg/kg/min Intravenous Once  . senna-docusate  1 tablet Per Tube BID     Assessment/Plan:  1. AKI vs. Subacute vs. AKI/CKD- oliguric requiring dialysis. S/p 5 HD treatments with marked improvement of volume and BP  1. Plan for HD today for more UF  2. Remains oliguric and dialysis-dependent 3. Will need tunneled HD cath early next week.  Would d/c HD catheter tomorrow given fevers and replace on Monday  2. Vascular access- has temp HD cath in place and will need tunneled HD catheter next week.  Also with low grade fever and will need to d/c lines pan culture and follow. 3. R BG hemorrhagic stroke- neurosurgery following  1. Stable by repeat CT scan 02/28/14 4. VDRF- per PCCM  5. Malignant HTN and volume overload- improving with aggressive HD and UF.  1. stopped lasix and she remains oliguric and remains dialysis-dependent.  2. bp much better controlled. 6. Metabolic acidosis- improved with HD 7. ABLA- transfuse prn per PCCM 8. DM- per primary svc 9. Obesity- 10. Protein malnutrition- for PEG today 11. dispo- unclear prognosis, hopefully she will start to exhibit some neurologic improvement as well as renal. Cont with supportive measures for now, however if she requires a trach, her only option for dialysis would be LTC facility. 12.   Lynn Gentry A 03/03/2014, 1:26 PM

## 2014-03-03 NOTE — Progress Notes (Signed)
NUTRITION FOLLOW-UP  DOCUMENTATION CODES Per approved criteria  -Morbid Obesity   INTERVENTION: Vital High Protein to 40 ml/hr via OG tube   60 ml Prostat BID.    MVI daily  Tube feeding regimen provides 1360 kcal (67% of needs), 144 grams of protein, and 802 ml of H2O.    NUTRITION DIAGNOSIS: Inadequate oral intake related to inability to eat as evidenced by NPO status; ongoing.   Goal: Enteral nutrition to provide 60-70% of estimated calorie needs (22-25 kcals/kg ideal body weight) and 100% of estimated protein needs, based on ASPEN guidelines for permissive underfeeding in critically ill obese individuals; met.   Monitor:  Respiratory status, HD, TF tolerance, labs   ASSESSMENT: Pt admitted 10/6 with left sided weakness, slurred speech, inability to stand up. In ED, found to have acute lateral right basal ganglia ICH with mild mass effect and 44m MLS. Pt developed respiratory failure and was intubated.   Patient is currently intubated on ventilator support MV: 12 L/min Temp (24hrs), Avg:99.1 F (37.3 C), Min:98 F (36.7 C), Max:100.3 F (37.9 C)   Pt had HD catheter placed 10/8 and started HD. Pt with volume overload continues to require HD. Per MD will need tunneled catheter.   Phosphorus elevated.  Current TF regimen provides 768 mg of Phosphorus. Does not appear to be a need to transition to Nepro. If change needed would be Nepro @10  ml/hr with 60 ml Prostat QID.   Height: Ht Readings from Last 1 Encounters:  02/21/14 5' 4"  (1.626 m)    Weight: Wt Readings from Last 1 Encounters:  03/03/14 253 lb 4.9 oz (114.9 kg)  Admission weight: 276 lb (125.5 kg) 10/6  BMI:  Body mass index is 43.46 kg/(m^2).  Estimated Nutritional Needs: Kcal: 2171 Protein: >/= 136 grams Fluid: > 2 L/day  Skin: intact  Diet Order: NPO   Intake/Output Summary (Last 24 hours) at 03/03/14 1317 Last data filed at 03/03/14 1100  Gross per 24 hour  Intake 959.67 ml  Output      85 ml  Net 874.67 ml    Last BM: 10/14  Labs:   Recent Labs Lab 03/01/14 0500 03/02/14 0500 03/03/14 0500  NA 139 141 138  K 3.6* 4.2 4.5  CL 98 100 96  CO2 20 23 23   BUN 124* 77* 102*  CREATININE 6.58* 4.63* 6.56*  CALCIUM 9.2 8.9 9.1  MG 2.7* 2.3 2.5  PHOS 5.6* 3.8 7.0*  GLUCOSE 135* 128* 117*    CBG (last 3)   Recent Labs  03/03/14 0428 03/03/14 0809 03/03/14 1209  GLUCAP 118* 116* 148*    Scheduled Meds: . amLODipine  10 mg Oral Daily  . antiseptic oral rinse  7 mL Mouth Rinse QID  . chlorhexidine  15 mL Mouth Rinse BID  . [START ON 03/06/2014] cloNIDine  0.3 mg Transdermal Q Mon  . darbepoetin (ARANESP) injection - DIALYSIS  100 mcg Intravenous Q Mon-HD  . feeding supplement (PRO-STAT SUGAR FREE 64)  60 mL Per Tube BID  . feeding supplement (VITAL HIGH PROTEIN)  1,000 mL Per Tube Q24H  . fluconazole (DIFLUCAN) IV  200 mg Intravenous Q24H  . heparin subcutaneous  5,000 Units Subcutaneous 3 times per day  . hydrALAZINE  50 mg Oral 4 times per day  . insulin aspart  0-15 Units Subcutaneous 6 times per day  . metoprolol tartrate  50 mg Per Tube BID  . multivitamin  5 mL Per Tube Daily  . pantoprazole sodium  40 mg Per Tube Q1200  . piperacillin-tazobactam (ZOSYN)  IV  2.25 g Intravenous 3 times per day  . propofol  5-80 mcg/kg/min Intravenous Once  . senna-docusate  1 tablet Per Tube BID    Continuous Infusions: . fentaNYL infusion INTRAVENOUS Stopped (02/27/14 0845)  . niCARDipine Stopped (03/02/14 1200)    [place], [place], [place] Pager [phone] After Hours Pager

## 2014-03-04 LAB — CBC
HCT: 24.1 % — ABNORMAL LOW (ref 36.0–46.0)
Hemoglobin: 7.6 g/dL — ABNORMAL LOW (ref 12.0–15.0)
MCH: 27.8 pg (ref 26.0–34.0)
MCHC: 31.5 g/dL (ref 30.0–36.0)
MCV: 88.3 fL (ref 78.0–100.0)
PLATELETS: 242 10*3/uL (ref 150–400)
RBC: 2.73 MIL/uL — ABNORMAL LOW (ref 3.87–5.11)
RDW: 16.9 % — ABNORMAL HIGH (ref 11.5–15.5)
WBC: 14.8 10*3/uL — ABNORMAL HIGH (ref 4.0–10.5)

## 2014-03-04 LAB — GLUCOSE, CAPILLARY
GLUCOSE-CAPILLARY: 117 mg/dL — AB (ref 70–99)
Glucose-Capillary: 119 mg/dL — ABNORMAL HIGH (ref 70–99)
Glucose-Capillary: 131 mg/dL — ABNORMAL HIGH (ref 70–99)
Glucose-Capillary: 132 mg/dL — ABNORMAL HIGH (ref 70–99)
Glucose-Capillary: 146 mg/dL — ABNORMAL HIGH (ref 70–99)
Glucose-Capillary: 148 mg/dL — ABNORMAL HIGH (ref 70–99)

## 2014-03-04 LAB — BASIC METABOLIC PANEL
ANION GAP: 18 — AB (ref 5–15)
BUN: 92 mg/dL — ABNORMAL HIGH (ref 6–23)
CO2: 23 mEq/L (ref 19–32)
Calcium: 8.5 mg/dL (ref 8.4–10.5)
Chloride: 97 mEq/L (ref 96–112)
Creatinine, Ser: 5.99 mg/dL — ABNORMAL HIGH (ref 0.50–1.10)
GFR calc Af Amer: 9 mL/min — ABNORMAL LOW (ref >90)
GFR, EST NON AFRICAN AMERICAN: 8 mL/min — AB (ref >90)
GLUCOSE: 136 mg/dL — AB (ref 70–99)
Potassium: 3.9 mEq/L (ref 3.7–5.3)
Sodium: 138 mEq/L (ref 137–147)

## 2014-03-04 LAB — MAGNESIUM: MAGNESIUM: 2.4 mg/dL (ref 1.5–2.5)

## 2014-03-04 LAB — PHOSPHORUS: Phosphorus: 6.1 mg/dL — ABNORMAL HIGH (ref 2.3–4.6)

## 2014-03-04 MED ORDER — HYDRALAZINE HCL 50 MG PO TABS
50.0000 mg | ORAL_TABLET | Freq: Four times a day (QID) | ORAL | Status: DC
  Administered 2014-03-04 – 2014-03-09 (×15): 50 mg
  Filled 2014-03-04 (×24): qty 1

## 2014-03-04 MED ORDER — CEFAZOLIN SODIUM-DEXTROSE 2-3 GM-% IV SOLR
2.0000 g | INTRAVENOUS | Status: AC
  Administered 2014-03-06: 2 g via INTRAVENOUS
  Filled 2014-03-04: qty 50

## 2014-03-04 MED ORDER — VANCOMYCIN HCL IN DEXTROSE 1-5 GM/200ML-% IV SOLN
1000.0000 mg | Freq: Once | INTRAVENOUS | Status: AC
  Administered 2014-03-04: 1000 mg via INTRAVENOUS
  Filled 2014-03-04: qty 200

## 2014-03-04 MED ORDER — SODIUM CHLORIDE 0.9 % IV SOLN
0.0000 ug/h | INTRAVENOUS | Status: AC
  Administered 2014-03-04: 25 ug/h via INTRAVENOUS
  Filled 2014-03-04: qty 50

## 2014-03-04 MED ORDER — FENTANYL 100 MCG/HR TD PT72
100.0000 ug | MEDICATED_PATCH | TRANSDERMAL | Status: DC
  Administered 2014-03-04 – 2014-03-09 (×3): 100 ug via TRANSDERMAL
  Filled 2014-03-04 (×3): qty 1

## 2014-03-04 NOTE — Progress Notes (Signed)
PULMONARY / CRITICAL CARE MEDICINE   Name: Lynn NationsStephanie Gentry MRN: [id number] DOB: 15-Dec-1970    ADMISSION DATE:  02/21/2014 CONSULTATION DATE:  03/04/2014  REFERRING MD :  Pearlean BrownieSethi  CHIEF COMPLAINT:  Respiratory failure in setting of ICH  INITIAL PRESENTATION: 43 y.o. F [facility] with left sided weakness, slurred speech, inability to stand up.  In ED, found to have acute lateral right basal ganglia ICH with mild mass effect and 5mm MLS.  Admitted to neuro ICU and developed acute respiratory failure, failed bipap and PCCM called for intubation and management. She was admitted to neuro ICU and later that evening had increasing O2 demands.  She had no improvement in SpO2, work of breathing, and mental status despite 3 hours of BiPAP.  PCCM was consulted for intubation.  STUDIES:  10/06 CT head:  acute hemorrhage into the lateral right basal ganglia, likely indicating a hypertensive hemorrhage/hemorrhagic stroke.  Mild mass effect with 5mm MLS to the left. 10/07 Renal US:  echogenic kidneys s/w ckd, no hydro.  [facility] 10[facility]07 Doppler carotid: consistent with 1-39% stenosis involving the R internal carotid art and the L internal carotid art 10/07 TTE: LVEF 60-65%. Severe concentric LVH. LA dilatation. PASP est 43 mmHg 10[facility] LE venous Dopplers (ordered by Stroke) >>  10/15 Trach (JY).  SIGNIFICANT EVENTS: 10/6 - admitted for ICH, developed respiratory failure requiring intubation. 10/07 Persistent hypertension. Remained on nicardipine gtt. Persistent volume overload snd pulm edema pattern on CXR. Lasix gtt resulted in minimal increase in Uo 10/08 Worsening renal function, oliguria. Renal consult. HD performed 10/09 Acute change in neuro status. STAT CT [facility]09 Further HD planned 10/14 HD with negative fluid balance 10/16 Febrile and purulent matter from nasal cavity and trach.  VITAL SIGNS: Temp:  [98.8 F  (37.1 C)-102.1 F (38.9 C)] 99.8 F (37.7 C) (10/17 1600) Pulse Rate:  [65-112] 82 (10/17 1900) Resp:  [18-34] 27 (10/17 1900) BP: (91-156)/(42-86) 115/66 mmHg (10/17 1900) SpO2:  [97 %-100 %] 99 % (10/17 1900) FiO2 (%):  [30 %-40 %] 30 % (10/17 1659)  HEMODYNAMICS:    VENTILATOR SETTINGS: Vent Mode:  [-] PSV;CPAP FiO2 (%):  [30 %-40 %] 30 % Set Rate:  [20 bmp] 20 bmp PEEP:  [5 cmH20] 5 cmH20 Pressure Support:  [8 cmH20] 8 cmH20  INTAKE / OUTPUT: Intake/Output     10/17 0701 - 10/18 0700   I.V. (mL/kg) 7.5 (0.1)   Other    NG/GT 440   IV Piggyback 300   Total Intake(mL/kg) 747.5 (6.8)   Urine (mL/kg/hr) 100 (0.1)   Other    Total Output 100   Net +647.5        PHYSICAL EXAMINATION: General: RASS -3,, not following commands Neuro:  Localizes, RASS -3, not F/C HEENT: [place]/AT. PERRL Cardiovascular: RRR, no M  Lungs: No wheezes Abdomen: Obese, soft, +BS Ext: symmetric mild diffuse edema  LABS:  I have reviewed all of today's lab results. Relevant abnormalities are discussed in the A/P section  CXR: NNF  ASSESSMENT / PLAN:  PULMONARY ETT 10/6 >> 10/15 Trach tube 10/15 >>  A: Acute hypoxemic respiratory failure Acute pulmonary edema  - cardiogenic P:   Cont vent support - settings reviewed and/or adjusted Wean in PSV as tolerated Cont vent bundle Daily SBT if/when meets criteria   CARDIOVASCULAR CVL R IJ 10/6 >>  A:  HTN crisis, resolved Severe pulmonary  edema, improved Acute systolic v diastolic chf Elevated troponin (pk 4.24 10/07) P:  Continue lopressor 50 mg BID with holding parameters Continue norvasc 10 mg daily with holding parameters Continue Catapres TTS 3 Continue hydralazine  50 q6 PRN hydralazine ordered 10/09 PRN metoprolol ordered 10/09  RENAL L IJ HD cath 10/08 >>  A:   Acute renal failure, oliguric Suspected CKD - likely hypertensive volume overload, improved P:   Monitor BMET intermittently Monitor I/Os Correct  electrolytes as indicated HD per renal  GASTROINTESTINAL A:   Obesity P:   SUP: Pantoprazole Cont TFs  HEMATOLOGIC A:   ICU acquired anemia P:  DVT px: SQ heparin Monitor CBC intermittently Transfuse per usual ICU guidelines  INFECTIOUS A:   Fever, purulent ET secretions P:   Sputum culture 10/17 >>  Blood culture 10/16 >>  Fluconazole (continue until tracheal aspirate results, if negative then D/C) 10/15 >> 10/17 Vancomycin 10/16 >>  Zosyn 10/16 >>   ENDOCRINE A:   Hyperglycemia, controlled P:   CBG's q4hr SSI  NEUROLOGIC A:   Hypertensive R basal ganglion hemorrhage  Acute encephalopathy P:   Mgmt per Stroke team Transition form fent gtt to patch Cont PRN fentanyl  RASS goal: 0 Daily WUA.  FAMILY: Mother updated bedside.  TODAY'S SUMMARY:   35 mins CCM time  *Care during the described time interval was provided by me and/or other providers on the critical care team. I have reviewed this patient's available data, including medical history, events of note, physical examination and test results as part of my evaluation.  Billy Fischer, MD ; [facility] [phone].  After 5:30 PM or weekends, call [phone]

## 2014-03-04 NOTE — Progress Notes (Signed)
Patient ID: Lynn Gentry, female   DOB: [birth year], 43 y.o.   MRN: [id number]  [facility] Progress Note    Subjective:   Awake, s/p trach, does not follow commands, nonverbal   Objective:   BP 135/70  Pulse 95  Temp(Src) 99.3 F (37.4 C) (Oral)  Resp 30  Ht 5\' 4"  (1.626 m)  Wt 110.3 kg (243 lb 2.7 oz)  BMI 41.72 kg/m2  SpO2 99%  Intake/Output: I/O last 3 completed shifts: In: 1727.2 [I.V.:57.5; Other:200; NG/GT:719.7; IV Piggyback:750] Out: 4877 [Urine:75; Other:4802]   Intake/Output this shift:  Total I/O In: 137.5 [I.V.:7.5; NG/GT:80; IV Piggyback:50] Out: -  Weight change: 0.2 kg (7.1 oz)  Physical Exam: RAX:ENMMH AAF s/p trach CVS:no rub Resp:decreased BS WKG:SUPJS  Ext:+edema  Labs: BMET  Recent Labs Lab 02/26/14 0400 02/27/14 0415 02/28/14 0350 03/01/14 0500 03/02/14 0500 03/03/14 0500 03/04/14 0319  NA 139 138 139 139 141 138 138  K 3.7 4.2 3.8 3.6* 4.2 4.5 3.9  CL 102 98 97 98 100 96 97  CO2 20 20 21 20 23 23 23   GLUCOSE 129* 129* 139* 135* 128* 117* 136*  BUN 59* 96* 83* 124* 77* 102* 92*  CREATININE 4.18* 3.93* 4.81* 6.58* 4.63* 6.56* 5.99*  ALBUMIN 2.4*  --   --   --   --   --   --   CALCIUM 7.9* 9.1 9.2 9.2 8.9 9.1 8.5  PHOS 5.0* 7.0* 5.4* 5.6* 3.8 7.0* 6.1*   CBC  Recent Labs Lab 03/01/14 0500 03/02/14 0500 03/03/14 0500 03/04/14 0319  WBC 13.3* 14.2* 15.1* 14.8*  HGB 8.0* 8.3* 8.3* 7.6*  HCT 24.7* 26.1* 26.7* 24.1*  MCV 85.8 87.0 90.5 88.3  PLT 268 293 281 242    @IMGRELPRIORS @ Medications:    . amLODipine  10 mg Oral Daily  . antiseptic oral rinse  7 mL Mouth Rinse QID  . chlorhexidine  15 mL Mouth Rinse BID  . [START ON 03/06/2014] cloNIDine  0.3 mg Transdermal Q Mon  . darbepoetin (ARANESP) injection - DIALYSIS  100 mcg Intravenous Q Mon-HD  . feeding supplement (PRO-STAT SUGAR FREE 64)  60 mL Per Tube BID  . feeding supplement (VITAL HIGH PROTEIN)  1,000 mL Per Tube Q24H  . fentaNYL  100 mcg  Transdermal Q72H  . heparin subcutaneous  5,000 Units Subcutaneous 3 times per day  . [START ON 03/07/2014] heparin subcutaneous  5,000 Units Subcutaneous 3 times per day  . hydrALAZINE  50 mg Per Tube 4 times per day  . insulin aspart  0-15 Units Subcutaneous 6 times per day  . metoprolol tartrate  50 mg Per Tube BID  . multivitamin  5 mL Per Tube Daily  . pantoprazole sodium  40 mg Per Tube Q1200  . piperacillin-tazobactam (ZOSYN)  IV  2.25 g Intravenous 3 times per day  . senna-docusate  1 tablet Per Tube BID     Assessment/Plan:  1. AKI vs. Subacute vs. AKI/CKD- oliguric requiring dialysis. S/p 5 HD treatments with marked improvement of volume and BP  1. Cont with HD qMWF 2. Remains oliguric and dialysis-dependent 3. Will need tunneled HD cath early next week.  2. Fevers- per Dr. Sung Amabile, believes this to be related to respiratory infection and will check CT scan.  On abx. 3. Vascular access- has temp HD cath in place and will need tunneled HD catheter next week. Also with low grade fever and will need to d/c lines pan culture and follow. 1. Appreciate  [facility] and [facility] temp catheter 4. R BG hemorrhagic stroke- neurosurgery following  1. Stable by repeat CT scan 02/28/14 5. VDRF- per PCCM 6. Malignant HTN and volume overload- improving with aggressive HD and UF.  1. stopped lasix and she remains oliguric and remains dialysis-dependent.  2. bp much better controlled. 7. Metabolic acidosis- improved with HD 8. ABLA- transfuse prn per PCCM 9. DM- per primary svc 10. Obesity- 11. Protein malnutrition- for PEG today 12. dispo- unclear prognosis, hopefully she will start to exhibit some neurologic improvement as well as renal. Cont with supportive measures for now, however if she requires a trach, her only option for dialysis would be LTC facility. 13.   Ulrich Soules A 03/04/2014, 9:43 AM

## 2014-03-04 NOTE — Progress Notes (Signed)
STROKE TEAM PROGRESS NOTE   HISTORY Lynn Gentry is an 43 y.o. female with no previously documented medical disorder who was brought to [facility] with new onset slurred speech and left-sided weakness. Patient went to work as usual until 1500. She was found by her son at home at 1600 unable to get up out of a chair with left sided weakness, slurred speech. Patient was last known well around 3 PM this afternoon 02/21/2014. CT scan of her head showed an acute 4.1 x 1.5 cm right basal ganglia hemorrhage with mild mass effect and right to left shift of 5 mm. Patient's blood pressure was markedly elevated at 226/111. She was started on Cardene drip and blood pressure responded well. She has shown continual respiratory difficulty. She was initially given oxygen by nasal cannula followed by NBR, and subsequently placed on BiPAP. She continued to have oxygen saturations only in the high 80s to low 90s on 100% O2, with a respiratory rates in the 40s. ABG showed pH of 2.82, PCO2 37 PO2 of 57. Chest x-ray showed mild cardiomegaly with possible asymmetric pulmonary edema. BNP was 36,455. Because of deteriorating respiratory status intubation and mechanical ventilation was elected. CCM service was consulted. Patient was not administered TPA secondary to ICH. She was admitted to the neuro ICU  for further evaluation and treatment.   SUBJECTIVE (INTERVAL HISTORY) Pt still on vent with trach. She has no acute neuro changes overnight. Same responsiveness to pain stimulation today with more withdraw on the right. off nicardipene drip now. Not able to have MRI done due to not able to lie flat secondary to severe pulmonary edema.   OBJECTIVE Temp:  [98.8 F (37.1 C)-102.1 F (38.9 C)] 98.8 F (37.1 C) (10/17 1200) Pulse Rate:  [65-112] 81 (10/17 1500) Cardiac Rhythm:  [-] Normal sinus rhythm (10/16 2000) Resp:  [18-35] 30 (10/17 1500) BP: (91-156)/(42-86) 102/52 mmHg (10/17 1500) SpO2:  [95 %-100 %] 99 %  (10/17 1500) FiO2 (%):  [40 %] 40 % (10/17 1324) Weight:  [243 lb 2.7 oz (110.3 kg)] 243 lb 2.7 oz (110.3 kg) (10/16 1812)   Recent Labs Lab 03/03/14 2006 03/03/14 2353 03/04/14 0319 03/04/14 0824 03/04/14 1058  GLUCAP 137* 146* 132* 119* 148*    Recent Labs Lab 02/28/14 0350 03/01/14 0500 03/02/14 0500 03/03/14 0500 03/04/14 0319  NA 139 139 141 138 138  K 3.8 3.6* 4.2 4.5 3.9  CL 97 98 100 96 97  CO2 21 20 23 23 23   GLUCOSE 139* 135* 128* 117* 136*  BUN 83* 124* 77* 102* 92*  CREATININE 4.81* 6.58* 4.63* 6.56* 5.99*  CALCIUM 9.2 9.2 8.9 9.1 8.5  MG 2.4 2.7* 2.3 2.5 2.4  PHOS 5.4* 5.6* 3.8 7.0* 6.1*    Recent Labs Lab 02/26/14 0400  ALBUMIN 2.4*    Recent Labs Lab 02/28/14 0350 03/01/14 0500 03/02/14 0500 03/03/14 0500 03/04/14 0319  WBC 16.7* 13.3* 14.2* 15.1* 14.8*  HGB 8.4* 8.0* 8.3* 8.3* 7.6*  HCT 26.4* 24.7* 26.1* 26.7* 24.1*  MCV 84.6 85.8 87.0 90.5 88.3  PLT 249 268 293 281 242   No results found for this basename: CKTOTAL, CKMB, CKMBINDEX, TROPONINI,  in the last 168 hours  Recent Labs  03/02/14 0845  LABPROT 17.8*  INR 1.45   No results found for this basename: COLORURINE, APPERANCEUR, LABSPEC, PHURINE, GLUCOSEU, HGBUR, BILIRUBINUR, KETONESUR, PROTEINUR, UROBILINOGEN, NITRITE, LEUKOCYTESUR,  in the last 72 hours     Component Value Date/Time   CHOL 177  02/28/2014 0350   Lab Results  Component Value Date   HGBA1C 5.8* 02/23/2014   No results found for this basename: labopia,  cocainscrnur,  labbenz,  amphetmu,  thcu,  labbarb    No results found for this basename: ETH,  in the last 168 hours  Ct Head Wo Contrast 02/28/14 - Stable 4.2 x 1.1 cm right lenticular nucleus hematoma with mild  surrounding vasogenic edema. Local mass effect upon the right  lateral ventricle with slight bowing of the septum to the left by  3.9 mm without significant change.  This may represent result of hypertensive hemorrhage and can be  assessed as this  clears.  Prominent white matter type changes asymmetric greater on the left  more conspicuous on the present exam than on the prior exam. The  patient may benefit from followup MR and MR angiogram for further  delineation. This may be ischemic in origin possibly related to  watershed type infarct. Other causes not excluded although felt to  be less likely.  02/24/2014 Stable appearance of the hematoma within the right basal ganglia. No new or progressive findings.  02/22/2014   1. No significant interval change in the patient's intraparenchymal hemorrhage at the right lateral basal ganglia, measuring 4.1 x 1.2 cm, with mild surrounding vasogenic edema. Mild associated mass effect, with 4 mm of leftward midline shift again seen. 2. Scattered small vessel ischemic microangiopathy. 3. Bilateral proptosis noted.     02/21/2014   1. Acute hemorrhage into the lateral right basal ganglia, likely indicating a hypertensive hemorrhage/hemorrhagic stroke. 2. Mild mass effect with shift of the midline to the left approximating 5 mm. Critical   US Renal Port 02/22/2014    1. Small echogenic kidneys consistent with chronic renal medical disease. No hydronephrosis. 2. The urinary bladder is decompressed by Foley catheter and cannot be evaluated.     Dg Chest Port 1 View 02/22/2014    1. Satisfactory positioning of endotracheal tube and right IJ line. 2. Cardiomegaly with persistent diffuse interstitial and airspace disease likely reflecting edema and congestive heart failure. 3. Bilateral pleural effusions and basilar airspace disease. This likely reflects atelectasis.    02/21/2014    Mild cardiac enlargement and possible asymmetric pulmonary edema. However, exam is limited by body habitus.     2D echo - - Left ventricle: The cavity size was normal. There was severe concentric hypertrophy. Systolic function was normal. The estimated ejection fraction was in the range of 60% to 65%. Wall motion was normal; there  were no regional wall motion abnormalities. - Left atrium: The atrium was mildly dilated. - Pulmonary arteries: PA peak pressure: 43 mm Hg (S). - Pericardium, extracardiac: A small, free-flowing pericardial effusion was identified posterior to the heart and circumferential to the heart. The fluid had no internal echoes.There was no evidence of hemodynamic compromise. Impressions: - The right ventricular systolic pressure was increased consistent with moderate pulmonary hypertension.  CUS - - Findings consistent with 1-39 percent stenosis involving the right internal carotid artery and the left internal carotid artery. - Right vertebral artery not imaged due to central line dressing. Left vertebral artery not imaged due to patient position.  DVT - - No obvious evidence of deep vein or superficial thrombosis involving the right lower extremity and left lower extremity. - No evidence of Baker&'s cyst on the right or left.  EEG 02/28/14 - This EEG is abnormal with severe generalized slowing of cerebral activity consistent with severe encephalopathic state. These findings are  nonspecific and can be seen with metabolic as well as toxic and degenerative encephalopathies. No evidence of an epileptic disorder was demonstrated.  A1C 5.8 and LDL 92  PHYSICAL EXAM Young obese Caucasian lady not in distress. On trach with ventilation. Afebrile. Head is nontraumatic. Neck is supple without bruit. Cardiac exam soft ejection murmur RRR .no gallop. Lungs bilateral crackles and  rhonchi to auscultation. Distal pulses are well felt.   Neurological Exam: on trach now with ventilation, eyes closed and not following commands. Slight downgaze preference. Eyes conjugate. Pupils 3 mm reactive. Fundi not visualized. Left lower face weakness, corneal and gag present. On pain stimulation, she had bilateral trace withdraw to pain, more at RUE and RLE and LLE and less on LUE. Left Plantar upgoing and right down  going.  ASSESSMENT/PLAN Ms. Kathlee NationsStephanie Varady is a 43 y.o. female with no documented medical history found with new onset slurred speech and left-sided weakness. CT showed right external capsule hemorrhage. However, her condition getting worse with pulmonary edema, AKI requiring HD, respiratory failure needing ventilation. Transferred pt to CCM for better management.  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, nondominant side with cytotoxic edema, mild transfalcine herniation, pulmonary edema, respiratory and renal failure Repeat CT showed left subcortical hypodensity likely ischemic etiology  CT head 10/9 showed stable hematoma, will repeat CT head today  CT head repeat 10/13 showed stable hematoma on the right but more clear left MCA ischemic changes  Recommend to have MRI and MRA done if pt able to lie flat to evaluate left MCA stroke.  EEG showed severe slowing but no seizure.  2D echo unremarkable  Carotid doppler unremarkable  HgbA1c 5.8  Heparin subq for VTE prophylaxis  Resultant VDRF, left hemiparesis  no antithrombotics prior to admission  Ongoing aggressive risk factor management  Acute Respiratory Failure with severe acute pulmonary edema  trach on 03/02/14  CCM following  Diuresing, IV Lasix, Hemodialysis, urine output minimal today  CXR series  Malignant Hypertension  BP 226/111 at onset  Home meds:   none  SBP goal < 150 due to presence of end organ damage  On amlodipine, clonidine patch and metoprolol and hydralazine  off cardene now  Acute renal failure with volume overload, suspect baseline chronic kidney disease  Diuresis and management per critical care, on HD  Continue to trend Cre.  HD today  Other Stroke Risk Factors ETOH use   Morbid Obesity, Body mass index is 41.72 kg/(m^2).   Hospital day # 11 03/04/2014 4:34 PM  This patient is critically ill due to cerebral hemorrhage, respiratory  failure, renal failure and at significant risk of neurological worsening, death form hematoma expansion, cerebral edema, brain herniation. This patient's care requires constant monitoring of vital signs, hemodynamics, respiratory and cardiac monitoring, review of multiple databases, neurological assessment, discussion with family, other specialists and medical decision making of high complexity. I spent 35 minutes of neurocritical care time in the care of this patient.  Marvel PlanJindong Shakira Los, MD PhD Stroke Neurology 03/04/2014 4:34 PM   To contact Stroke Continuity provider, please refer to [email or web address]. After hours, contact General Neurology

## 2014-03-04 NOTE — Progress Notes (Signed)
ANTIBIOTIC CONSULT NOTE - FOLLOW UP  Pharmacy Consult for Vancomycin + Zosyn Indication: rule out pneumonia  No Known Allergies  Patient Measurements: Height: 5\' 4"  (162.6 cm) Weight: 243 lb 2.7 oz (110.3 kg) IBW/kg (Calculated) : 54.7  Vital Signs: Temp: 99.3 F (37.4 C) (10/17 0800) Temp Source: Oral (10/17 0800) BP: 107/54 mmHg (10/17 1324) Pulse Rate: 85 (10/17 1300) Intake/Output from previous day: 10/16 0701 - 10/17 0700 In: 1637.5 [I.V.:57.5; NG/GT:710; IV Piggyback:750] Out: 4802  Intake/Output from this shift: Total I/O In: 297.5 [I.V.:7.5; NG/GT:240; IV Piggyback:50] Out: -   Labs:  Recent Labs  03/02/14 0500 03/03/14 0500 03/04/14 0319  WBC 14.2* 15.1* 14.8*  HGB 8.3* 8.3* 7.6*  PLT 293 281 242  CREATININE 4.63* 6.56* 5.99*   Estimated Creatinine Clearance: 14.7 ml/min (by C-G formula based on Cr of 5.99). No results found for this basename: VANCOTROUGH, Leodis Binet, VANCORANDOM, GENTTROUGH, GENTPEAK, GENTRANDOM, TOBRATROUGH, TOBRAPEAK, TOBRARND, AMIKACINPEAK, AMIKACINTROU, AMIKACIN,  in the last 72 hours   Microbiology: Recent Results (from the past 720 hour(s))  MRSA PCR SCREENING     Status: Abnormal   Collection Time    02/21/14  9:37 PM      Result Value Ref Range Status   MRSA by PCR POSITIVE (*) NEGATIVE Final   Comment:            The GeneXpert MRSA Assay (FDA     approved for NASAL specimens     only), is one component of a     comprehensive MRSA colonization     surveillance program. It is not     intended to diagnose MRSA     infection nor to guide or     monitor treatment for     MRSA infections.     RESULT CALLED TO, READ BACK BY AND VERIFIED WITH:     M.BOULDEN,RN 0154 02/22/14 M.CAMPBELL    Anti-infectives   Start     Dose/Rate Route Frequency Ordered Stop   03/06/14 0600  ceFAZolin (ANCEF) IVPB 2 g/50 mL premix    Comments:  hang ON CALL to xray 10/19   2 g 100 mL/hr over 30 Minutes Intravenous On call 03/04/14 1117 03/07/14  0600   03/04/14 1600  vancomycin (VANCOCIN) IVPB 1000 mg/200 mL premix     1,000 mg 200 mL/hr over 60 Minutes Intravenous  Once 03/04/14 1502     03/03/14 1000  piperacillin-tazobactam (ZOSYN) IVPB 2.25 g     2.25 g 100 mL/hr over 30 Minutes Intravenous 3 times per day 03/03/14 0917     03/03/14 1000  vancomycin (VANCOCIN) 1,500 mg in sodium chloride 0.9 % 500 mL IVPB     1,500 mg 250 mL/hr over 120 Minutes Intravenous  Once 03/03/14 0918 03/03/14 1213   03/02/14 1300  fluconazole (DIFLUCAN) IVPB 200 mg  Status:  Discontinued     200 mg 100 mL/hr over 60 Minutes Intravenous Every 24 hours 03/02/14 1205 03/04/14 0932      Assessment: 43 YOF with no significant past medial history admitted on 10/8 for hypertensive emergency and intracerebral hemorrhage. The patient remains intubated and continues on Vancomycin + Zosyn for r/o PNA in the setting of purulent trach drainage. The patient was given a low loading dose of Vanc 1.5g on 10/16 and then tolerated HD for 4 hr at a BFR 200 - will given an additional Vancomycin dose today to make up for what was removed with HD and complete load. Renal planning to attempt MWF HD  schedule.   Goal of Therapy:  Pre-HD Vancomycin level of 15-25 mcg/ml  Plan:  1. Vancomycin 1000 mg x 1 dose today 2. Continue Zosyn 2.25g IV every 8 hours 3. Will continue to follow HD schedule/duration, culture results, LOT, and antibiotic de-escalation plans   Georgina PillionElizabeth Emmelia Holdsworth, PharmD, BCPS Clinical Pharmacist Pager: [phone] 03/04/2014 3:05 PM

## 2014-03-04 NOTE — Progress Notes (Signed)
RT obtained sputum sample (thin, tan, pink tinged) and sent to lab.

## 2014-03-05 ENCOUNTER — Inpatient Hospital Stay (HOSPITAL_COMMUNITY): Payer: 59

## 2014-03-05 LAB — GLUCOSE, CAPILLARY
GLUCOSE-CAPILLARY: 126 mg/dL — AB (ref 70–99)
GLUCOSE-CAPILLARY: 130 mg/dL — AB (ref 70–99)
Glucose-Capillary: 106 mg/dL — ABNORMAL HIGH (ref 70–99)
Glucose-Capillary: 118 mg/dL — ABNORMAL HIGH (ref 70–99)
Glucose-Capillary: 115 mg/dL — ABNORMAL HIGH (ref 70–99)
Glucose-Capillary: 132 mg/dL — ABNORMAL HIGH (ref 70–99)

## 2014-03-05 MED ORDER — AMLODIPINE BESYLATE 10 MG PO TABS
10.0000 mg | ORAL_TABLET | Freq: Every day | ORAL | Status: DC
  Administered 2014-03-06 – 2014-03-12 (×7): 10 mg
  Filled 2014-03-05 (×9): qty 1

## 2014-03-05 MED ORDER — FUROSEMIDE 10 MG/ML IJ SOLN
80.0000 mg | Freq: Once | INTRAMUSCULAR | Status: AC
  Administered 2014-03-05: 80 mg via INTRAVENOUS
  Filled 2014-03-05 (×2): qty 8

## 2014-03-05 MED ORDER — VANCOMYCIN HCL IN DEXTROSE 1-5 GM/200ML-% IV SOLN
1000.0000 mg | INTRAVENOUS | Status: DC
Start: 2014-03-06 — End: 2014-03-06
  Filled 2014-03-05: qty 200

## 2014-03-05 MED ORDER — IPRATROPIUM-ALBUTEROL 0.5-2.5 (3) MG/3ML IN SOLN: 3.0000 mL | RESPIRATORY_TRACT | Status: DC | PRN

## 2014-03-05 NOTE — Progress Notes (Signed)
E-Link notified of suspected flash pulmonary edema a.e.b. Pink frothy secretions . New orders received.

## 2014-03-05 NOTE — Progress Notes (Signed)
STROKE TEAM PROGRESS NOTE   HISTORY Lynn Gentry is an 43 y.o. female with no previously documented medical disorder who was brought to [facility] with new onset slurred speech and left-sided weakness. Patient went to work as usual until 1500. She was found by her son at home at 1600 unable to get up out of a chair with left sided weakness, slurred speech. Patient was last known well around 3 PM this afternoon 02/21/2014. CT scan of her head showed an acute 4.1 x 1.5 cm right basal ganglia hemorrhage with mild mass effect and right to left shift of 5 mm. Patient's blood pressure was markedly elevated at 226/111. She was started on Cardene drip and blood pressure responded well. She has shown continual respiratory difficulty. She was initially given oxygen by nasal cannula followed by NBR, and subsequently placed on BiPAP. She continued to have oxygen saturations only in the high 80s to low 90s on 100% O2, with a respiratory rates in the 40s. ABG showed pH of 2.82, PCO2 37 PO2 of 57. Chest x-ray showed mild cardiomegaly with possible asymmetric pulmonary edema. BNP was 36,455. Because of deteriorating respiratory status intubation and mechanical ventilation was elected. CCM service was consulted. Patient was not administered TPA secondary to ICH. She was admitted to the neuro ICU  for further evaluation and treatment.   SUBJECTIVE (INTERVAL HISTORY) Pt more responsive today, open eyes on voice although not tracking and not following any commands. Withdraw to LLE more than other limbs on pain stimulation. As per RN, pt still has a lot of secretions and not able to lie flat for the MRI yet.  OBJECTIVE Temp:  [98.5 F (36.9 C)-99.8 F (37.7 C)] 98.5 F (36.9 C) (10/18 1200) Pulse Rate:  [64-94] 78 (10/18 1500) Cardiac Rhythm:  [-] Normal sinus rhythm (10/17 2000) Resp:  [15-30] 24 (10/18 1500) BP: (97-134)/(55-75) 134/65 mmHg (10/18 1500) SpO2:  [98 %-100 %] 100 % (10/18 1500) FiO2 (%):   [30 %-40 %] 40 % (10/18 1138) Weight:  [244 lb 7.8 oz (110.9 kg)] 244 lb 7.8 oz (110.9 kg) (10/18 0500)   Recent Labs Lab 03/04/14 2008 03/05/14 0026 03/05/14 0416 03/05/14 0823 03/05/14 1141  GLUCAP 117* 130* 106* 118* 126*    Recent Labs Lab 02/28/14 0350 03/01/14 0500 03/02/14 0500 03/03/14 0500 03/04/14 0319  NA 139 139 141 138 138  K 3.8 3.6* 4.2 4.5 3.9  CL 97 98 100 96 97  CO2 GLUCOSE 139* 135* 128* 117* 136*  BUN 83* 124* 77* 102* 92*  CREATININE 4.81* 6.58* 4.63* 6.56* 5.99*  CALCIUM 9.2 9.2 8.9 9.1 8.5  MG 2.4 2.7* 2.3 2.5 2.4  PHOS 5.4* 5.6* 3.8 7.0* 6.1*   No results found for this basename: AST, ALT, ALKPHOS, BILITOT, PROT, ALBUMIN,  in the last 168 hours  Recent Labs Lab 02/28/14 0350 03/01/14 0500 03/02/14 0500 03/03/14 0500 03/04/14 0319  WBC 16.7* 13.3* 14.2* 15.1* 14.8*  HGB 8.4* 8.0* 8.3* 8.3* 7.6*  HCT 26.4* 24.7* 26.1* 26.7* 24.1*  MCV 84.6 85.8 87.0 90.5 88.3  PLT 249 268 293 281 242   No results found for this basename: CKTOTAL, CKMB, CKMBINDEX, TROPONINI,  in the last 168 hours No results found for this basename: LABPROT, INR,  in the last 72 hours No results found for this basename: COLORURINE, APPERANCEUR, LABSPEC, PHURINE, GLUCOSEU, HGBUR, BILIRUBINUR, KETONESUR, PROTEINUR, UROBILINOGEN, NITRITE, LEUKOCYTESUR,  in the last 72 hours     Component  Value Date/Time   CHOL 177 02/28/2014 0350   Lab Results  Component Value Date   HGBA1C 5.8* 02/23/2014   No results found for this basename: labopia,  cocainscrnur,  labbenz,  amphetmu,  thcu,  labbarb    No results found for this basename: ETH,  in the last 168 hours  Ct Head Wo Contrast 02/28/14 - Stable 4.2 x 1.1 cm right lenticular nucleus hematoma with mild  surrounding vasogenic edema. Local mass effect upon the right  lateral ventricle with slight bowing of the septum to the left by  3.9 mm without significant change.  This may represent result of hypertensive  hemorrhage and can be  assessed as this clears.  Prominent white matter type changes asymmetric greater on the left  more conspicuous on the present exam than on the prior exam. The  patient may benefit from followup MR and MR angiogram for further  delineation. This may be ischemic in origin possibly related to  watershed type infarct. Other causes not excluded although felt to  be less likely.  02/24/2014 Stable appearance of the hematoma within the right basal ganglia. No new or progressive findings.  02/22/2014   1. No significant interval change in the patient's intraparenchymal hemorrhage at the right lateral basal ganglia, measuring 4.1 x 1.2 cm, with mild surrounding vasogenic edema. Mild associated mass effect, with 4 mm of leftward midline shift again seen. 2. Scattered small vessel ischemic microangiopathy. 3. Bilateral proptosis noted.     02/21/2014   1. Acute hemorrhage into the lateral right basal ganglia, likely indicating a hypertensive hemorrhage/hemorrhagic stroke. 2. Mild mass effect with shift of the midline to the left approximating 5 mm. Critical   US Renal Port 02/22/2014    1. Small echogenic kidneys consistent with chronic renal medical disease. No hydronephrosis. 2. The urinary bladder is decompressed by Foley catheter and cannot be evaluated.     Dg Chest Port 1 View 02/22/2014    1. Satisfactory positioning of endotracheal tube and right IJ line. 2. Cardiomegaly with persistent diffuse interstitial and airspace disease likely reflecting edema and congestive heart failure. 3. Bilateral pleural effusions and basilar airspace disease. This likely reflects atelectasis.    02/21/2014    Mild cardiac enlargement and possible asymmetric pulmonary edema. However, exam is limited by body habitus.     2D echo - - Left ventricle: The cavity size was normal. There was severe concentric hypertrophy. Systolic function was normal. The estimated ejection fraction was in the range of  60% to 65%. Wall motion was normal; there were no regional wall motion abnormalities. - Left atrium: The atrium was mildly dilated. - Pulmonary arteries: PA peak pressure: 43 mm Hg (S). - Pericardium, extracardiac: A small, free-flowing pericardial effusion was identified posterior to the heart and circumferential to the heart. The fluid had no internal echoes.There was no evidence of hemodynamic compromise. Impressions: - The right ventricular systolic pressure was increased consistent with moderate pulmonary hypertension.  CUS - - Findings consistent with 1-39 percent stenosis involving the right internal carotid artery and the left internal carotid artery. - Right vertebral artery not imaged due to central line dressing. Left vertebral artery not imaged due to patient position.  DVT - - No obvious evidence of deep vein or superficial thrombosis involving the right lower extremity and left lower extremity. - No evidence of Baker&'s cyst on the right or left.  EEG 02/28/14 - This EEG is abnormal with severe generalized slowing of cerebral activity consistent with  severe encephalopathic state. These findings are nonspecific and can be seen with metabolic as well as toxic and degenerative encephalopathies. No evidence of an epileptic disorder was demonstrated.  A1C 5.8 and LDL 92  PHYSICAL EXAM Young obese Caucasian lady not in distress. On trach with ventilation. Afebrile. Head is nontraumatic. Neck is supple without bruit. Cardiac exam soft ejection murmur RRR .no gallop. Lungs bilateral crackles and  rhonchi to auscultation. Distal pulses are well felt.   Neurological Exam: on trach now with ventilation, eyes open on voice but not tracking and not following commands. Eyes in the middle. Pupils 3 mm reactive. Fundi not visualized. Left lower face weakness, corneal and gag present. On pain stimulation, she had trace withdraw to pain on RUE and RLE and LUE and more on LLE. Left Plantar  upgoing and right down going.  ASSESSMENT/PLAN Ms. Kathlee NationsStephanie Bublitz is a 43 y.o. female with no documented medical history found with new onset slurred speech and left-sided weakness. CT showed right external capsule hemorrhage. However, her condition getting worse with pulmonary edema, AKI requiring HD, respiratory failure needing ventilation. Transferred pt to CCM for better management.  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  EEG showed severe slowing but no seizure.  2D echo unremarkable  Carotid doppler unremarkable  HgbA1c 5.8  Heparin subq for VTE prophylaxis  Resultant VDRF, left hemiparesis  no antithrombotics prior to admission, currently not on antithrombotics due to hemorrhage.  Ongoing aggressive risk factor management  Clinical improving  Acute Respiratory Failure with severe acute pulmonary edema  trach on 03/02/14  CCM following  Diuresing, IV Lasix, Hemodialysis, urine output minimal today  CXR series  Malignant Hypertension  BP 226/111 at onset  Home meds:   none  SBP goal < 150 due to presence of end organ damage  On amlodipine, clonidine patch and metoprolol and hydralazine  off cardene   Acute renal failure with volume overload, suspect baseline chronic kidney disease  Diuresis and management per critical care, on HD  Continue to trend Cre.  HD as per nephrology  Other Stroke Risk Factors ETOH use   Morbid Obesity, Body mass index is 41.95 kg/(m^2).    Hospital day # 12 03/05/2014 3:50 PM  This patient is critically ill due to cerebral hemorrhage, respiratory failure, renal failure and at significant risk of neurological worsening, death form hematoma expansion, cerebral edema, brain herniation. This patient's care requires constant monitoring of vital signs, hemodynamics, respiratory and cardiac monitoring, review of multiple databases, neurological assessment, discussion with family, other specialists and medical decision making of high complexity. I spent 35 minutes of neurocritical care time in the care of this patient.  Marvel PlanJindong Eupha Lobb, MD PhD Stroke Neurology 03/05/2014 3:50 PM   To contact Stroke Continuity provider, please refer to [email or web address]. After hours, contact General Neurology

## 2014-03-05 NOTE — Progress Notes (Signed)
PULMONARY / CRITICAL CARE MEDICINE   Name: Lynn Gentry MRN: [id number] DOB: 01-Jul-1970    ADMISSION DATE:  02/21/2014 CONSULTATION DATE:  03/05/2014  REFERRING MD :  Pearlean BrownieSethi  CHIEF COMPLAINT:  Respiratory failure in setting of ICH  INITIAL PRESENTATION: 43 y.o. F [facility] 10/6 with left sided weakness, slurred speech, inability to stand up.  In ED, found to have acute lateral right basal ganglia ICH with mild mass effect and 5mm MLS.  Admitted to neuro ICU and developed acute respiratory failure, failed bipap and PCCM called for intubation and management. She was admitted to neuro ICU and later that evening had increasing O2 demands.  She had no improvement in SpO2, work of breathing, and mental status despite 3 hours of BiPAP.  PCCM was consulted for intubation.  STUDIES:  10/06 CT head:  acute hemorrhage into the lateral right basal ganglia, likely indicating a hypertensive hemorrhage/hemorrhagic stroke.  Mild mass effect with 5mm MLS to the left. 10/07 Renal US:  echogenic kidneys s/w ckd, no hydro.  [facility]07 Doppler carotid: consistent with 1-39% stenosis involving the R internal carotid art and the L internal carotid art 10/07 TTE: LVEF 60-65%. Severe concentric LVH. LA dilatation. PASP est 43 mmHg 10[facility]09 LE venous Dopplers (ordered by Stroke) >>  10/15 Trach Ninetta Lights(JY) 10/17 Transition from fentanyl gtt to Duragesic patch. Begin PSV weaning 10/18 Transfer to vent SDU bed ordered. Tolerating PSV 10 cm H2O  SIGNIFICANT EVENTS: 10/6 - admitted for ICH, developed respiratory failure requiring intubation. 10/07 Persistent hypertension. Remained on nicardipine gtt. Persistent volume overload snd pulm edema pattern on CXR. Lasix gtt resulted in minimal increase in Uo 10/08 Worsening renal function, oliguria. Renal consult. HD performed 10/09 Acute change in neuro status. STAT CT [facility] Further HD  planned 10/14 HD with negative fluid balance 10/16 Febrile and purulent matter from nasal cavity and trach.  VITAL SIGNS: Temp:  [98.5 F (36.9 C)-99.5 F (37.5 C)] 98.7 F (37.1 C) (10/18 1554) Pulse Rate:  [64-95] 95 (10/18 1606) Resp:  [15-30] 26 (10/18 1606) BP: (97-134)/(55-75) 126/64 mmHg (10/18 1606) SpO2:  [97 %-100 %] 97 % (10/18 1606) FiO2 (%):  [30 %-40 %] 40 % (10/18 1606) Weight:  [110.9 kg (244 lb 7.8 oz)] 110.9 kg (244 lb 7.8 oz) (10/18 0500)  HEMODYNAMICS:    VENTILATOR SETTINGS: Vent Mode:  [-] PSV;CPAP FiO2 (%):  [30 %-40 %] 40 % Set Rate:  [15 bmp] 15 bmp Vt Set:  [550 mL] 550 mL PEEP:  [5 cmH20] 5 cmH20 Pressure Support:  [5 cmH20-8 cmH20] 5 cmH20 Plateau Pressure:  [23 cmH20] 23 cmH20  INTAKE / OUTPUT: Intake/Output     10/17 0701 - 10/18 0700 10/18 0701 - 10/19 0700   I.V. (mL/kg) 40 (0.4)    Other  50   NG/GT 920 320   IV Piggyback 300 50   Total Intake(mL/kg) 1260 (11.4) 420 (3.8)   Urine (mL/kg/hr) 100 (0)    Other     Total Output 100     Net +1160 +420         PHYSICAL EXAMINATION: General: RASS -2, not following commands Neuro:  Localizes HEENT: [place]/AT. PERRL Cardiovascular: RRR, no M  Lungs: No wheezes Abdomen: Obese, soft, +BS Ext: symmetric mild-mod diffuse edema  LABS:  I have reviewed all of today's lab results. Relevant abnormalities are discussed in the A/P section  CXR:  CM, low volumes, vasc cong, LLL atx  ASSESSMENT / PLAN:  PULMONARY ETT 10/6 >> 10/15 Trach tube 10/15 >>  A: Acute hypoxemic respiratory failure Acute pulmonary edema  - cardiogenic P:   Cont vent support - settings reviewed and/or adjusted Wean in PSV as tolerated Cont vent bundle Daily SBT if/when meets criteria   CARDIOVASCULAR CVL R IJ 10/6 >>  A:  HTN crisis, resolved Severe pulmonary edema, improved Acute systolic v diastolic chf Elevated troponin (pk 4.24 10/07) P:  Continue current rx PRN hydralazine to maintain SBP < 170  mmHg PRN metoprolol to maintain HR < 115/min  RENAL L IJ HD cath 10/08 >>  A:   Acute renal failure, oligoanuric Suspected CKD - likely hypertensive volume overload, improving with HD P:   Monitor BMET intermittently Monitor I/Os Correct electrolytes as indicated HD per renal  GASTROINTESTINAL A:   Obesity P:   SUP: Pantoprazole Cont TFs  HEMATOLOGIC A:   ICU acquired anemia P:  DVT px: SQ heparin Monitor CBC intermittently Transfuse per usual ICU guidelines  INFECTIOUS A:   Fever, purulent ET secretions - concern for PNA P:   Resp 10/17 >>  Blood 10/16 >>  Fluconazole 10/15 >> 10/17 Vancomycin 10/16 >>  Zosyn 10/16 >>   ENDOCRINE A:   Hyperglycemia, controlled P:   CBG's q4hr Cont SSI  NEUROLOGIC A:   Hypertensive R basal ganglion hemorrhage  Acute encephalopathy P:   Mgmt per Stroke team Cont Fent patch Cont PRN fentanyl  RASS goal: 0 Daily WUA.  FAMILY:   TODAY'S SUMMARY: Transfer to vent SDU bed  35 mins CCM time   Billy Fischer, MD ; [facility] service Mobile [phone].  After 5:30 PM or weekends, call [phone]

## 2014-03-05 NOTE — Progress Notes (Signed)
Patient ID: Lynn Gentry, female   DOB: May 19, 1971, 43 y.o.   MRN: [id number] S:nonverbal on trach O:BP 103/58  Pulse 69  Temp(Src) 98.6 F (37 C) (Oral)  Resp 21  Ht 5\' 4"  (1.626 m)  Wt 110.9 kg (244 lb 7.8 oz)  BMI 41.95 kg/m2  SpO2 100%  Intake/Output Summary (Last 24 hours) at 03/05/14 1139 Last data filed at 03/05/14 1000  Gross per 24 hour  Intake 1212.5 ml  Output    100 ml  Net 1112.5 ml   Intake/Output: I/O last 3 completed shifts: In: 1807.5 [I.V.:97.5; NG/GT:1360; IV Piggyback:350] Out: 100 [Urine:100]  Intake/Output this shift:  Total I/O In: 170 [NG/GT:120; IV Piggyback:50] Out: -  Weight change: -4.2 kg (-9 lb 4.2 oz) UVJ:DYNXG AAF on ventillator via trach CVS:rrr Resp:scattered rhonchi ZFP:OIPPG Ext:+anasarca   Recent Labs Lab 02/27/14 0415 02/28/14 0350 03/01/14 0500 03/02/14 0500 03/03/14 0500 03/04/14 0319  NA 138 139 139 141 138 138  K 4.2 3.8 3.6* 4.2 4.5 3.9  CL 98 97 98 100 96 97  CO2 20 21 20 23 23 23   GLUCOSE 129* 139* 135* 128* 117* 136*  BUN 96* 83* 124* 77* 102* 92*  CREATININE 3.93* 4.81* 6.58* 4.63* 6.56* 5.99*  CALCIUM 9.1 9.2 9.2 8.9 9.1 8.5  PHOS 7.0* 5.4* 5.6* 3.8 7.0* 6.1*   Liver Function Tests: No results found for this basename: AST, ALT, ALKPHOS, BILITOT, PROT, ALBUMIN,  in the last 168 hours No results found for this basename: LIPASE, AMYLASE,  in the last 168 hours No results found for this basename: AMMONIA,  in the last 168 hours CBC:  Recent Labs Lab 02/28/14 0350 03/01/14 0500 03/02/14 0500 03/03/14 0500 03/04/14 0319  WBC 16.7* 13.3* 14.2* 15.1* 14.8*  HGB 8.4* 8.0* 8.3* 8.3* 7.6*  HCT 26.4* 24.7* 26.1* 26.7* 24.1*  MCV 84.6 85.8 87.0 90.5 88.3  PLT 249 268 293 281 242   Cardiac Enzymes: No results found for this basename: CKTOTAL, CKMB, CKMBINDEX, TROPONINI,  in the last 168 hours CBG:  Recent Labs Lab 03/04/14 1643 03/04/14 2008 03/05/14 0026 03/05/14 0416 03/05/14 0823  GLUCAP 131*  117* 130* 106* 118*    Iron Studies: No results found for this basename: IRON, TIBC, TRANSFERRIN, FERRITIN,  in the last 72 hours Studies/Results: No results found. Melene Muller ON 03/06/2014] amLODipine  10 mg Per Tube Daily  . antiseptic oral rinse  7 mL Mouth Rinse QID  . [START ON 03/06/2014]  ceFAZolin (ANCEF) IV  2 g Intravenous On Call  . chlorhexidine  15 mL Mouth Rinse BID  . [START ON 03/06/2014] cloNIDine  0.3 mg Transdermal Q Mon  . darbepoetin (ARANESP) injection - DIALYSIS  100 mcg Intravenous Q Mon-HD  . feeding supplement (PRO-STAT SUGAR FREE 64)  60 mL Per Tube BID  . feeding supplement (VITAL HIGH PROTEIN)  1,000 mL Per Tube Q24H  . fentaNYL  100 mcg Transdermal Q72H  . [START ON 03/07/2014] heparin subcutaneous  5,000 Units Subcutaneous 3 times per day  . hydrALAZINE  50 mg Per Tube 4 times per day  . insulin aspart  0-15 Units Subcutaneous 6 times per day  . metoprolol tartrate  50 mg Per Tube BID  . multivitamin  5 mL Per Tube Daily  . pantoprazole sodium  40 mg Per Tube Q1200  . piperacillin-tazobactam (ZOSYN)  IV  2.25 g Intravenous 3 times per day  . senna-docusate  1 tablet Per Tube BID    BMET  Component Value Date/Time   NA 138 03/04/2014 0319   K 3.9 03/04/2014 0319   CL 97 03/04/2014 0319   CO2 23 03/04/2014 0319   GLUCOSE 136* 03/04/2014 0319   BUN 92* 03/04/2014 0319   CREATININE 5.99* 03/04/2014 0319   CALCIUM 8.5 03/04/2014 0319   GFRNONAA 8* 03/04/2014 0319   GFRAA 9* 03/04/2014 0319   CBC    Component Value Date/Time   WBC 14.8* 03/04/2014 0319   RBC 2.73* 03/04/2014 0319   HGB 7.6* 03/04/2014 0319   HCT 24.1* 03/04/2014 0319   PLT 242 03/04/2014 0319   MCV 88.3 03/04/2014 0319   MCH 27.8 03/04/2014 0319   MCHC 31.5 03/04/2014 0319   RDW 16.9* 03/04/2014 0319   LYMPHSABS 1.7 02/22/2014 0035   MONOABS 0.9 02/22/2014 0035   EOSABS 0.0 02/22/2014 0035   BASOSABS 0.0 02/22/2014 0035     Assessment/Plan:  1. AKI vs. Subacute vs.  AKI/CKD- oliguric requiring dialysis. S/p 5 HD treatments with marked improvement of volume and BP  1. Cont with HD qMWF 2. Remains oliguric and dialysis-dependent 3. Will need tunneled HD cath tomorrow.  2. Fevers- per Dr. Sung AmabileSimonds, believes this to be related to respiratory infection and will check CT scan. On abx. 3. Vascular access- has temp HD cath in place and will need tunneled HD catheter tomorrow.  1. [facility] and [facility] temp catheter 4. R BG hemorrhagic stroke- neurosurgery following  1. Stable by repeat CT scan 02/28/14 5. VDRF- per PCCM 6. Anemia- on aranesp 7. Malignant HTN and volume overload- improving with aggressive HD and UF.  1. stopped lasix and she remains oliguric and remains dialysis-dependent.  2. bp much better controlled. 8. Metabolic acidosis- improved with HD 9. ABLA- transfuse prn per PCCM 10. DM- per primary svc 11. Obesity- 12. Protein malnutrition- for PEG today 13. dispo- unclear prognosis, hopefully she will start to exhibit some neurologic improvement as well as renal. Cont with supportive measures for now, however if she requires a trach, her only option for dialysis would be LTC facility. 14.  Demarious Kapur A

## 2014-03-06 ENCOUNTER — Inpatient Hospital Stay (HOSPITAL_COMMUNITY): Payer: 59

## 2014-03-06 LAB — RENAL FUNCTION PANEL
Albumin: 2 g/dL — ABNORMAL LOW (ref 3.5–5.2)
Anion gap: 25 — ABNORMAL HIGH (ref 5–15)
BUN: 152 mg/dL — AB (ref 6–23)
CO2: 21 meq/L (ref 19–32)
CREATININE: 9.14 mg/dL — AB (ref 0.50–1.10)
Calcium: 8.6 mg/dL (ref 8.4–10.5)
Chloride: 93 mEq/L — ABNORMAL LOW (ref 96–112)
GFR calc Af Amer: 5 mL/min — ABNORMAL LOW (ref >90)
GFR calc non Af Amer: 5 mL/min — ABNORMAL LOW (ref >90)
Glucose, Bld: 113 mg/dL — ABNORMAL HIGH (ref 70–99)
Phosphorus: 10.8 mg/dL — ABNORMAL HIGH (ref 2.3–4.6)
Potassium: 4.8 mEq/L (ref 3.7–5.3)
Sodium: 139 mEq/L (ref 137–147)

## 2014-03-06 LAB — GLUCOSE, CAPILLARY
GLUCOSE-CAPILLARY: 108 mg/dL — AB (ref 70–99)
GLUCOSE-CAPILLARY: 113 mg/dL — AB (ref 70–99)
GLUCOSE-CAPILLARY: 132 mg/dL — AB (ref 70–99)
Glucose-Capillary: 103 mg/dL — ABNORMAL HIGH (ref 70–99)
Glucose-Capillary: 130 mg/dL — ABNORMAL HIGH (ref 70–99)
Glucose-Capillary: 99 mg/dL (ref 70–99)

## 2014-03-06 LAB — CBC WITH DIFFERENTIAL/PLATELET
BASOS ABS: 0 10*3/uL (ref 0.0–0.1)
BASOS PCT: 0 % (ref 0–1)
Band Neutrophils: 5 % (ref 0–10)
Blasts: 0 %
EOS ABS: 0 10*3/uL (ref 0.0–0.7)
EOS PCT: 0 % (ref 0–5)
HCT: 23.8 % — ABNORMAL LOW (ref 36.0–46.0)
HEMOGLOBIN: 7.4 g/dL — AB (ref 12.0–15.0)
Lymphocytes Relative: 12 % (ref 12–46)
Lymphs Abs: 1.3 10*3/uL (ref 0.7–4.0)
MCH: 28.2 pg (ref 26.0–34.0)
MCHC: 31.1 g/dL (ref 30.0–36.0)
MCV: 90.8 fL (ref 78.0–100.0)
MYELOCYTES: 0 %
Metamyelocytes Relative: 0 %
Monocytes Absolute: 1.2 10*3/uL — ABNORMAL HIGH (ref 0.1–1.0)
Monocytes Relative: 11 % (ref 3–12)
Neutro Abs: 8.6 10*3/uL — ABNORMAL HIGH (ref 1.7–7.7)
Neutrophils Relative %: 72 % (ref 43–77)
PROMYELOCYTES ABS: 0 %
Platelets: 267 10*3/uL (ref 150–400)
RBC: 2.62 MIL/uL — AB (ref 3.87–5.11)
RDW: 17.3 % — ABNORMAL HIGH (ref 11.5–15.5)
WBC: 11.1 10*3/uL — ABNORMAL HIGH (ref 4.0–10.5)
nRBC: 0 /100 WBC

## 2014-03-06 LAB — CLOSTRIDIUM DIFFICILE BY PCR: Toxigenic C. Difficile by PCR: NEGATIVE

## 2014-03-06 LAB — PROTIME-INR
INR: 1.38 (ref 0.00–1.49)
Prothrombin Time: 17.1 seconds — ABNORMAL HIGH (ref 11.6–15.2)

## 2014-03-06 MED ORDER — DARBEPOETIN ALFA-POLYSORBATE 100 MCG/0.5ML IJ SOLN
INTRAMUSCULAR | Status: AC
  Administered 2014-03-06: 100 ug via INTRAVENOUS
  Filled 2014-03-06: qty 0.5

## 2014-03-06 MED ORDER — LIDOCAINE HCL 1 % IJ SOLN
INTRAMUSCULAR | Status: AC
  Filled 2014-03-06: qty 20

## 2014-03-06 MED ORDER — HEPARIN SODIUM (PORCINE) 1000 UNIT/ML IJ SOLN
INTRAMUSCULAR | Status: AC
  Filled 2014-03-06: qty 1

## 2014-03-06 MED ORDER — MIDAZOLAM HCL 2 MG/2ML IJ SOLN
INTRAMUSCULAR | Status: AC
  Filled 2014-03-06: qty 2

## 2014-03-06 MED ORDER — FENTANYL CITRATE 0.05 MG/ML IJ SOLN
INTRAMUSCULAR | Status: AC
  Filled 2014-03-06: qty 2

## 2014-03-06 NOTE — Progress Notes (Signed)
PULMONARY / CRITICAL CARE MEDICINE   Name: Kathlee NationsStephanie Pocock MRN: [id number] DOB: Jan 25, 1971    ADMISSION DATE:  02/21/2014 CONSULTATION DATE:  03/06/2014  REFERRING MD :  Pearlean BrownieSethi  CHIEF COMPLAINT:  Respiratory failure in setting of ICH  INITIAL PRESENTATION: 43 y.o. F [facility] with left sided weakness, slurred speech, inability to stand up.  In ED, found to have acute lateral right basal ganglia ICH with mild mass effect and 5mm MLS.  Admitted to neuro ICU and developed acute respiratory failure, failed bipap and PCCM called for intubation and management. She was admitted to neuro ICU and later that evening had increasing O2 demands.  She had no improvement in SpO2, work of breathing, and mental status despite 3 hours of BiPAP.  PCCM was consulted for intubation.  STUDIES:  10/06 CT head:  acute hemorrhage into the lateral right basal ganglia, likely indicating a hypertensive hemorrhage/hemorrhagic stroke.  Mild mass effect with 5mm MLS to the left. 10/07 Renal US:  echogenic kidneys s/w ckd, no hydro.  [facility]07 Doppler carotid: consistent with 1-39% stenosis involving the R internal carotid art and the L internal carotid art 10/07 TTE: LVEF 60-65%. Severe concentric LVH. LA dilatation. PASP est 43 mmHg 10/09 [facility]09 LE venous Dopplers (ordered by Stroke) >>  10/15 Trach Ninetta Lights(JY) 10/17 Transition from fentanyl gtt to Duragesic patch. Begin PSV weaning 10/18 Transfer to vent SDU bed ordered. Tolerating PSV 10 cm H2O  SIGNIFICANT EVENTS: 10/6 - admitted for ICH, developed respiratory failure requiring intubation. 10/07 Persistent hypertension. Remained on nicardipine gtt. Persistent volume overload snd pulm edema pattern on CXR. Lasix gtt resulted in minimal increase in Uo 10/08 Worsening renal function, oliguria. Renal consult. HD performed 10/09 Acute change in neuro status. STAT CT [facility] Further HD  planned 10/14 HD with negative fluid balance 10/16 Febrile and purulent matter from nasal cavity and trach.  SUBJ - Remains poorly responsive afebrile  VITAL SIGNS: Temp:  [98 F (36.7 C)-98.7 F (37.1 C)] 98 F (36.7 C) (10/19 0800) Pulse Rate:  [62-95] 84 (10/19 0804) Resp:  [15-26] 18 (10/19 0804) BP: (101-143)/(52-79) 109/60 mmHg (10/19 0804) SpO2:  [97 %-100 %] 100 % (10/19 0804) FiO2 (%):  [40 %] 40 % (10/19 0804) Weight:  [112.1 kg (247 lb 2.2 oz)] 112.1 kg (247 lb 2.2 oz) (10/19 0700)  HEMODYNAMICS:    VENTILATOR SETTINGS: Vent Mode:  [-] CPAP;PSV FiO2 (%):  [40 %] 40 % Set Rate:  [15 bmp] 15 bmp Vt Set:  [550 mL] 550 mL PEEP:  [5 cmH20] 5 cmH20 Pressure Support:  [5 cmH20-8 cmH20] 8 cmH20 Plateau Pressure:  [25 cmH20-27 cmH20] 27 cmH20  INTAKE / OUTPUT: Intake/Output     10/18 0701 - 10/19 0700 10/19 0701 - 10/20 0700   I.V. (mL/kg)     Other 70 20   NG/GT 920    IV Piggyback 100    Total Intake(mL/kg) 1090 (9.7) 20 (0.2)   Urine (mL/kg/hr) 85 (0)    Total Output 85     Net +1005 +20         PHYSICAL EXAMINATION: General: RASS -2, not following commands Neuro:  Localizes, decorticate on left, not moving right HEENT: [place]/AT. PERRL Cardiovascular: RRR, no M  Lungs: No wheezes Abdomen: Obese, soft, +BS Ext: symmetric mild-mod diffuse edema  LABS:  I have reviewed all of today's lab results. Relevant abnormalities are discussed  in the A/P section  CXR: CM, improved edema  ASSESSMENT / PLAN:  PULMONARY ETT 10/6 >> 10/15 Trach tube 10/15 >>  A: Acute hypoxemic respiratory failure Acute pulmonary edema  - cardiogenic -resolved P:   Wean in PSV as tolerated -goal ATC, daytime Cont vent bundle Daily SBT if/when meets criteria   CARDIOVASCULAR CVL R IJ 10/6 >>  A:  HTN crisis, resolved Severe pulmonary edema, improved Acute systolic v diastolic chf Elevated troponin (pk 4.24 10/07) P:  Continue current rx PRN hydralazine to maintain SBP  < 170 mmHg PRN metoprolol to maintain HR < 115/min  RENAL L IJ HD cath 10/08 >>  A:   Acute renal failure, oligoanuric Suspected CKD - likely hypertensive volume overload, improving with HD P:   Monitor BMET intermittently Monitor I/Os Correct electrolytes as indicated HD per renal  GASTROINTESTINAL A:   Obesity P:   SUP: Pantoprazole Cont TFs  HEMATOLOGIC A:   ICU acquired anemia P:  DVT px: SQ heparin Monitor CBC intermittently Transfuse per usual ICU guidelines  INFECTIOUS A:   Fever, purulent ET secretions - concern for PNA P:   Resp 10/17 >>  Blood 10/16 >> ng Fluconazole 10/15 >> 10/17 Vancomycin 10/16 >> 10/19 Zosyn 10/16 >>   siplify abx once resp cx back  ENDOCRINE A:   Hyperglycemia, controlled P:   CBG's q4hr Cont SSI  NEUROLOGIC A:   Hypertensive R basal ganglion hemorrhage  Acute encephalopathy P:   Mgmt per Stroke team Cont Fent patch Cont PRN fentanyl  RASS goal: 0 Daily WUA.  FAMILY:   TODAY'S SUMMARY: Transfer to vent SDU bed, hope to wean to ATC eventually but neuro prognossi appears poor  The patient is critically ill with multiple organ systems failure and requires high complexity decision making for assessment and support, frequent evaluation and titration of therapies, application of advanced monitoring technologies and extensive interpretation of multiple databases. Critical Care Time devoted to patient care services described in this note is 31 minutes.   Cyril Mourning MD. Tonny Bollman. [facility] Pager [phone] If no response call [phone]

## 2014-03-06 NOTE — Progress Notes (Signed)
STROKE TEAM PROGRESS NOTE   HISTORY Lynn Gentry is an 43 y.o. female with no previously documented medical disorder who was brought to [facility] with new onset slurred speech and left-sided weakness. Patient went to work as usual until 1500. She was found by her son at home at 1600 unable to get up out of a chair with left sided weakness, slurred speech. Patient was last known well around 3 PM this afternoon 02/21/2014. CT scan of her head showed an acute 4.1 x 1.5 cm right basal ganglia hemorrhage with mild mass effect and right to left shift of 5 mm. Patient's blood pressure was markedly elevated at 226/111. She was started on Cardene drip and blood pressure responded well. She has shown continual respiratory difficulty. She was initially given oxygen by nasal cannula followed by NBR, and subsequently placed on BiPAP. She continued to have oxygen saturations only in the high 80s to low 90s on 100% O2, with a respiratory rates in the 40s. ABG showed pH of 2.82, PCO2 37 PO2 of 57. Chest x-ray showed mild cardiomegaly with possible asymmetric pulmonary edema. BNP was 36,455. Because of deteriorating respiratory status intubation and mechanical ventilation was elected. CCM service was consulted. Patient was not administered TPA secondary to ICH. She was admitted to the neuro ICU  for further evaluation and treatment.   SUBJECTIVE (INTERVAL HISTORY) Pt remains responsive today, open eyes on voice although not tracking and not following any commands. Withdraw to LLE more than other limbs on pain stimulation. As per RN, pt tolerating trach collar and may be able to remain flat for MRI. I discussed this with Dr Vassie Loll who agrees and above plan with family at bedside OBJECTIVE Temp:  [97.8 F (36.6 C)-98.7 F (37.1 C)] 97.8 F (36.6 C) (10/19 1200) Pulse Rate:  [62-95] 73 (10/19 1300) Cardiac Rhythm:  [-] Normal sinus rhythm (10/19 0800) Resp:  [15-26] 22 (10/19 1300) BP: (101-143)/(52-79)  123/72 mmHg (10/19 1300) SpO2:  [97 %-100 %] 100 % (10/19 1300) FiO2 (%):  [40 %] 40 % (10/19 1213) Weight:  [247 lb 2.2 oz (112.1 kg)] 247 lb 2.2 oz (112.1 kg) (10/19 0700)   Recent Labs Lab 03/05/14 2037 03/05/14 2358 03/06/14 0421 03/06/14 0824 03/06/14 1158  GLUCAP 132* 132* 103* 113* 130*    Recent Labs Lab 02/28/14 0350 03/01/14 0500 03/02/14 0500 03/03/14 0500 03/04/14 0319 03/06/14 0430  NA 139 139 141 138 138 139  K 3.8 3.6* 4.2 4.5 3.9 4.8  CL 97 98 100 96 97 93*  CO2 21 20 23 23 23 21   GLUCOSE 139* 135* 128* 117* 136* 113*  BUN 83* 124* 77* 102* 92* 152*  CREATININE 4.81* 6.58* 4.63* 6.56* 5.99* 9.14*  CALCIUM 9.2 9.2 8.9 9.1 8.5 8.6  MG 2.4 2.7* 2.3 2.5 2.4  --   PHOS 5.4* 5.6* 3.8 7.0* 6.1* 10.8*    Recent Labs Lab 03/06/14 0430  ALBUMIN 2.0*    Recent Labs Lab 03/01/14 0500 03/02/14 0500 03/03/14 0500 03/04/14 0319 03/06/14 0430  WBC 13.3* 14.2* 15.1* 14.8* 11.1*  NEUTROABS  --   --   --   --  8.6*  HGB 8.0* 8.3* 8.3* 7.6* 7.4*  HCT 24.7* 26.1* 26.7* 24.1* 23.8*  MCV 85.8 87.0 90.5 88.3 90.8  PLT 268 293 281 242 267   No results found for this basename: CKTOTAL, CKMB, CKMBINDEX, TROPONINI,  in the last 168 hours  Recent Labs  03/06/14 0430  LABPROT 17.1*  INR 1.38  No results found for this basename: COLORURINE, APPERANCEUR, LABSPEC, PHURINE, GLUCOSEU, HGBUR, BILIRUBINUR, KETONESUR, PROTEINUR, UROBILINOGEN, NITRITE, LEUKOCYTESUR,  in the last 72 hours     Component Value Date/Time   CHOL 177 02/28/2014 0350   Lab Results  Component Value Date   HGBA1C 5.8* 02/23/2014   No results found for this basename: labopia,  cocainscrnur,  labbenz,  amphetmu,  thcu,  labbarb    No results found for this basename: ETH,  in the last 168 hours  Ct Head Wo Contrast 02/28/14 - Stable 4.2 x 1.1 cm right lenticular nucleus hematoma with mild  surrounding vasogenic edema. Local mass effect upon the right  lateral ventricle with slight bowing  of the septum to the left by  3.9 mm without significant change.  This may represent result of hypertensive hemorrhage and can be  assessed as this clears.  Prominent white matter type changes asymmetric greater on the left  more conspicuous on the present exam than on the prior exam. The  patient may benefit from followup MR and MR angiogram for further  delineation. This may be ischemic in origin possibly related to  watershed type infarct. Other causes not excluded although felt to  be less likely.  02/24/2014 Stable appearance of the hematoma within the right basal ganglia. No new or progressive findings.  02/22/2014   1. No significant interval change in the patient's intraparenchymal hemorrhage at the right lateral basal ganglia, measuring 4.1 x 1.2 cm, with mild surrounding vasogenic edema. Mild associated mass effect, with 4 mm of leftward midline shift again seen. 2. Scattered small vessel ischemic microangiopathy. 3. Bilateral proptosis noted.     02/21/2014   1. Acute hemorrhage into the lateral right basal ganglia, likely indicating a hypertensive hemorrhage/hemorrhagic stroke. 2. Mild mass effect with shift of the midline to the left approximating 5 mm. Critical   US Renal Port 02/22/2014    1. Small echogenic kidneys consistent with chronic renal medical disease. No hydronephrosis. 2. The urinary bladder is decompressed by Foley catheter and cannot be evaluated.     Dg Chest Port 1 View 02/22/2014    1. Satisfactory positioning of endotracheal tube and right IJ line. 2. Cardiomegaly with persistent diffuse interstitial and airspace disease likely reflecting edema and congestive heart failure. 3. Bilateral pleural effusions and basilar airspace disease. This likely reflects atelectasis.    02/21/2014    Mild cardiac enlargement and possible asymmetric pulmonary edema. However, exam is limited by body habitus.     2D echo - - Left ventricle: The cavity size was normal. There was  severe concentric hypertrophy. Systolic function was normal. The estimated ejection fraction was in the range of 60% to 65%. Wall motion was normal; there were no regional wall motion abnormalities. - Left atrium: The atrium was mildly dilated. - Pulmonary arteries: PA peak pressure: 43 mm Hg (S). - Pericardium, extracardiac: A small, free-flowing pericardial effusion was identified posterior to the heart and circumferential to the heart. The fluid had no internal echoes.There was no evidence of hemodynamic compromise. Impressions: - The right ventricular systolic pressure was increased consistent with moderate pulmonary hypertension.  CUS - - Findings consistent with 1-39 percent stenosis involving the right internal carotid artery and the left internal carotid artery. - Right vertebral artery not imaged due to central line dressing. Left vertebral artery not imaged due to patient position.  DVT - - No obvious evidence of deep vein or superficial thrombosis involving the right lower extremity and left lower  extremity. - No evidence of Baker&'s cyst on the right or left.  EEG 02/28/14 - This EEG is abnormal with severe generalized slowing of cerebral activity consistent with severe encephalopathic state. These findings are nonspecific and can be seen with metabolic as well as toxic and degenerative encephalopathies. No evidence of an epileptic disorder was demonstrated.  A1C 5.8 and LDL 92  PHYSICAL EXAM Young obese Caucasian lady not in distress. On trach collar  . Afebrile. Head is nontraumatic. Neck is supple without bruit. Cardiac exam soft ejection murmur RRR .no gallop. Lungs bilateral crackles and  rhonchi to auscultation. Distal pulses are well felt.   Neurological Exam: on trach collar now, eyes open on voice but not tracking and not following commands. Eyes in the middle. Pupils 3 mm reactive. Fundi not visualized. Left lower face weakness, corneal and gag present. On pain  stimulation, she had trace withdraw to pain on RUE and RLE and LUE and more on LLE. Left Plantar upgoing and right down going.  ASSESSMENT/PLAN Ms. Lynn Gentry is a 43 y.o. female with no documented medical history found with new onset slurred speech and left-sided weakness. CT showed right external capsule hemorrhage. However, her condition getting worse with pulmonary edema, AKI requiring HD, respiratory failure needing ventilation. Transferred pt to CCM for better management.  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  EEG showed severe slowing but no seizure.  2D echo unremarkable  Carotid doppler unremarkable  HgbA1c 5.8  Heparin subq for VTE prophylaxis  Resultant VDRF, left hemiparesis  no antithrombotics prior to admission, currently not on antithrombotics due to hemorrhage.  Ongoing aggressive risk factor management  Clinical improving  Acute Respiratory Failure with severe acute pulmonary edema  trach on 03/02/14  CCM following  Diuresing, IV Lasix, Hemodialysis, urine output minimal today  CXR series  Malignant Hypertension  BP 226/111 at onset  Home meds:   none  SBP goal < 150 due to presence of end organ damage  On amlodipine, clonidine patch and metoprolol and hydralazine  off cardene   Acute renal failure with volume overload, suspect baseline chronic kidney disease  Diuresis and management per critical care, on HD  Continue to trend  Cre.  HD as per nephrology  Other Stroke Risk Factors ETOH use   Morbid Obesity, Body mass index is 42.4 kg/(m^2).   Hospital day # 13 03/06/2014 2:12 PM  This patient is critically ill due to cerebral hemorrhage, respiratory failure, renal failure and at significant risk of neurological worsening, death form hematoma expansion, cerebral edema, brain herniation. This patient's care requires constant monitoring of vital signs, hemodynamics, respiratory and cardiac monitoring, review of multiple databases, neurological assessment, discussion with family, other specialists and medical decision making of high complexity. I spent 30 minutes of neurocritical care time in the care of this patient.  Delia HeadyPramod Anam Bobby, MD Stroke Neurology 03/06/2014 2:12 PM   To contact Stroke Continuity provider, please refer to [email or web address]. After hours, contact General Neurology

## 2014-03-06 NOTE — Progress Notes (Signed)
Assessment/Plan:  1. AKI vs. Subacute vs. AKI/CKD- oliguric requiring dialysis. S/p 5 HD treatments with marked improvement of volume and BP  1. Cont with HD MWF WIll do today as pt very catabolic 2. Remains oliguric and dialysis-dependent 3. Will need tunneled HD cath per IR, delayed due to elevated WBC. 2. Fevers- per Dr. Sung AmabileSimonds, believes this to be related to respiratory infection and will check CT scan. On abx. 3. R BG hemorrhagic stroke- neurosurgery following  1. Stable by repeat CT scan 02/28/14 4. VDRF- per PCCM 5. Anemia- on aranesp 6. Malignant HTN and volume overload- improving with aggressive HD and UF.  7. S/p Trach   Objective: Vital signs in last 24 hours: Temp:  [97.8 F (36.6 C)-98.1 F (36.7 C)] 97.9 F (36.6 C) (10/19 1600) Pulse Rate:  [37-88] 43 (10/19 1700) Resp:  [15-27] 27 (10/19 1700) BP: (101-143)/(52-79) 129/64 mmHg (10/19 1738) SpO2:  [98 %-100 %] 100 % (10/19 1700) FiO2 (%):  [40 %] 40 % (10/19 1540) Weight:  [112.1 kg (247 lb 2.2 oz)] 112.1 kg (247 lb 2.2 oz) (10/19 0700) Weight change: 1.2 kg (2 lb 10.3 oz)  Intake/Output from previous day: 10/18 0701 - 10/19 0700 In: 1090 [NG/GT:920; IV Piggyback:100] Out: 85 [Urine:85] Intake/Output this shift: Total I/O In: 400 [Other:260; NG/GT:40; IV Piggyback:100] Out: 100 [Urine:100]  General appearance: unresponsive Head: Normocephalic, without obvious abnormality, atraumatic Resp: clear to auscultation bilaterally Cor RRR  Lab Results:  Recent Labs  03/04/14 0319 03/06/14 0430  WBC 14.8* 11.1*  HGB 7.6* 7.4*  HCT 24.1* 23.8*  PLT 242 267   BMET:  Recent Labs  03/04/14 0319 03/06/14 0430  NA 138 139  K 3.9 4.8  CL 97 93*  CO2 23 21  GLUCOSE 136* 113*  BUN 92* 152*  CREATININE 5.99* 9.14*  CALCIUM 8.5 8.6   No results found for this basename: PTH,  in the last 72 hours Iron Studies: No results found for this basename: IRON, TIBC, TRANSFERRIN, FERRITIN,  in the last 72  hours Studies/Results: Dg Chest Port 1 View  03/06/2014   CLINICAL DATA:  43 year old female with respiratory failure  EXAM: PORTABLE CHEST - 1 VIEW  COMPARISON:  Multiple prior comparison most recent 03/05/2014  FINDINGS: Heart size is unchanged, with cardiomegaly.  Decreasing prominence of the central pulmonary vasculature. Decreasing prominence of interlobular septal thickening over the course of several examinations.  No pneumothorax.  Bases of the lungs not well visualized.  Interval placement of endotracheal tube with a new tracheostomy. Unchanged left IJ central venous catheter, and right IJ central catheter.  IMPRESSION: Improving interstitial edema and pulmonary congestion over the course of several examinations, relatively unchanged from the most recent comparison.  Interval placement of a new tracheostomy, with removal of endotracheal tube. Otherwise unchanged support apparatus.  Signed,  Yvone NeuJaime S. Loreta AveWagner, DO  [facility]  [facility]   Electronically Signed   By: Gilmer MorJaime  Wagner D.O.   On: 03/06/2014 08:02   Dg Chest Port 1 View  03/05/2014   CLINICAL DATA:  Congestive heart failure with pulmonary edema.  EXAM: PORTABLE CHEST - 1 VIEW  COMPARISON:  03/03/2014.  FINDINGS: A poor inspiration is again demonstrated with stable enlargement of the cardiac silhouette. The bilateral jugular catheters are unchanged. The tracheostomy tube is in satisfactory position. Decreased prominence of the pulmonary vasculature. Probable positional scoliosis.  IMPRESSION: No acute abnormality. Improved pulmonary vascular congestion with stable cardiomegaly.   Electronically Signed   By:  Gordan Payment M.D.   On: 03/05/2014 17:28    Scheduled: . amLODipine  10 mg Per Tube Daily  . antiseptic oral rinse  7 mL Mouth Rinse QID  . chlorhexidine  15 mL Mouth Rinse BID  . cloNIDine  0.3 mg Transdermal Q Mon  . darbepoetin (ARANESP) injection - DIALYSIS  100 mcg Intravenous Q  Mon-HD  . feeding supplement (PRO-STAT SUGAR FREE 64)  60 mL Per Tube BID  . feeding supplement (VITAL HIGH PROTEIN)  1,000 mL Per Tube Q24H  . fentaNYL  100 mcg Transdermal Q72H  . [START ON 03/07/2014] heparin subcutaneous  5,000 Units Subcutaneous 3 times per day  . hydrALAZINE  50 mg Per Tube 4 times per day  . insulin aspart  0-15 Units Subcutaneous 6 times per day  . metoprolol tartrate  50 mg Per Tube BID  . multivitamin  5 mL Per Tube Daily  . pantoprazole sodium  40 mg Per Tube Q1200  . piperacillin-tazobactam (ZOSYN)  IV  2.25 g Intravenous 3 times per day  . senna-docusate  1 tablet Per Tube BID     LOS: 13 days   Chas Axel C 03/06/2014,6:15 PM

## 2014-03-06 NOTE — Telephone Encounter (Signed)
Forms, FMLA and [address]letta [address] Roda Shutters completed called patient husband for pick up 03-06-14.

## 2014-03-06 NOTE — Progress Notes (Signed)
Dr. Lowell Guitar notified of above. Ordered to proceed with HD through existing cath. Hemo notified of above as well.

## 2014-03-06 NOTE — Progress Notes (Signed)
Report given to [facility] on [facility].

## 2014-03-06 NOTE — Progress Notes (Signed)
Pt placed back on VENT for QHS.  Pt tolerating current settings at this time.  RT to monitor and assess as needed.

## 2014-03-06 NOTE — Progress Notes (Signed)
RT Note: Patient was transported to and from interventional radiology by charge RT, [facility] with no complications. Patient was transported on the ventilator. She was placed back on ATC to continue her weaning once she was back in her room and stable. She continues on her ATC well with no issues. Rt will continue to monitor.

## 2014-03-06 NOTE — Progress Notes (Signed)
Please see Resident Note for additional details.  She is scheduled for a tunneled catheter today as well as dialysis.  Very catabolic with increased azotemia but mental status does not improve with HD.  Will continue support.  Lun Muro C

## 2014-03-06 NOTE — Progress Notes (Signed)
IR MD spoke to Dr. Vassie Loll on phone. It was decided to wait 24 hours to place tunelled HD cath due to WBC 11.1. Dr. Lowell Guitar paged to notify.

## 2014-03-07 ENCOUNTER — Inpatient Hospital Stay (HOSPITAL_COMMUNITY): Payer: 59

## 2014-03-07 LAB — CBC
HEMATOCRIT: 24.7 % — AB (ref 36.0–46.0)
Hemoglobin: 7.8 g/dL — ABNORMAL LOW (ref 12.0–15.0)
MCH: 28.6 pg (ref 26.0–34.0)
MCHC: 31.6 g/dL (ref 30.0–36.0)
MCV: 90.5 fL (ref 78.0–100.0)
PLATELETS: 263 10*3/uL (ref 150–400)
RBC: 2.73 MIL/uL — ABNORMAL LOW (ref 3.87–5.11)
RDW: 17.5 % — ABNORMAL HIGH (ref 11.5–15.5)
WBC: 9 10*3/uL (ref 4.0–10.5)

## 2014-03-07 LAB — RENAL FUNCTION PANEL
Albumin: 2.1 g/dL — ABNORMAL LOW (ref 3.5–5.2)
Anion gap: 19 — ABNORMAL HIGH (ref 5–15)
BUN: 80 mg/dL — ABNORMAL HIGH (ref 6–23)
CALCIUM: 8.5 mg/dL (ref 8.4–10.5)
CO2: 25 meq/L (ref 19–32)
Chloride: 96 mEq/L (ref 96–112)
Creatinine, Ser: 5.98 mg/dL — ABNORMAL HIGH (ref 0.50–1.10)
GFR calc Af Amer: 9 mL/min — ABNORMAL LOW (ref >90)
GFR, EST NON AFRICAN AMERICAN: 8 mL/min — AB (ref >90)
Glucose, Bld: 124 mg/dL — ABNORMAL HIGH (ref 70–99)
PHOSPHORUS: 6 mg/dL — AB (ref 2.3–4.6)
Potassium: 3.8 mEq/L (ref 3.7–5.3)
Sodium: 140 mEq/L (ref 137–147)

## 2014-03-07 LAB — GLUCOSE, CAPILLARY
GLUCOSE-CAPILLARY: 129 mg/dL — AB (ref 70–99)
Glucose-Capillary: 106 mg/dL — ABNORMAL HIGH (ref 70–99)
Glucose-Capillary: 109 mg/dL — ABNORMAL HIGH (ref 70–99)
Glucose-Capillary: 113 mg/dL — ABNORMAL HIGH (ref 70–99)
Glucose-Capillary: 121 mg/dL — ABNORMAL HIGH (ref 70–99)
Glucose-Capillary: 82 mg/dL (ref 70–99)

## 2014-03-07 MED ORDER — SODIUM CHLORIDE 0.9 % IJ SOLN
10.0000 mL | INTRAMUSCULAR | Status: DC | PRN
  Administered 2014-03-07: 10 mL

## 2014-03-07 MED ORDER — LIDOCAINE HCL 1 % IJ SOLN
INTRAMUSCULAR | Status: AC
  Filled 2014-03-07: qty 20

## 2014-03-07 MED ORDER — HEPARIN SODIUM (PORCINE) 1000 UNIT/ML IJ SOLN
INTRAMUSCULAR | Status: AC
  Filled 2014-03-07: qty 1

## 2014-03-07 MED ORDER — FENTANYL CITRATE 0.05 MG/ML IJ SOLN
INTRAMUSCULAR | Status: AC | PRN
  Administered 2014-03-07: 25 ug via INTRAVENOUS

## 2014-03-07 MED ORDER — FENTANYL CITRATE 0.05 MG/ML IJ SOLN
INTRAMUSCULAR | Status: AC
  Filled 2014-03-07: qty 2

## 2014-03-07 MED ORDER — MIDAZOLAM HCL 2 MG/2ML IJ SOLN
INTRAMUSCULAR | Status: AC
  Filled 2014-03-07: qty 2

## 2014-03-07 MED ORDER — ALTEPLASE 2 MG IJ SOLR
2.0000 mg | Freq: Once | INTRAMUSCULAR | Status: AC
  Administered 2014-03-07: 2 mg
  Filled 2014-03-07: qty 2

## 2014-03-07 MED ORDER — VANCOMYCIN HCL 10 G IV SOLR
1500.0000 mg | Freq: Once | INTRAVENOUS | Status: AC
  Administered 2014-03-07: 1500 mg via INTRAVENOUS
  Filled 2014-03-07: qty 1500

## 2014-03-07 MED ORDER — CHLORHEXIDINE GLUCONATE 4 % EX LIQD
CUTANEOUS | Status: AC
  Filled 2014-03-07: qty 15

## 2014-03-07 NOTE — Progress Notes (Signed)
PULMONARY / CRITICAL CARE MEDICINE   Name: Lynn Gentry MRN: [id number] DOB: August 04, 1970    ADMISSION DATE:  02/21/2014 CONSULTATION DATE:  03/07/2014  REFERRING MD :  Pearlean BrownieSethi  CHIEF COMPLAINT:  Respiratory failure in setting of ICH  INITIAL PRESENTATION: 43 y.o. F [facility] with left sided weakness, slurred speech, inability to stand up.  In ED, found to have acute lateral right basal ganglia ICH with mild mass effect and 5mm MLS.  Admitted to neuro ICU and developed acute respiratory failure, failed bipap and PCCM called for intubation and management. She was admitted to neuro ICU and later that evening had increasing O2 demands.  She had no improvement in SpO2, work of breathing, and mental status despite 3 hours of BiPAP.  PCCM was consulted for intubation.  STUDIES:  10/06 CT head:  acute hemorrhage into the lateral right basal ganglia, likely indicating a hypertensive hemorrhage/hemorrhagic stroke.  Mild mass effect with 5mm MLS to the left. 10/07 Renal US:  echogenic kidneys s/w ckd, no hydro.  10/07 CT head[facility] 10/07 [facility] CT [facility] 10/07 Doppler carotid: consistent with 1-39% stenosis involving the R internal carotid art and the L internal carotid art 10/07 TTE: LVEF 60-65%. Severe concentric LVH. LA dilatation. PASP est 43 mmHg 10[facility]09 LE venous Dopplers (ordered by Stroke) >> negative 10/20 MRI brain >>   SIGNIFICANT EVENTS: 10/6 - admitted for ICH, developed respiratory failure requiring intubation. 10/07 Persistent hypertension. Remained on nicardipine gtt. Persistent volume overload snd pulm edema pattern on CXR. Lasix gtt resulted in minimal increase in Uo 10/08 Worsening renal function, oliguria. Renal consult. HD performed 10/09 Acute change in neuro status. STAT [facility] Further HD planned 10/14 HD with negative fluid balance 10/15 Trach (JY) 10/16 Febrile and purulent matter from nasal cavity and  trach. 10/17 Transition from fentanyl gtt to Duragesic patch. Begin PSV weaning 10/18 Transfer to vent SDU bed ordered. Tolerating PSV 10 cm H2O 10/19 Transfer to SDU/2900  SUBJ - No acute issues  VITAL SIGNS: Temp:  [96.6 F (35.9 C)-98.7 F (37.1 C)] 98.7 F (37.1 C) (10/20 0755) Pulse Rate:  [37-94] 84 (10/20 0840) Resp:  [15-28] 28 (10/20 0840) BP: (118-161)/(59-82) 135/72 mmHg (10/20 0840) SpO2:  [98 %-100 %] 100 % (10/20 0840) FiO2 (%):  [40 %] 40 % (10/20 0840) Weight:  [112.5 kg (248 lb 0.3 oz)-117.4 kg (258 lb 13.1 oz)] 112.5 kg (248 lb 0.3 oz) (10/20 0400)  HEMODYNAMICS:    VENTILATOR SETTINGS: Vent Mode:  [-] PRVC FiO2 (%):  [40 %] 40 % Set Rate:  [15 bmp] 15 bmp Vt Set:  [550 mL] 550 mL PEEP:  [5 cmH20] 5 cmH20 Plateau Pressure:  [21 cmH20-23 cmH20] 21 cmH20  INTAKE / OUTPUT: Intake/Output     10/19 0701 - 10/20 0700 10/20 0701 - 10/21 0700   Other 540    NG/GT 480    IV Piggyback 150    Total Intake(mL/kg) 1170 (10.4)    Urine (mL/kg/hr) 100 (0)    Other 5000 (1.9)    Total Output 5100     Net -3930           PHYSICAL EXAMINATION:  Gen: minimally responsive to ext stimuli HEENT: Trach site c/d/i PULM: rhonchi bilaterally CV: RRR, no mgr AB: BS+, soft, nontender, PEG Ext: trace edema Neuro: minimal responsive to external stimuli, withdraws to pain RUE  LABS:  I have reviewed all of today's  lab results. Relevant abnormalities are discussed in the A/P section  CXR: CM, improved edema  ASSESSMENT / PLAN:  PULMONARY ETT 10/6 >> 10/15 Trach tube 10/15 >>  A: Acute hypoxemic respiratory failure Acute pulmonary edema  - cardiogenic -resolved P:   Wean in PSV as tolerated -goal ATC, daytime Cont vent bundle Daily SBT if/when meets criteria   CARDIOVASCULAR CVL R IJ 10/6 >>  A:  HTN crisis, resolved Severe pulmonary edema, improved Acute systolic v diastolic chf > resolved NSTEMI Elevated troponin (pk 4.24 10/07) > type 2/demand,  resolved P:  Continue current rx D/C CVL out> change to peripheral IV PRN hydralazine to maintain SBP < 170 mmHg PRN metoprolol to maintain HR < 115/min  RENAL L IJ HD cath 10/08 >>  A:   Acute renal failure, oligoanuric Suspected CKD - likely hypertensive volume overload, improving with HD P:   Monitor BMET intermittently Monitor I/Os Correct electrolytes as indicated HD per renal > volume removal as tolerated Perm cath?  GASTROINTESTINAL A:   Obesity P:   SUP: Pantoprazole Cont TFs  HEMATOLOGIC A:   ICU acquired anemia P:  DVT px: SQ heparin Monitor CBC intermittently Transfuse per usual ICU guidelines  INFECTIOUS A:   HCAP (but not vent associated event based on vent data) > MSSA P:   Resp 10/17 >> Abundant staph aureus Blood 10/16 >> ng C.diff 10/20 >> neg Fluconazole 10/15 >> 10/17 Vancomycin 10/16 >> plan 7 days Zosyn 10/16 >> 10/20   ENDOCRINE A:   Hyperglycemia, controlled P:   CBG's q4hr Cont SSI  NEUROLOGIC A:   Hypertensive R basal ganglion hemorrhage  Acute encephalopathy Concern for second ischemia subcortical event P:   Mgmt per Stroke team MRI brain 10/20 pending>> Cont Fent patch Cont PRN fentanyl  RASS goal: 0 Daily WUA.  FAMILY: Updated by stroke team 10/20  TODAY'S SUMMARY: Hope to wean to ATC eventually but neuro prognossi appears poor. MRI brain pending for possible second stroke.  Heber Barton, MD [place] [facility] Pager: [phone] Cell: [phone] If no response, call [phone]

## 2014-03-07 NOTE — Progress Notes (Signed)
Please see Resident dote by Dr. Delford Field for additional details. She had delay of [facility] placement yesterday due to concern of elevated WBC.  HD done yesterday and 5000 ccs was removed.  Today hemodynamics are stable and WBC < 10k.  Will plan for next HD Wed.  Pt currently not suitable for OP HD due to comorbidities. Jenell Dobransky C

## 2014-03-07 NOTE — Progress Notes (Signed)
Per MD, Pt to be placed on Full support Ventilation during MRI.  Pt placed on PRVC 550, RR 15, Peep 5 and 40%.  Pt left for MRI at 2345, upon return pt will be placed back on ATC as tolerated.  RT to monitor and assess as needed.

## 2014-03-07 NOTE — Procedures (Signed)
Successful placement of tunneled HD catheter with tips terminating within the superior aspect of the right atrium.   The patient tolerated the procedure well without immediate post procedural complication. The catheter is ready for immediate use.  

## 2014-03-07 NOTE — Progress Notes (Signed)
TL Central Line is occluded, paged LAB to come draw Morning CBC

## 2014-03-07 NOTE — Progress Notes (Signed)
STROKE TEAM PROGRESS NOTE   HISTORY Jillayne Blumenschein is an 43 y.o. female with no previously documented medical disorder who was brought to [facility] with new onset slurred speech and left-sided weakness. Patient went to work as usual until 1500. She was found by her son at home at 1600 unable to get up out of a chair with left sided weakness, slurred speech. Patient was last known well around 3 PM this afternoon 02/21/2014. CT scan of her head showed an acute 4.1 x 1.5 cm right basal ganglia hemorrhage with mild mass effect and right to left shift of 5 mm. Patient's blood pressure was markedly elevated at 226/111. She was started on Cardene drip and blood pressure responded well. She has shown continual respiratory difficulty. She was initially given oxygen by nasal cannula followed by NBR, and subsequently placed on BiPAP. She continued to have oxygen saturations only in the high 80s to low 90s on 100% O2, with a respiratory rates in the 40s. ABG showed pH of 2.82, PCO2 37 PO2 of 57. Chest x-ray showed mild cardiomegaly with possible asymmetric pulmonary edema. BNP was 36,455. Because of deteriorating respiratory status intubation and mechanical ventilation was elected. CCM service was consulted. Patient was not administered TPA secondary to ICH. She was admitted to the neuro ICU  for further evaluation and treatment.   SUBJECTIVE (INTERVAL HISTORY) Pt remains responsive today, open eyes on voice although not tracking and not following any commands. Withdraw to LLE more than other limbs on pain stimulation.  D/w  with family at bedside OBJECTIVE Temp:  [96.6 F (35.9 C)-98.8 F (37.1 C)] 98.8 F (37.1 C) (10/20 1231) Pulse Rate:  [43-94] 68 (10/20 1545) Cardiac Rhythm:  [-] Normal sinus rhythm (10/20 1545) Resp:  [15-28] 18 (10/20 1545) BP: (123-161)/(60-90) 136/79 mmHg (10/20 1545) SpO2:  [99 %-100 %] 100 % (10/20 1545) FiO2 (%):  [40 %] 40 % (10/20 1339) Weight:  [248 lb 0.3 oz  (112.5 kg)-258 lb 13.1 oz (117.4 kg)] 248 lb 0.3 oz (112.5 kg) (10/20 0400)   Recent Labs Lab 03/06/14 2143 03/07/14 0004 03/07/14 0400 03/07/14 0758 03/07/14 1233  GLUCAP 99 82 109* 106* 129*    Recent Labs Lab 03/01/14 0500 03/02/14 0500 03/03/14 0500 03/04/14 0319 03/06/14 0430 03/07/14 0530  NA 139 141 138 138 139 140  K 3.6* 4.2 4.5 3.9 4.8 3.8  CL 98 100 96 97 93* 96  CO2 20 23 23 23 21 25   GLUCOSE 135* 128* 117* 136* 113* 124*  BUN 124* 77* 102* 92* 152* 80*  CREATININE 6.58* 4.63* 6.56* 5.99* 9.14* 5.98*  CALCIUM 9.2 8.9 9.1 8.5 8.6 8.5  MG 2.7* 2.3 2.5 2.4  --   --   PHOS 5.6* 3.8 7.0* 6.1* 10.8* 6.0*    Recent Labs Lab 03/06/14 0430 03/07/14 0530  ALBUMIN 2.0* 2.1*    Recent Labs Lab 03/02/14 0500 03/03/14 0500 03/04/14 0319 03/06/14 0430 03/07/14 0845  WBC 14.2* 15.1* 14.8* 11.1* 9.0  NEUTROABS  --   --   --  8.6*  --   HGB 8.3* 8.3* 7.6* 7.4* 7.8*  HCT 26.1* 26.7* 24.1* 23.8* 24.7*  MCV 87.0 90.5 88.3 90.8 90.5  PLT 293 281 242 267 263   No results found for this basename: CKTOTAL, CKMB, CKMBINDEX, TROPONINI,  in the last 168 hours  Recent Labs  03/06/14 0430  LABPROT 17.1*  INR 1.38   No results found for this basename: COLORURINE, APPERANCEUR, LABSPEC, PHURINE, GLUCOSEU, HGBUR,  BILIRUBINUR, KETONESUR, PROTEINUR, UROBILINOGEN, NITRITE, LEUKOCYTESUR,  in the last 72 hours     Component Value Date/Time   CHOL 177 02/28/2014 0350   Lab Results  Component Value Date   HGBA1C 5.8* 02/23/2014   No results found for this basename: labopia,  cocainscrnur,  labbenz,  amphetmu,  thcu,  labbarb    No results found for this basename: ETH,  in the last 168 hours  Ct Head Wo Contrast 02/28/14 - Stable 4.2 x 1.1 cm right lenticular nucleus hematoma with mild  surrounding vasogenic edema. Local mass effect upon the right  lateral ventricle with slight bowing of the septum to the left by  3.9 mm without significant change.  This may represent  result of hypertensive hemorrhage and can be  assessed as this clears.  Prominent white matter type changes asymmetric greater on the left  more conspicuous on the present exam than on the prior exam. The  patient may benefit from followup MR and MR angiogram for further  delineation. This may be ischemic in origin possibly related to  watershed type infarct. Other causes not excluded although felt to  be less likely.  02/24/2014 Stable appearance of the hematoma within the right basal ganglia. No new or progressive findings.  02/22/2014   1. No significant interval change in the patient's intraparenchymal hemorrhage at the right lateral basal ganglia, measuring 4.1 x 1.2 cm, with mild surrounding vasogenic edema. Mild associated mass effect, with 4 mm of leftward midline shift again seen. 2. Scattered small vessel ischemic microangiopathy. 3. Bilateral proptosis noted.     02/21/2014   1. Acute hemorrhage into the lateral right basal ganglia, likely indicating a hypertensive hemorrhage/hemorrhagic stroke. 2. Mild mass effect with shift of the midline to the left approximating 5 mm. Critical   US Renal Port 02/22/2014    1. Small echogenic kidneys consistent with chronic renal medical disease. No hydronephrosis. 2. The urinary bladder is decompressed by Foley catheter and cannot be evaluated.     Dg Chest Port 1 View 02/22/2014    1. Satisfactory positioning of endotracheal tube and right IJ line. 2. Cardiomegaly with persistent diffuse interstitial and airspace disease likely reflecting edema and congestive heart failure. 3. Bilateral pleural effusions and basilar airspace disease. This likely reflects atelectasis.    02/21/2014    Mild cardiac enlargement and possible asymmetric pulmonary edema. However, exam is limited by body habitus.     2D echo - - Left ventricle: The cavity size was normal. There was severe concentric hypertrophy. Systolic function was normal. The estimated ejection fraction  was in the range of 60% to 65%. Wall motion was normal; there were no regional wall motion abnormalities. - Left atrium: The atrium was mildly dilated. - Pulmonary arteries: PA peak pressure: 43 mm Hg (S). - Pericardium, extracardiac: A small, free-flowing pericardial effusion was identified posterior to the heart and circumferential to the heart. The fluid had no internal echoes.There was no evidence of hemodynamic compromise. Impressions: - The right ventricular systolic pressure was increased consistent with moderate pulmonary hypertension.  CUS - - Findings consistent with 1-39 percent stenosis involving the right internal carotid artery and the left internal carotid artery. - Right vertebral artery not imaged due to central line dressing. Left vertebral artery not imaged due to patient position.  DVT - - No obvious evidence of deep vein or superficial thrombosis involving the right lower extremity and left lower extremity. - No evidence of Baker&'s cyst on the right or left.  EEG 02/28/14 - This EEG is abnormal with severe generalized slowing of cerebral activity consistent with severe encephalopathic state. These findings are nonspecific and can be seen with metabolic as well as toxic and degenerative encephalopathies. No evidence of an epileptic disorder was demonstrated.  A1C 5.8 and LDL 92  PHYSICAL EXAM Young obese Caucasian lady not in distress. On trach collar  . Afebrile. Head is nontraumatic. Neck is supple without bruit. Cardiac exam soft ejection murmur RRR .no gallop. Lungs bilateral crackles and  rhonchi to auscultation. Distal pulses are well felt.   Neurological Exam: on trach collar now, eyes open on voice but not tracking and not following commands. Eyes in the middle. Pupils 3 mm reactive. Fundi not visualized. Left lower face weakness, corneal and gag present. On pain stimulation, she had trace withdraw to pain on RUE and RLE and LUE and more on LLE. Left Plantar  upgoing and right down going.  ASSESSMENT/PLAN Ms. Kathlee NationsStephanie Marsala is a 43 y.o. female with no documented medical history found with new onset slurred speech and left-sided weakness. CT showed right external capsule hemorrhage. However, her condition getting worse with pulmonary edema, AKI requiring HD, respiratory failure needing ventilation. Transferred pt to CCM for better management.  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  EEG showed severe slowing but no seizure.  2D echo unremarkable  Carotid doppler unremarkable  HgbA1c 5.8  Heparin subq for VTE prophylaxis  Resultant VDRF, left hemiparesis  no antithrombotics prior to admission, currently not on antithrombotics due to hemorrhage.  Ongoing aggressive risk factor management  Clinical improving  Acute Respiratory Failure with severe acute pulmonary edema  trach on 03/02/14  CCM following  Diuresing, IV Lasix, Hemodialysis, urine output minimal today  CXR series  Malignant Hypertension  BP 226/111 at onset  Home meds:   none  SBP goal < 150 due to presence of end organ damage  On amlodipine, clonidine patch and metoprolol and hydralazine  off cardene   Acute renal failure with volume overload, suspect baseline chronic kidney disease  Diuresis and management per critical care, on HD  Continue to trend Cre.  HD as per nephrology  Other Stroke Risk Factors ETOH use   Morbid Obesity, Body mass index is  42.55 kg/(m^2).   Hospital day # 14 03/07/2014 3:48 PM   D/w Dr Kendrick FriesMcQuaid. Stroke service will follow from a distance. Call when MRI done  Delia HeadyPramod Sethi, MD Stroke Neurology 03/07/2014 3:48 PM   To contact Stroke Continuity provider, please refer to [email or web address]. After hours, contact General Neurology

## 2014-03-07 NOTE — Progress Notes (Signed)
ANTIBIOTIC CONSULT NOTE - FOLLOW UP  Pharmacy Consult for Vancomycin Indication: pneumonia  No Known Allergies  Patient Measurements: Height: 5\' 4"  (162.6 cm) Weight: 248 lb 0.3 oz (112.5 kg) IBW/kg (Calculated) : 54.7  Vital Signs: Temp: 98.7 F (37.1 C) (10/20 0755) Temp Source: Oral (10/20 0755) BP: 135/72 mmHg (10/20 0840) Pulse Rate: 84 (10/20 0840) Intake/Output from previous day: 10/19 0701 - 10/20 0700 In: 1170 [NG/GT:480; IV Piggyback:150] Out: 5100 [Urine:100] Intake/Output from this shift:    Labs:  Recent Labs  03/06/14 0430 03/07/14 0530 03/07/14 0845  WBC 11.1*  --  9.0  HGB 7.4*  --  7.8*  PLT 267  --  263  CREATININE 9.14* 5.98*  --    Estimated Creatinine Clearance: 14.9 ml/min (by C-G formula based on Cr of 5.98). No results found for this basename: VANCOTROUGH, Leodis Binet, VANCORANDOM, GENTTROUGH, GENTPEAK, GENTRANDOM, TOBRATROUGH, TOBRAPEAK, TOBRARND, AMIKACINPEAK, AMIKACINTROU, AMIKACIN,  in the last 72 hours   Microbiology: Recent Results (from the past 720 hour(s))  MRSA PCR SCREENING     Status: Abnormal   Collection Time    02/21/14  9:37 PM      Result Value Ref Range Status   MRSA by PCR POSITIVE (*) NEGATIVE Final   Comment:            The GeneXpert MRSA Assay (FDA     approved for NASAL specimens     only), is one component of a     comprehensive MRSA colonization     surveillance program. It is not     intended to diagnose MRSA     infection nor to guide or     monitor treatment for     MRSA infections.     RESULT CALLED TO, READ BACK BY AND VERIFIED WITH:     M.BOULDEN,RN 0154 02/22/14 M.CAMPBELL  CULTURE, BLOOD (ROUTINE X 2)     Status: None   Collection Time    03/03/14 10:02 AM      Result Value Ref Range Status   Specimen Description BLOOD RIGHT HAND   Final   Special Requests BOTTLES DRAWN AEROBIC ONLY 3CC   Final   Culture  Setup Time     Final   Value: 03/03/2014 17:08     Performed at [facility]   Culture     Final   Value:        BLOOD CULTURE RECEIVED NO GROWTH TO DATE CULTURE WILL BE HELD FOR 5 DAYS BEFORE ISSUING A FINAL NEGATIVE REPORT     Performed at [facility]   Report Status PENDING   Incomplete  CULTURE, BLOOD (ROUTINE X 2)     Status: None   Collection Time    03/03/14  2:30 PM      Result Value Ref Range Status   Specimen Description BLOOD HEMODIALYSIS CATHETER   Final   Special Requests BOTTLES DRAWN AEROBIC AND ANAEROBIC 10CC   Final   Culture  Setup Time     Final   Value: 03/03/2014 20:41     Performed at [facility]   Culture     Final   Value:        BLOOD CULTURE RECEIVED NO GROWTH TO DATE CULTURE WILL BE HELD FOR 5 DAYS BEFORE ISSUING A FINAL NEGATIVE REPORT     Performed at [facility]   Report Status PENDING   Incomplete  CULTURE, RESPIRATORY (NON-EXPECTORATED)     Status: None   Collection  Time    03/04/14  9:30 AM      Result Value Ref Range Status   Specimen Description TRACHEAL ASPIRATE   Final   Special Requests NONE   Final   Gram Stain     Final   Value: ABUNDANT WBC PRESENT,BOTH PMN AND MONONUCLEAR     FEW SQUAMOUS EPITHELIAL CELLS PRESENT     MODERATE GRAM POSITIVE COCCI IN PAIRS     FEW GRAM POSITIVE RODS     Performed at [facility]   Culture     Final   Value: ABUNDANT STAPHYLOCOCCUS AUREUS     Note: RIFAMPIN AND GENTAMICIN SHOULD NOT BE USED AS SINGLE DRUGS FOR TREATMENT OF STAPH INFECTIONS.     Performed at [facility]   Report Status PENDING   Incomplete  CLOSTRIDIUM DIFFICILE BY PCR     Status: None   Collection Time    03/06/14  3:00 AM      Result Value Ref Range Status   C difficile by pcr NEGATIVE  NEGATIVE Final    Anti-infectives   Start     Dose/Rate Route Frequency Ordered Stop   03/07/14 1130  vancomycin (VANCOCIN) 1,500 mg in sodium chloride 0.9 % 500 mL IVPB     1,500 mg 250 mL/hr over 120 Minutes Intravenous  Once 03/07/14 1121     03/06/14 1200  vancomycin (VANCOCIN)  IVPB 1000 mg/200 mL premix  Status:  Discontinued     1,000 mg 200 mL/hr over 60 Minutes Intravenous Every M-W-F (Hemodialysis) 03/05/14 1338 03/06/14 0902   03/06/14 0600  ceFAZolin (ANCEF) IVPB 2 g/50 mL premix    Comments:  hang ON CALL to xray 10/19   2 g 100 mL/hr over 30 Minutes Intravenous On call 03/04/14 1117 03/06/14 0726   03/04/14 1600  vancomycin (VANCOCIN) IVPB 1000 mg/200 mL premix     1,000 mg 200 mL/hr over 60 Minutes Intravenous  Once 03/04/14 1502 03/04/14 1654   03/03/14 1000  piperacillin-tazobactam (ZOSYN) IVPB 2.25 g  Status:  Discontinued     2.25 g 100 mL/hr over 30 Minutes Intravenous 3 times per day 03/03/14 0917 03/07/14 1121   03/03/14 1000  vancomycin (VANCOCIN) 1,500 mg in sodium chloride 0.9 % 500 mL IVPB     1,500 mg 250 mL/hr over 120 Minutes Intravenous  Once 03/03/14 0918 03/03/14 1213   03/02/14 1300  fluconazole (DIFLUCAN) IVPB 200 mg  Status:  Discontinued     200 mg 100 mL/hr over 60 Minutes Intravenous Every 24 hours 03/02/14 1205 03/04/14 0932      43 yo F admitted 02/21/2014  with Hypertensive emergency + Intracerebral hemorrhage, no PMH.  Pharmacy consulted to dose antibiotics for pneumonia.  AC: None PTA, heparin SQ for VTE proph  ID:  PNA d#5 of tx- Afebrile, WBC wnl, 10/16 Vanc > >10/19, but now +SA, will give vanc 10/20 and then one dose after HD 10/21 to complete 7d course * Vanc 1500 mg LD given on 10/16 (true LD 2.5g), short HD session on 10/16 evening however will still give 1g to help complete load 10/16 Zosyn >>10/20 10/15 Fluconazole >> 10/17  10/17 BCx >>NGTD 10/17 RCx (TA) >> staph aureus 10/19 CDiff - NEG  CV: Admitted with HTN emerg - BP 126/70, HR 80 - amlodipine, clonidine, hydralazine, metoprolol - lipid panel WNL  Endo: No hx (A1c 5.8) - CBGs good on SSI  GI/Nutr:  PEG; Vital HP - LFTs WNL, PPI  Neuro: ICH d/t  malignant HTN - fent patch, GCS 10, RASS -1 (goal 0)  Renal: AKI - last HD 10/16, phos elevated at  10.8, other lytes ok - darbe  Pulm: 100% TC   Heme/Onc: H/H 7.4/23.8, plts 267 - darbe, feraheme 10/9 (anemia panel w/ low iron)  Best Practices: hep sq, ppi, mc  Plan 1. DC zosyn 2.25gm IV Q8H 2. Vancomycin 1500 mg IV x1 today and give after HD 10/21 to complete 7d course of antibiotics for SA PNA  Thank you for allowing pharmacy to be a part of this patients care team.  Lovenia KimJulie Alliyah Roesler Pharm.D., BCPS, AQ-Cardiology Clinical Pharmacist 03/07/2014 11:26 AM Pager: [phone] Phone: [phone]

## 2014-03-07 NOTE — Care Management Note (Deleted)
    Page 1 of 2   03/22/2014     8:11:30 AM CARE MANAGEMENT NOTE 03/22/2014  Patient:  Lynn Gentry,Lynn Gentry   Account Number:  [id number]  Date Initiated:  03/07/2014  Documentation initiated by:  DOWELL,DEBBIE  Subjective/Objective Assessment:   adm w stroke     Action/Plan:   lives w husband   Anticipated DC Date:     Anticipated DC Plan:    In-house referral  Clinical Social Worker  Hospice / Palliative Care      DC Planning Services  CM consult      Choice offered to / List presented to:             Status of service:   Medicare Important Message given?   (If response is "NO", the following Medicare IM given date fields will be blank) Date Medicare IM given:   Medicare IM given by:   Date Additional Medicare IM given:   Additional Medicare IM given by:    Discharge Disposition:    Per UR Regulation:  Reviewed for med. necessity/level of care/duration of stay  If discussed at Long Length of Stay Meetings, dates discussed:   03/09/2014  03/14/2014  03/16/2014  03/21/2014    Comments:  03/21/14 1300 Henrietta Mayo RN MSN BSN CCM Received TC that insurance has authorized xfer to [facility]. TC to spouse, also talked with dtr (RN @ [facility]) re plan.  03/17/14 0959 Henrietta Mayo RN MSN BSN CCM Per PCCM, LTAC was not approved and pt not weanable, may need vent SNF.  CSW following.  [facility] seeking auth for LTAC bed, documents faxed as requested.  10/28  1215p debbie dowell rn,bsn met w husband to let him know his [facility] ins did not approve ltac. sw will meet w fam to discuss vent-trach-dialysis facility search. will ask dr yacoub to speak w med advi of [facility] to see if he can appeal decision but md feels he may not be able to convince them to approve ltac. will cont to follow.  10/28  0938 debbie dowell rn,bsn recieved inform from [facility] late on 10/27 they had to wait til pt on vent 21 days before they could start auth. [employer] has denied ltac. will need to  let husband know that ltac not option and sw will need to work on vent snf that takes dialysis. sw will try and talk w fam today also. alerted dr yacoub that ltac has been denied by [facility] ins.  10/26  1421 debbie dowell rn,bsn [facility] is following along. they cannot submit for prior auth until pt on vent for 21 days. have also made sw consult in case ins does not approve ltac and vent snf needed.  10/22  1451 debbie dowell rn,bsn dr mcqaid and pal care to see husb. spoke w husb and introduced myself and role of case manager. husb hopes that pt can go to [facility] but aware ins will need to be checked for benefits. jenny w [facility] ck on benefits.  10/22  1041a debbie dowell rn,bsn will try and speak w husb when he visits. has trach and peg. on vent at nite. will get ltac to see if any benefits. have made sw consult for poss trach-vent -snf.  10/20 1146a debbie dowell rn,bsn pt transf to [facility] on 10/19. pt has trach and weaning vent. will make sw ref and follow. jayne at [facility] for dc planning [phone]   

## 2014-03-07 NOTE — Progress Notes (Signed)
Patient ID: Lynn Gentry, female    DOB: January 06, 1971, 43 y.o.   MRN: [id number]  S: intubated, opens eyes minimally.  O:BP 123/68  Pulse 72  Temp(Src) 98.7 F (37.1 C) (Oral)  Resp 15  Ht 5\' 4"  (1.626 m)  Wt 248 lb 0.3 oz (112.5 kg)  BMI 42.55 kg/m2  SpO2 100%  LMP 03/01/2014  Intake/Output Summary (Last 24 hours) at 03/07/14 0802 Last data filed at 03/07/14 0600  Gross per 24 hour  Intake   1150 ml  Output   5100 ml  Net  -3950 ml   Intake/Output: I/O last 3 completed shifts: In: 1680 [Other:560; NG/GT:920; IV Piggyback:200] Out: 5185 [Urine:185; Other:5000]  Intake/Output this shift:    Weight change: 11 lb 11 oz (5.3 kg) Gen:NAD, intubated with trach CVS: RRR, no mrg Resp: transmitted upper airway Abd: obese, soft, +BS Ext: trace edema.   Recent Labs Lab 03/01/14 0500 03/02/14 0500 03/03/14 0500 03/04/14 0319 03/06/14 0430 03/07/14 0530  NA 139 141 138 138 139 140  K 3.6* 4.2 4.5 3.9 4.8 3.8  CL 98 100 96 97 93* 96  CO2 20 23 23 23 21 25   GLUCOSE 135* 128* 117* 136* 113* 124*  BUN 124* 77* 102* 92* 152* 80*  CREATININE 6.58* 4.63* 6.56* 5.99* 9.14* 5.98*  ALBUMIN  --   --   --   --  2.0* 2.1*  CALCIUM 9.2 8.9 9.1 8.5 8.6 8.5  PHOS 5.6* 3.8 7.0* 6.1* 10.8* 6.0*   Liver Function Tests:  Recent Labs Lab 03/06/14 0430 03/07/14 0530  ALBUMIN 2.0* 2.1*   No results found for this basename: LIPASE, AMYLASE,  in the last 168 hours No results found for this basename: AMMONIA,  in the last 168 hours CBC:  Recent Labs Lab 03/01/14 0500 03/02/14 0500 03/03/14 0500 03/04/14 0319 03/06/14 0430  WBC 13.3* 14.2* 15.1* 14.8* 11.1*  NEUTROABS  --   --   --   --  8.6*  HGB 8.0* 8.3* 8.3* 7.6* 7.4*  HCT 24.7* 26.1* 26.7* 24.1* 23.8*  MCV 85.8 87.0 90.5 88.3 90.8  PLT 268 293 281 242 267   Cardiac Enzymes: No results found for this basename: CKTOTAL, CKMB, CKMBINDEX, TROPONINI,  in the last 168 hours CBG:  Recent Labs Lab 03/06/14 1158  03/06/14 1622 03/06/14 2143 03/07/14 0004 03/07/14 0400  GLUCAP 130* 108* 99 82 109*    Iron Studies: No results found for this basename: IRON, TIBC, TRANSFERRIN, FERRITIN,  in the last 72 hours Studies/Results: Dg Chest Port 1 View  03/06/2014   CLINICAL DATA:  43 year old female with respiratory failure  EXAM: PORTABLE CHEST - 1 VIEW  COMPARISON:  Multiple prior comparison most recent 03/05/2014  FINDINGS: Heart size is unchanged, with cardiomegaly.  Decreasing prominence of the central pulmonary vasculature. Decreasing prominence of interlobular septal thickening over the course of several examinations.  No pneumothorax.  Bases of the lungs not well visualized.  Interval placement of endotracheal tube with a new tracheostomy. Unchanged left IJ central venous catheter, and right IJ central catheter.  IMPRESSION: Improving interstitial edema and pulmonary congestion over the course of several examinations, relatively unchanged from the most recent comparison.  Interval placement of a new tracheostomy, with removal of endotracheal tube. Otherwise unchanged support apparatus.  Signed,  Yvone Neu. Loreta Ave, DO  [facility]  [facility]   Electronically Signed   By: Gilmer Mor D.O.   On: 03/06/2014 08:02   Dg Chest Port 1  View  03/05/2014   CLINICAL DATA:  Congestive heart failure with pulmonary edema.  EXAM: PORTABLE CHEST - 1 VIEW  COMPARISON:  03/03/2014.  FINDINGS: A poor inspiration is again demonstrated with stable enlargement of the cardiac silhouette. The bilateral jugular catheters are unchanged. The tracheostomy tube is in satisfactory position. Decreased prominence of the pulmonary vasculature. Probable positional scoliosis.  IMPRESSION: No acute abnormality. Improved pulmonary vascular congestion with stable cardiomegaly.   Electronically Signed   By: Gordan PaymentSteve  Reid M.D.   On: 03/05/2014 17:28   . alteplase  2 mg Intracatheter Once  . amLODipine   10 mg Per Tube Daily  . antiseptic oral rinse  7 mL Mouth Rinse QID  . chlorhexidine  15 mL Mouth Rinse BID  . cloNIDine  0.3 mg Transdermal Q Mon  . darbepoetin (ARANESP) injection - DIALYSIS  100 mcg Intravenous Q Mon-HD  . feeding supplement (PRO-STAT SUGAR FREE 64)  60 mL Per Tube BID  . feeding supplement (VITAL HIGH PROTEIN)  1,000 mL Per Tube Q24H  . fentaNYL  100 mcg Transdermal Q72H  . heparin subcutaneous  5,000 Units Subcutaneous 3 times per day  . hydrALAZINE  50 mg Per Tube 4 times per day  . insulin aspart  0-15 Units Subcutaneous 6 times per day  . metoprolol tartrate  50 mg Per Tube BID  . multivitamin  5 mL Per Tube Daily  . pantoprazole sodium  40 mg Per Tube Q1200  . piperacillin-tazobactam (ZOSYN)  IV  2.25 g Intravenous 3 times per day  . senna-docusate  1 tablet Per Tube BID    BMET    Component Value Date/Time   NA 140 03/07/2014 0530   K 3.8 03/07/2014 0530   CL 96 03/07/2014 0530   CO2 25 03/07/2014 0530   GLUCOSE 124* 03/07/2014 0530   BUN 80* 03/07/2014 0530   CREATININE 5.98* 03/07/2014 0530   CALCIUM 8.5 03/07/2014 0530   GFRNONAA 8* 03/07/2014 0530   GFRAA 9* 03/07/2014 0530   CBC    Component Value Date/Time   WBC 11.1* 03/06/2014 0430   RBC 2.62* 03/06/2014 0430   HGB 7.4* 03/06/2014 0430   HCT 23.8* 03/06/2014 0430   PLT 267 03/06/2014 0430   MCV 90.8 03/06/2014 0430   MCH 28.2 03/06/2014 0430   MCHC 31.1 03/06/2014 0430   RDW 17.3* 03/06/2014 0430   LYMPHSABS 1.3 03/06/2014 0430   MONOABS 1.2* 03/06/2014 0430   EOSABS 0.0 03/06/2014 0430   BASOSABS 0.0 03/06/2014 0430   Assessment/Plan: 1. AKI vs. Subacute vs. AKI/CKD- oliguric requiring dialysis. S/p HD treatments with marked improvement of volume and BP  1. Cont with HD MWF 2. Remains oliguric and dialysis-dependent  2. Vascular access: L IJ currently. will need tunneled HD cath per IR. 3. 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 phos remains elevated, recommend starting phos  binder when tolerating PO 5. Fevers- per Dr. Sung AmabileSimonds, believes this to be related to respiratory infection and will check CT scan. On abx. R BG hemorrhagic stroke- neurosurgery following Stable by repeat CT scan 02/28/14 6. VDRF- per PCCM  7. Malignant HTN and volume overload- improving with aggressive HD and UF.    Tawni CarnesWight, Andrew

## 2014-03-08 ENCOUNTER — Inpatient Hospital Stay (HOSPITAL_COMMUNITY): Payer: 59

## 2014-03-08 LAB — CBC WITH DIFFERENTIAL/PLATELET
Basophils Absolute: 0.1 10*3/uL (ref 0.0–0.1)
Basophils Relative: 1 % (ref 0–1)
EOS ABS: 0.2 10*3/uL (ref 0.0–0.7)
Eosinophils Relative: 2 % (ref 0–5)
HEMATOCRIT: 24.4 % — AB (ref 36.0–46.0)
Hemoglobin: 7.6 g/dL — ABNORMAL LOW (ref 12.0–15.0)
LYMPHS ABS: 1.3 10*3/uL (ref 0.7–4.0)
Lymphocytes Relative: 13 % (ref 12–46)
MCH: 28.4 pg (ref 26.0–34.0)
MCHC: 31.1 g/dL (ref 30.0–36.0)
MCV: 91 fL (ref 78.0–100.0)
Monocytes Absolute: 1.3 10*3/uL — ABNORMAL HIGH (ref 0.1–1.0)
Monocytes Relative: 13 % — ABNORMAL HIGH (ref 3–12)
NEUTROS ABS: 7.2 10*3/uL (ref 1.7–7.7)
Neutrophils Relative %: 71 % (ref 43–77)
Platelets: 284 10*3/uL (ref 150–400)
RBC: 2.68 MIL/uL — ABNORMAL LOW (ref 3.87–5.11)
RDW: 17.8 % — AB (ref 11.5–15.5)
WBC: 10.1 10*3/uL (ref 4.0–10.5)

## 2014-03-08 LAB — RENAL FUNCTION PANEL
ALBUMIN: 2 g/dL — AB (ref 3.5–5.2)
Anion gap: 18 — ABNORMAL HIGH (ref 5–15)
BUN: 99 mg/dL — ABNORMAL HIGH (ref 6–23)
CO2: 25 mEq/L (ref 19–32)
Calcium: 8.7 mg/dL (ref 8.4–10.5)
Chloride: 97 mEq/L (ref 96–112)
Creatinine, Ser: 7.92 mg/dL — ABNORMAL HIGH (ref 0.50–1.10)
GFR calc Af Amer: 6 mL/min — ABNORMAL LOW (ref >90)
GFR calc non Af Amer: 6 mL/min — ABNORMAL LOW (ref >90)
Glucose, Bld: 118 mg/dL — ABNORMAL HIGH (ref 70–99)
PHOSPHORUS: 8.9 mg/dL — AB (ref 2.3–4.6)
Potassium: 4.4 mEq/L (ref 3.7–5.3)
SODIUM: 140 meq/L (ref 137–147)

## 2014-03-08 LAB — CULTURE, RESPIRATORY W GRAM STAIN

## 2014-03-08 LAB — GLUCOSE, CAPILLARY
GLUCOSE-CAPILLARY: 122 mg/dL — AB (ref 70–99)
Glucose-Capillary: 109 mg/dL — ABNORMAL HIGH (ref 70–99)
Glucose-Capillary: 117 mg/dL — ABNORMAL HIGH (ref 70–99)
Glucose-Capillary: 113 mg/dL — ABNORMAL HIGH (ref 70–99)
Glucose-Capillary: 150 mg/dL — ABNORMAL HIGH (ref 70–99)
Glucose-Capillary: 110 mg/dL — ABNORMAL HIGH (ref 70–99)

## 2014-03-08 LAB — CULTURE, RESPIRATORY

## 2014-03-08 MED ORDER — CEFAZOLIN SODIUM-DEXTROSE 2-3 GM-% IV SOLR
2.0000 g | INTRAVENOUS | Status: DC
  Administered 2014-03-10: 2 g via INTRAVENOUS
  Filled 2014-03-08 (×2): qty 50

## 2014-03-08 MED ORDER — NEPRO/CARBSTEADY PO LIQD
1000.0000 mL | ORAL | Status: DC
  Administered 2014-03-08 – 2014-03-12 (×4): 1000 mL
  Filled 2014-03-08 (×7): qty 1000

## 2014-03-08 MED ORDER — CEFAZOLIN SODIUM-DEXTROSE 2-3 GM-% IV SOLR
2.0000 g | Freq: Once | INTRAVENOUS | Status: AC
  Administered 2014-03-08: 2 g via INTRAVENOUS
  Filled 2014-03-08 (×2): qty 50

## 2014-03-08 NOTE — Progress Notes (Signed)
Pt to dialysis, report given

## 2014-03-08 NOTE — Progress Notes (Signed)
Tube feeding to be started as soon as available from pharmacy, passed on in shift report.

## 2014-03-08 NOTE — Progress Notes (Signed)
NUTRITION FOLLOW UP  Intervention:   -D/c Prostat -Change TF regimen to Nepro @ 40 ml/hr via PEG.   -Tube feeding regimen provides 1728 kcal (96% of needs), 78 grams of protein, and 698 ml of H2O.   Nutrition Dx:   Inadequate oral intake related to inability to eat as evidenced by NPO status; ongoing.   Goal:   Enteral nutrition to provide 60-70% of estimated calorie needs (22-25 kcals/kg ideal body weight) and 100% of estimated protein needs, based on ASPEN guidelines for permissive underfeeding in critically ill obese individuals; met.  Monitor:   Respiratory status, HD, TF tolerance, labs  Assessment:   Pt admitted 10/6 with left sided weakness, slurred speech, inability to stand up. In ED, found to have acute lateral right basal ganglia ICH with mild mass effect and 19m MLS. Pt developed respiratory failure and was intubated.  S/p HD cath placement on 03/07/14. Last HD done on 03/06/14 with 5000 ml fluid removed. Plan for HD today.  S/p MRI today, for which pt required full vent support. Pt with trach and PEG placed on 03/02/14. Pt is currently on trach collar and not requiring vent at this time. Needs re-estimated due to transition off the vent.  Pt remains on TF regimen of Vital High Protein at 40 ml/hr and 60 ml Prostat BID. Tube feeding regimen provides 1360 kcal (76% of needs), 144 grams of protein, and 802 ml of H2O.  Labs reviewed. CBGs: 109-113. BUN/Creat improving (99/7.92). Glucose: 118. Phos remains elevated (8.9), despite initiation of HD. Current TF regimen provides 768 mg of Phosphorus. Will change TF to Nepro in order to better meet re-estimated needs and decrease phosphorus content of TF.   Height: Ht Readings from Last 1 Encounters:  02/21/14 5' 4"  (1.626 m)    Weight Status:   Wt Readings from Last 1 Encounters:  03/08/14 225 lb 15.5 oz (102.5 kg)   03/03/14  253 lb 4.9 oz (114.9 kg)   Admission weight: 276 lb (125.5 kg) 10/6  Re-estimated needs:  Kcal:  1800-2000 Protein: 74-84 grams Fluid: 1-1.5 L  Skin: WDL, LUQ PEG  Diet Order:  NPO   Intake/Output Summary (Last 24 hours) at 03/08/14 1024 Last data filed at 03/08/14 0800  Gross per 24 hour  Intake   1260 ml  Output      0 ml  Net   1260 ml    Last BM: 03/05/14   Labs:   Recent Labs Lab 03/02/14 0500 03/03/14 0500 03/04/14 0319 03/06/14 0430 03/07/14 0530 03/08/14 0445  NA 141 138 138 139 140 140  K 4.2 4.5 3.9 4.8 3.8 4.4  CL 100 96 97 93* 96 97  CO2 23 23 23 21 25 25   BUN 77* 102* 92* 152* 80* 99*  CREATININE 4.63* 6.56* 5.99* 9.14* 5.98* 7.92*  CALCIUM 8.9 9.1 8.5 8.6 8.5 8.7  MG 2.3 2.5 2.4  --   --   --   PHOS 3.8 7.0* 6.1* 10.8* 6.0* 8.9*  GLUCOSE 128* 117* 136* 113* 124* 118*    CBG (last 3)   Recent Labs  03/08/14 0119 03/08/14 0439 03/08/14 0737  GLUCAP 109* 117* 113*    Scheduled Meds: . amLODipine  10 mg Per Tube Daily  . antiseptic oral rinse  7 mL Mouth Rinse QID  . chlorhexidine  15 mL Mouth Rinse BID  . cloNIDine  0.3 mg Transdermal Q Mon  . darbepoetin (ARANESP) injection - DIALYSIS  100 mcg Intravenous Q Mon-HD  .  feeding supplement (PRO-STAT SUGAR FREE 64)  60 mL Per Tube BID  . feeding supplement (VITAL HIGH PROTEIN)  1,000 mL Per Tube Q24H  . fentaNYL  100 mcg Transdermal Q72H  . heparin subcutaneous  5,000 Units Subcutaneous 3 times per day  . hydrALAZINE  50 mg Per Tube 4 times per day  . insulin aspart  0-15 Units Subcutaneous 6 times per day  . metoprolol tartrate  50 mg Per Tube BID  . multivitamin  5 mL Per Tube Daily  . pantoprazole sodium  40 mg Per Tube Q1200  . senna-docusate  1 tablet Per Tube BID    Continuous Infusions:   Makari Portman A. Jimmye Norman, RD, LDN Pager: [phone] After hours Pager: [phone]

## 2014-03-08 NOTE — Progress Notes (Signed)
Patient ID: Lynn Gentry, female    DOB: 1970/11/27, 43 y.o.   MRN: [id number]  S: Opens eyes briefly in response to voice/touch.   O:BP 128/59  Pulse 86  Temp(Src) 98.2 F (36.8 C) (Oral)  Resp 21  Ht 5\' 4"  (1.626 m)  Wt 225 lb 15.5 oz (102.5 kg)  BMI 38.77 kg/m2  SpO2 98%  LMP 03/01/2014  Intake/Output Summary (Last 24 hours) at 03/08/14 0759 Last data filed at 03/08/14 0600  Gross per 24 hour  Intake   1300 ml  Output      0 ml  Net   1300 ml   Intake/Output: I/O last 3 completed shifts: In: 2010 [I.V.:100; Other:380; NG/GT:1480; IV Piggyback:50] Out: 5000 [Other:5000]  Intake/Output this shift:    Weight change: -32 lb 13.6 oz (-14.9 kg) Gen:NAD, trach in place CVS: RRR, no mrg Resp: transmitted upper airway Abd: obese, soft, +BS Ext: trace edema.   Recent Labs Lab 03/02/14 0500 03/03/14 0500 03/04/14 0319 03/06/14 0430 03/07/14 0530 03/08/14 0445  NA 141 138 138 139 140 140  K 4.2 4.5 3.9 4.8 3.8 4.4  CL 100 96 97 93* 96 97  CO2 23 23 23 21 25 25   GLUCOSE 128* 117* 136* 113* 124* 118*  BUN 77* 102* 92* 152* 80* 99*  CREATININE 4.63* 6.56* 5.99* 9.14* 5.98* 7.92*  ALBUMIN  --   --   --  2.0* 2.1* 2.0*  CALCIUM 8.9 9.1 8.5 8.6 8.5 8.7  PHOS 3.8 7.0* 6.1* 10.8* 6.0* 8.9*   Liver Function Tests:  Recent Labs Lab 03/06/14 0430 03/07/14 0530 03/08/14 0445  ALBUMIN 2.0* 2.1* 2.0*   No results found for this basename: LIPASE, AMYLASE,  in the last 168 hours No results found for this basename: AMMONIA,  in the last 168 hours CBC:  Recent Labs Lab 03/03/14 0500 03/04/14 0319 03/06/14 0430 03/07/14 0845 03/08/14 0445  WBC 15.1* 14.8* 11.1* 9.0 10.1  NEUTROABS  --   --  8.6*  --  PENDING  HGB 8.3* 7.6* 7.4* 7.8* 7.6*  HCT 26.7* 24.1* 23.8* 24.7* 24.4*  MCV 90.5 88.3 90.8 90.5 91.0  PLT 281 242 267 263 284   Cardiac Enzymes: No results found for this basename: CKTOTAL, CKMB, CKMBINDEX, TROPONINI,  in the last 168 hours CBG:  Recent  Labs Lab 03/07/14 1233 03/07/14 1632 03/07/14 2047 03/08/14 0119 03/08/14 0439  GLUCAP 129* 113* 121* 109* 117*    Iron Studies: No results found for this basename: IRON, TIBC, TRANSFERRIN, FERRITIN,  in the last 72 hours Studies/Results:  . amLODipine  10 mg Per Tube Daily  . antiseptic oral rinse  7 mL Mouth Rinse QID  . chlorhexidine  15 mL Mouth Rinse BID  . cloNIDine  0.3 mg Transdermal Q Mon  . darbepoetin (ARANESP) injection - DIALYSIS  100 mcg Intravenous Q Mon-HD  . feeding supplement (PRO-STAT SUGAR FREE 64)  60 mL Per Tube BID  . feeding supplement (VITAL HIGH PROTEIN)  1,000 mL Per Tube Q24H  . fentaNYL  100 mcg Transdermal Q72H  . heparin subcutaneous  5,000 Units Subcutaneous 3 times per day  . hydrALAZINE  50 mg Per Tube 4 times per day  . insulin aspart  0-15 Units Subcutaneous 6 times per day  . metoprolol tartrate  50 mg Per Tube BID  . multivitamin  5 mL Per Tube Daily  . pantoprazole sodium  40 mg Per Tube Q1200  . senna-docusate  1 tablet Per Tube BID  BMET    Component Value Date/Time   NA 140 03/08/2014 0445   K 4.4 03/08/2014 0445   CL 97 03/08/2014 0445   CO2 25 03/08/2014 0445   GLUCOSE 118* 03/08/2014 0445   BUN 99* 03/08/2014 0445   CREATININE 7.92* 03/08/2014 0445   CALCIUM 8.7 03/08/2014 0445   GFRNONAA 6* 03/08/2014 0445   GFRAA 6* 03/08/2014 0445   CBC    Component Value Date/Time   WBC 10.1 03/08/2014 0445   RBC 2.68* 03/08/2014 0445   HGB 7.6* 03/08/2014 0445   HCT 24.4* 03/08/2014 0445   PLT 284 03/08/2014 0445   MCV 91.0 03/08/2014 0445   MCH 28.4 03/08/2014 0445   MCHC 31.1 03/08/2014 0445   RDW 17.8* 03/08/2014 0445   LYMPHSABS PENDING 03/08/2014 0445   MONOABS PENDING 03/08/2014 0445   EOSABS PENDING 03/08/2014 0445   BASOSABS PENDING 03/08/2014 0445   Assessment/Plan: 43 y.o. female with no significant known PMH admitted for right basal ganglia hemorrhage, underwent intubation for declining respiratory status and  oliguric, started on HD.  1. AKI vs. Subacute vs. AKI/CKD- oliguric requiring dialysis. S/p HD treatments with marked improvement of volume and BP  1. Cont with HD MWF, net UF goal today 3L 2. Remains oliguric and dialysis-dependent  2. Vascular access: TDC placed left IJ 10/20. 3. 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 Elevated phos. 5. R BG hemorrhagic stroke- MRI 10/21 with stable hematoma lentiform nucleas, acute left MCA infarct, small infarcts right frontal, parieto-occiptal, left pons 6. VDRF- per PCCM  7. Malignant HTN and volume overload- improving with aggressive HD and UF.    Lynn Gentry  Renal Attending: She has small echogenic kidneys by ultrasound suggesting ESRD.  5 liters off last HD treatment 2 days ago.  Plan HD today.  Lynnett Langlinais C

## 2014-03-08 NOTE — Progress Notes (Signed)
PULMONARY / CRITICAL CARE MEDICINE   Name: Lynn Gentry MRN: [id number] DOB: 1971-05-10    ADMISSION DATE:  02/21/2014 CONSULTATION DATE:  03/08/2014  REFERRING MD :  Pearlean Brownie  CHIEF COMPLAINT:  Respiratory failure in setting of ICH  INITIAL PRESENTATION: 43 y.o. F brought to [facility] ED on 10/6 with left sided weakness, slurred speech, inability to stand up.  In ED, found to have acute lateral right basal ganglia ICH with mild mass effect and 62mm MLS.  Admitted to neuro ICU and developed acute respiratory failure, failed bipap and PCCM called for intubation and management. She was admitted to neuro ICU and later that evening had increasing O2 demands.  She had no improvement in SpO2, work of breathing, and mental status despite 3 hours of BiPAP.  PCCM was consulted for intubation.  STUDIES:  10/06 CT head:  acute hemorrhage into the lateral right basal ganglia, likely indicating a hypertensive hemorrhage/hemorrhagic stroke.  Mild mass effect with 32mm MLS to the left. 10/07 Renal US:  echogenic kidneys s/w ckd, no hydro.  10/07 CT head: [facility] 10/07 repeat CT head: [facility] 10/07 Doppler carotid: consistent with 1-39% stenosis involving the R internal carotid art and the L internal carotid art 10/07 TTE: LVEF 60-65%. Severe concentric LVH. LA dilatation. PASP est 43 mmHg 10/09 CT head: [facility] 10/09 LE venous Dopplers (ordered by Stroke) >> negative 10/20 MRI / MRA brain >> stable subacute intraparenchymal hemorrhage, localized vasogenic edema, mild mass effect, 4 mm R to L shift, acute L MCA infarct, largely watershed in distribution, additional small infarcts in deep white matter   SIGNIFICANT EVENTS: 10/06  Admitted for ICH, developed respiratory failure requiring intubation. 10/07  Persistent hypertension. Remained on nicardipine gtt. Persistent volume overload snd pulm edema pattern on CXR. Lasix gtt resulted in minimal increase in Uo 10/08  Worsening renal function, oliguria. Renal consult. HD  performed 10/09  Acute change in neuro status. STAT CT head ordered: [facility] 10/09  Further HD planned 10/14  HD with negative fluid balance 10/15  Trach (JY) 10/16  Febrile and purulent matter from nasal cavity and trach. 10/17  Transition from fentanyl gtt to Duragesic patch. Begin PSV weaning 10/18  Transfer to vent SDU bed ordered. Tolerating PSV 10 cm H2O 10/19  Transfer to SDU/2900 10/21  Persistent diarrhea, d/c'd senokot  SUBJ - RN reports diarrhea, no other acute issues  VITAL SIGNS: Temp:  [97.8 F (36.6 C)-98.7 F (37.1 C)] 98.7 F (37.1 C) (10/21 1147) Pulse Rate:  [65-114] 90 (10/21 1147) Resp:  [15-31] 25 (10/21 1147) BP: (119-186)/(59-99) 131/73 mmHg (10/21 1147) SpO2:  [90 %-100 %] 98 % (10/21 1147) FiO2 (%):  [35 %-100 %] 35 % (10/21 0827) Weight:  [225 lb 15.5 oz (102.5 kg)] 225 lb 15.5 oz (102.5 kg) (10/21 0456)  VENTILATOR SETTINGS: Vent Mode:  [-] PRVC FiO2 (%):  [35 %-100 %] 35 % Set Rate:  [15 bmp] 15 bmp Vt Set:  [550 mL] 550 mL PEEP:  [5 cmH20] 5 cmH20  INTAKE / OUTPUT: Intake/Output     10/20 0701 - 10/21 0700 10/21 0701 - 10/22 0700   I.V. (mL/kg) 100 (1)    Other 120    NG/GT 1120 120   IV Piggyback     Total Intake(mL/kg) 1340 (13.1) 120 (1.2)   Urine (mL/kg/hr)     Other     Total Output       Net +1340 +120        Urine Occurrence 1 x  PHYSICAL EXAMINATION:  Gen: minimally responsive to ext stimuli HEENT: Trach site c/d/i PULM: rhonchi bilaterally, L>R CV: distant tones RRR, no mgr AB: BS+, soft, nontender, PEG Ext: trace edema Neuro: minimal responsive to external stimuli, withdraws to pain RUE  LABS:  I have reviewed all of today's lab results. Relevant abnormalities are discussed in the A/P section  CXR: CM, improved edema  ASSESSMENT / PLAN:  PULMONARY ETT 10/6 >> 10/15 Trach tube 10/15 >>  A: Acute hypoxemic respiratory failure Acute pulmonary edema  - cardiogenic, resolved P:   Wean in PSV as tolerated - goal  ATC, daytime.  Probably will need nocturnal vent support for long term. Cont vent bundle Mobilize as able  PRN CXR   CARDIOVASCULAR CVL R IJ 10/6 >>  A:  HTN crisis, resolved Severe pulmonary edema, improved Acute systolic v diastolic chf > resolved NSTEMI Elevated troponin (pk 4.24 10/07) > type 2/demand, resolved P:  Continue current rx PRN hydralazine to maintain SBP < 170 mmHg PRN metoprolol to maintain HR < 115/min  RENAL L IJ HD cath 10/08 >> 10/20 Perm Cath L [place] 10/20 >> A:   Acute renal failure, oligoanuric Suspected CKD - likely hypertensive volume overload, improving with HD P:   Monitor BMET intermittently Monitor I/Os Correct electrolytes as indicated HD per renal > volume removal as tolerated Perm cath placement, appreciate IR assistance  GASTROINTESTINAL A:   Obesity P:   SUP: Pantoprazole Cont TFs  HEMATOLOGIC A:   ICU acquired anemia P:  DVT px: SQ heparin Monitor CBC intermittently Transfuse per usual ICU guidelines  INFECTIOUS A:   HCAP (but not vent associated event based on vent data) > MSSA P:   Resp 10/17 >> Abundant staph aureus Blood 10/16 >> ng C.diff 10/20 >> neg  Fluconazole 10/15 >> 10/17 Vancomycin 10/16 >> plan 7 days Zosyn 10/16 >> 10/20 Ancef 10/21 >> MWF with HD   ENDOCRINE A:   Hyperglycemia, controlled P:   CBG's q4hr Cont SSI  NEUROLOGIC A:   Hypertensive R basal Ganglion Hemorrhage  Acute Encephalopathy Concern for second ischemia subcortical event P:   Mgmt per Stroke team Cont Fent patch 100 mcg, consider reduction  Cont PRN fentanyl  RASS goal: 0   FAMILY: None available 10/21  TODAY'S SUMMARY: Hope to wean to ATC eventually but neuro prognosis appears poor. Repeat MRI with new L MCA watershed CVA.     Canary BrimBrandi Ollis, NP-C [facility] Pgr: [phone] or [phone]  Attending:  I have seen and examined the patient with nurse practitioner/resident and agree with the note  above.   Exam> lungs clear, mental status unchanged, on trach collar  Now tolerating ATC 24 hours, permcath in place  Will discuss placement options with Case Management  Heber CarolinaBrent McQuaid, MD [place] [facility] Pager: [phone] Cell: [phone] If no response, call [phone]

## 2014-03-08 NOTE — Progress Notes (Signed)
Pt back from MRI, RT placed Pt back on 40% ATC, Pt tolerating well at this time, RT to monitor and assess as needed.

## 2014-03-08 NOTE — Progress Notes (Signed)
ANTIBIOTIC CONSULT NOTE - FOLLOW UP  Pharmacy Consult for Cefazolin Indication: MSSA PNA  No Known Allergies  Patient Measurements: Height: 5\' 4"  (162.6 cm) Weight: 225 lb 15.5 oz (102.5 kg) IBW/kg (Calculated) : 54.7 Adjusted Body Weight:   Vital Signs: Temp: 98.2 F (36.8 C) (10/21 0700) Temp Source: Oral (10/21 0700) BP: 186/99 mmHg (10/21 1000) Pulse Rate: 114 (10/21 1000) Intake/Output from previous day: 10/20 0701 - 10/21 0700 In: 1340 [I.V.:100; NG/GT:1120] Out: -  Intake/Output from this shift: Total I/O In: 120 [NG/GT:120] Out: -   Labs:  Recent Labs  03/06/14 0430 03/07/14 0530 03/07/14 0845 03/08/14 0445  WBC 11.1*  --  9.0 10.1  HGB 7.4*  --  7.8* 7.6*  PLT 267  --  263 284  CREATININE 9.14* 5.98*  --  7.92*   Estimated Creatinine Clearance: 10.7 ml/min (by C-G formula based on Cr of 7.92). No results found for this basename: VANCOTROUGH, Leodis BinetVANCOPEAK, VANCORANDOM, GENTTROUGH, GENTPEAK, GENTRANDOM, TOBRATROUGH, TOBRAPEAK, TOBRARND, AMIKACINPEAK, AMIKACINTROU, AMIKACIN,  in the last 72 hours   Microbiology: Recent Results (from the past 720 hour(s))  MRSA PCR SCREENING     Status: Abnormal   Collection Time    02/21/14  9:37 PM      Result Value Ref Range Status   MRSA by PCR POSITIVE (*) NEGATIVE Final   Comment:            The GeneXpert MRSA Assay (FDA     approved for NASAL specimens     only), is one component of a     comprehensive MRSA colonization     surveillance program. It is not     intended to diagnose MRSA     infection nor to guide or     monitor treatment for     MRSA infections.     RESULT CALLED TO, READ BACK BY AND VERIFIED WITH:     M.BOULDEN,RN 0154 02/22/14 M.CAMPBELL  CULTURE, BLOOD (ROUTINE X 2)     Status: None   Collection Time    03/03/14 10:02 AM      Result Value Ref Range Status   Specimen Description BLOOD RIGHT HAND   Final   Special Requests BOTTLES DRAWN AEROBIC ONLY 3CC   Final   Culture  Setup Time      Final   Value: 03/03/2014 17:08     Performed at [facility]   Culture     Final   Value:        BLOOD CULTURE RECEIVED NO GROWTH TO DATE CULTURE WILL BE HELD FOR 5 DAYS BEFORE ISSUING A FINAL NEGATIVE REPORT     Performed at [facility]   Report Status PENDING   Incomplete  CULTURE, BLOOD (ROUTINE X 2)     Status: None   Collection Time    03/03/14  2:30 PM      Result Value Ref Range Status   Specimen Description BLOOD HEMODIALYSIS CATHETER   Final   Special Requests BOTTLES DRAWN AEROBIC AND ANAEROBIC 10CC   Final   Culture  Setup Time     Final   Value: 03/03/2014 20:41     Performed at [facility]   Culture     Final   Value:        BLOOD CULTURE RECEIVED NO GROWTH TO DATE CULTURE WILL BE HELD FOR 5 DAYS BEFORE ISSUING A FINAL NEGATIVE REPORT     Performed at [facility]   Report Status  PENDING   Incomplete  CULTURE, RESPIRATORY (NON-EXPECTORATED)     Status: None   Collection Time    03/04/14  9:30 AM      Result Value Ref Range Status   Specimen Description TRACHEAL ASPIRATE   Final   Special Requests NONE   Final   Gram Stain     Final   Value: ABUNDANT WBC PRESENT,BOTH PMN AND MONONUCLEAR     FEW SQUAMOUS EPITHELIAL CELLS PRESENT     MODERATE GRAM POSITIVE COCCI IN PAIRS     FEW GRAM POSITIVE RODS     Performed at [facility]   Culture     Final   Value: ABUNDANT STAPHYLOCOCCUS AUREUS     Note: RIFAMPIN AND GENTAMICIN SHOULD NOT BE USED AS SINGLE DRUGS FOR TREATMENT OF STAPH INFECTIONS.     Performed at [facility]   Report Status 03/08/2014 FINAL   Final   Organism ID, Bacteria STAPHYLOCOCCUS AUREUS   Final  CLOSTRIDIUM DIFFICILE BY PCR     Status: None   Collection Time    03/06/14  3:00 AM      Result Value Ref Range Status   C difficile by pcr NEGATIVE  NEGATIVE Final    Anti-infectives   Start     Dose/Rate Route Frequency Ordered Stop   03/07/14 1130  vancomycin (VANCOCIN) 1,500 mg in sodium chloride  0.9 % 500 mL IVPB     1,500 mg 250 mL/hr over 120 Minutes Intravenous  Once 03/07/14 1121 03/07/14 1422   03/06/14 1200  vancomycin (VANCOCIN) IVPB 1000 mg/200 mL premix  Status:  Discontinued     1,000 mg 200 mL/hr over 60 Minutes Intravenous Every M-W-F (Hemodialysis) 03/05/14 1338 03/06/14 0902   03/06/14 0600  ceFAZolin (ANCEF) IVPB 2 g/50 mL premix    Comments:  hang ON CALL to xray 10/19   2 g 100 mL/hr over 30 Minutes Intravenous On call 03/04/14 1117 03/06/14 0726   03/04/14 1600  vancomycin (VANCOCIN) IVPB 1000 mg/200 mL premix     1,000 mg 200 mL/hr over 60 Minutes Intravenous  Once 03/04/14 1502 03/04/14 1654   03/03/14 1000  piperacillin-tazobactam (ZOSYN) IVPB 2.25 g  Status:  Discontinued     2.25 g 100 mL/hr over 30 Minutes Intravenous 3 times per day 03/03/14 0917 03/07/14 1121   03/03/14 1000  vancomycin (VANCOCIN) 1,500 mg in sodium chloride 0.9 % 500 mL IVPB     1,500 mg 250 mL/hr over 120 Minutes Intravenous  Once 03/03/14 0918 03/03/14 1213   03/02/14 1300  fluconazole (DIFLUCAN) IVPB 200 mg  Status:  Discontinued     200 mg 100 mL/hr over 60 Minutes Intravenous Every 24 hours 03/02/14 1205 03/04/14 0932      Assessment: 43yof with MSSA PNA to have antibiotics changed from Vancomycin (10/16-10/21, also on Zosyn 10/16-10/20) to Cefazolin. Patient is currently afebrile, WBC wnl and blood cultures remain NGTD. Patient has AKI/CKD but is requiring HD - per Renal notes, continuing HD MWF for now. Will plan to schedule Cefazolin 2g with each HD and monitor HD schedule and antibiotic length of therapy.  Plan:  1. Cefazolin 2g IV qHD (MWF) 2. Monitor HD schedule, Renal plans, cultures and clinical course 3. Follow-up antibiotic length of therapy  Cleon Dew [id number] 03/08/2014,11:20 AM

## 2014-03-08 NOTE — Progress Notes (Signed)
Placed patient back on vent support for the night due to increased respiratory rate of 30-37.Will continue with wean in the morning.

## 2014-03-08 NOTE — Progress Notes (Signed)
2340 - pt transported to MRI on monitor and on vent support. Scan completed at 0100. No events during transport/scan.

## 2014-03-09 ENCOUNTER — Inpatient Hospital Stay (HOSPITAL_COMMUNITY): Payer: 59

## 2014-03-09 DIAGNOSIS — J189 Pneumonia, unspecified organism: Secondary | ICD-10-CM

## 2014-03-09 LAB — CULTURE, BLOOD (ROUTINE X 2)
CULTURE: NO GROWTH
Culture: NO GROWTH

## 2014-03-09 LAB — RENAL FUNCTION PANEL
ANION GAP: 18 — AB (ref 5–15)
Albumin: 2.3 g/dL — ABNORMAL LOW (ref 3.5–5.2)
BUN: 45 mg/dL — ABNORMAL HIGH (ref 6–23)
CHLORIDE: 94 meq/L — AB (ref 96–112)
CO2: 27 mEq/L (ref 19–32)
Calcium: 9.1 mg/dL (ref 8.4–10.5)
Creatinine, Ser: 4.82 mg/dL — ABNORMAL HIGH (ref 0.50–1.10)
GFR calc Af Amer: 12 mL/min — ABNORMAL LOW (ref >90)
GFR calc non Af Amer: 10 mL/min — ABNORMAL LOW (ref >90)
GLUCOSE: 129 mg/dL — AB (ref 70–99)
POTASSIUM: 3.9 meq/L (ref 3.7–5.3)
Phosphorus: 5.9 mg/dL — ABNORMAL HIGH (ref 2.3–4.6)
SODIUM: 139 meq/L (ref 137–147)

## 2014-03-09 LAB — GLUCOSE, CAPILLARY
GLUCOSE-CAPILLARY: 118 mg/dL — AB (ref 70–99)
Glucose-Capillary: 127 mg/dL — ABNORMAL HIGH (ref 70–99)
Glucose-Capillary: 120 mg/dL — ABNORMAL HIGH (ref 70–99)
Glucose-Capillary: 137 mg/dL — ABNORMAL HIGH (ref 70–99)
Glucose-Capillary: 115 mg/dL — ABNORMAL HIGH (ref 70–99)
Glucose-Capillary: 114 mg/dL — ABNORMAL HIGH (ref 70–99)

## 2014-03-09 MED ORDER — ENALAPRIL 1 MG/ML SUSP
5.0000 mg | Freq: Every day | ORAL | Status: DC
  Administered 2014-03-09 – 2014-03-13 (×4): 5 mg
  Filled 2014-03-09 (×7): qty 5

## 2014-03-09 MED ORDER — SCOPOLAMINE 1 MG/3DAYS TD PT72
1.0000 | MEDICATED_PATCH | TRANSDERMAL | Status: DC
  Administered 2014-03-09 – 2014-03-18 (×4): 1.5 mg via TRANSDERMAL
  Filled 2014-03-09 (×5): qty 1

## 2014-03-09 MED ORDER — HYDRALAZINE HCL 50 MG PO TABS
100.0000 mg | ORAL_TABLET | Freq: Four times a day (QID) | ORAL | Status: DC
  Administered 2014-03-09 – 2014-03-16 (×21): 100 mg
  Filled 2014-03-09 (×32): qty 2

## 2014-03-09 NOTE — Progress Notes (Signed)
Patient ID: Lynn NationsStephanie Gentry, female    DOB: 1971/02/25, 43 y.o.   MRN: [id number]  S: Opens eyes minimally.  O:BP 171/79  Pulse 98  Temp(Src) 99.1 F (37.3 C) (Oral)  Resp 24  Ht 5\' 4"  (1.626 m)  Wt 241 lb 10 oz (109.6 kg)  BMI 41.45 kg/m2  SpO2 98%  LMP 03/01/2014  Intake/Output Summary (Last 24 hours) at 03/09/14 0750 Last data filed at 03/09/14 0600  Gross per 24 hour  Intake   1410 ml  Output   3000 ml  Net  -1590 ml   Intake/Output: I/O last 3 completed shifts: In: 1990 [I.V.:100; Other:650; NG/GT:1240] Out: 3000 [Other:3000]  Intake/Output this shift:    Weight change: 27 lb 5.4 oz (12.4 kg) Gen:NAD, trach in place HEENT: pupils reactive but sluggish CVS: RRR, no mrg Resp: transmitted upper airway Abd: obese, soft, +BS Ext: 2+ pedal edema.   Recent Labs Lab 03/03/14 0500 03/04/14 0319 03/06/14 0430 03/07/14 0530 03/08/14 0445 03/09/14 0450  NA 138 138 139 140 140 139  K 4.5 3.9 4.8 3.8 4.4 3.9  CL 96 97 93* 96 97 94*  CO2 23 23 21 25 25 27   GLUCOSE 117* 136* 113* 124* 118* 129*  BUN 102* 92* 152* 80* 99* 45*  CREATININE 6.56* 5.99* 9.14* 5.98* 7.92* 4.82*  ALBUMIN  --   --  2.0* 2.1* 2.0* 2.3*  CALCIUM 9.1 8.5 8.6 8.5 8.7 9.1  PHOS 7.0* 6.1* 10.8* 6.0* 8.9* 5.9*   Liver Function Tests:  Recent Labs Lab 03/07/14 0530 03/08/14 0445 03/09/14 0450  ALBUMIN 2.1* 2.0* 2.3*   No results found for this basename: LIPASE, AMYLASE,  in the last 168 hours No results found for this basename: AMMONIA,  in the last 168 hours CBC:  Recent Labs Lab 03/03/14 0500 03/04/14 0319 03/06/14 0430 03/07/14 0845 03/08/14 0445  WBC 15.1* 14.8* 11.1* 9.0 10.1  NEUTROABS  --   --  8.6*  --  7.2  HGB 8.3* 7.6* 7.4* 7.8* 7.6*  HCT 26.7* 24.1* 23.8* 24.7* 24.4*  MCV 90.5 88.3 90.8 90.5 91.0  PLT 281 242 267 263 284   Cardiac Enzymes: No results found for this basename: CKTOTAL, CKMB, CKMBINDEX, TROPONINI,  in the last 168 hours CBG:  Recent Labs Lab  03/08/14 1149 03/08/14 1746 03/08/14 1948 03/08/14 2310 03/09/14 0336  GLUCAP 150* 110* 122* 127* 120*    Iron Studies: No results found for this basename: IRON, TIBC, TRANSFERRIN, FERRITIN,  in the last 72 hours Studies/Results:  . amLODipine  10 mg Per Tube Daily  . antiseptic oral rinse  7 mL Mouth Rinse QID  . [START ON 03/10/2014]  ceFAZolin (ANCEF) IV  2 g Intravenous Q M,W,F-1800  . chlorhexidine  15 mL Mouth Rinse BID  . cloNIDine  0.3 mg Transdermal Q Mon  . darbepoetin (ARANESP) injection - DIALYSIS  100 mcg Intravenous Q Mon-HD  . feeding supplement (NEPRO CARB STEADY)  1,000 mL Per Tube Q24H  . fentaNYL  100 mcg Transdermal Q72H  . heparin subcutaneous  5,000 Units Subcutaneous 3 times per day  . hydrALAZINE  50 mg Per Tube 4 times per day  . insulin aspart  0-15 Units Subcutaneous 6 times per day  . metoprolol tartrate  50 mg Per Tube BID  . multivitamin  5 mL Per Tube Daily  . pantoprazole sodium  40 mg Per Tube Q1200    BMET    Component Value Date/Time   NA 139 03/09/2014 0450  K 3.9 03/09/2014 0450   CL 94* 03/09/2014 0450   CO2 27 03/09/2014 0450   GLUCOSE 129* 03/09/2014 0450   BUN 45* 03/09/2014 0450   CREATININE 4.82* 03/09/2014 0450   CALCIUM 9.1 03/09/2014 0450   GFRNONAA 10* 03/09/2014 0450   GFRAA 12* 03/09/2014 0450   CBC    Component Value Date/Time   WBC 10.1 03/08/2014 0445   RBC 2.68* 03/08/2014 0445   HGB 7.6* 03/08/2014 0445   HCT 24.4* 03/08/2014 0445   PLT 284 03/08/2014 0445   MCV 91.0 03/08/2014 0445   MCH 28.4 03/08/2014 0445   MCHC 31.1 03/08/2014 0445   RDW 17.8* 03/08/2014 0445   LYMPHSABS 1.3 03/08/2014 0445   MONOABS 1.3* 03/08/2014 0445   EOSABS 0.2 03/08/2014 0445   BASOSABS 0.1 03/08/2014 0445   Assessment/Plan: 43 y.o. female with no significant known PMH admitted for right basal ganglia hemorrhage, underwent intubation for declining respiratory status and oliguric, started on HD.  1. AKI vs. Subacute vs.  AKI/CKD- oliguric requiring dialysis. S/p HD treatments with marked improvement of volume and BP  1. Cont with HD MWF. 2. Remains oliguric and dialysis-dependent  2. Vascular access: TDC placed left IJ 10/20. 3. 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 Elevated phos. 5. R BG hemorrhagic stroke- MRI 10/21 with stable hematoma lentiform nucleas, acute left MCA infarct, small infarcts right frontal, parieto-occiptal, left pons 6. VDRF- per PCCM  7. Malignant HTN and volume overload- improving with aggressive HD and UF.    [name]  Renal Attending: Agree with note as articulated above.  SHe remais quite ill, dialysis dependent and VDRF.  We plan for next HD on Friday. Nikholas Geffre C

## 2014-03-09 NOTE — Progress Notes (Signed)
[facility]  I had a lengthy conversation with family (husband, mother, sister, aunt) today to let them know about the MRI brain findings.  I explained that this portends a poor neurologic prognosis.  They had several questions about her respiratory failure and kidney failure which I answered.    I briefly discussed the fact that this means her likelihood of a meaningful recovery is low, but did not address end of life issues yet.  They would like to discuss the MRI findings with neurology tomorrow.    I have also consulted palliative medicine for further discussion of goals of care.  Heber , MD [place] [facility] Pager: [phone] Cell: [phone] If no response, call [phone]

## 2014-03-09 NOTE — Progress Notes (Signed)
**Note De-Identified Lynn Gentry Obfuscation** Tracheal sutures removed site WNL

## 2014-03-09 NOTE — Plan of Care (Signed)
Problem: RH BOWEL ELIMINATION Goal: RH STG MANAGE BOWEL W/EQUIPMENT W/ASSISTANCE STG Manage Bowel With Equipment With Assistance  Outcome: Progressing Pt has rectal pouch to help reduce skin break down and keeping pt dry. Pt is currently incontinent with watery diarrhea. Pt has been ruled out for C.Diff.

## 2014-03-09 NOTE — Progress Notes (Signed)
Pt both Fent and Clonidine patch came off while completing pt care. Nurse notified pharmacy to reorder patches.

## 2014-03-09 NOTE — Progress Notes (Signed)
STROKE TEAM PROGRESS NOTE   HISTORY Lynn Gentry is an 43 y.o. female with no previously documented medical disorder who was brought to [facility] with new onset slurred speech and left-sided weakness. Patient went to work as usual until 1500. She was found by her son at home at 1600 unable to get up out of a chair with left sided weakness, slurred speech. Patient was last known well around 3 PM this afternoon 02/21/2014. CT scan of her head showed an acute 4.1 x 1.5 cm right basal ganglia hemorrhage with mild mass effect and right to left shift of 5 mm. Patient's blood pressure was markedly elevated at 226/111. She was started on Cardene drip and blood pressure responded well. She has shown continual respiratory difficulty. She was initially given oxygen by nasal cannula followed by NBR, and subsequently placed on BiPAP. She continued to have oxygen saturations only in the high 80s to low 90s on 100% O2, with a respiratory rates in the 40s. ABG showed pH of 2.82, PCO2 37 PO2 of 57. Chest x-ray showed mild cardiomegaly with possible asymmetric pulmonary edema. BNP was 36,455. Because of deteriorating respiratory status intubation and mechanical ventilation was elected. CCM service was consulted. Patient was not administered TPA secondary to ICH. She was admitted to the neuro ICU  for further evaluation and treatment.   SUBJECTIVE (INTERVAL HISTORY) Pt remains responsive today, open eyes on voice although not tracking and not following any commands. Withdraw to LLE more than other limbs on pain stimulation.  D/w  with Dr Kendrick Fries poor prognosis given MRI findings of moderate size left MCA infarct and small Pontine and subcortical infarcta as well in additon to prior right basal ganglia hemorrhage. MRI multifocal narrowing is responsible to vasculitis patient is not a candidate for biopsy for definitive diagnosis given her poor baseline and general medical condition.Peripheral blood markers for  systemic vasculitis are negative OBJECTIVE Temp:  [97.2 F (36.2 C)-99.3 F (37.4 C)] 99.3 F (37.4 C) (10/22 1114) Pulse Rate:  [69-106] 90 (10/22 1114) Cardiac Rhythm:  [-] Normal sinus rhythm (10/22 0800) Resp:  [15-28] 23 (10/22 1205) BP: (113-178)/(57-92) 178/92 mmHg (10/22 1114) SpO2:  [96 %-100 %] 99 % (10/22 1114) FiO2 (%):  [28 %-40 %] 28 % (10/22 1300) Weight:  [241 lb 10 oz (109.6 kg)-247 lb 2.2 oz (112.1 kg)] 241 lb 10 oz (109.6 kg) (10/22 0300)   Recent Labs Lab 03/08/14 1948 03/08/14 2310 03/09/14 0336 03/09/14 0740 03/09/14 1110  GLUCAP 122* 127* 120* 118* 137*    Recent Labs Lab 03/03/14 0500 03/04/14 0319 03/06/14 0430 03/07/14 0530 03/08/14 0445 03/09/14 0450  NA 138 138 139 140 140 139  K 4.5 3.9 4.8 3.8 4.4 3.9  CL 96 97 93* 96 97 94*  CO2 23 23 21 25 25 27   GLUCOSE 117* 136* 113* 124* 118* 129*  BUN 102* 92* 152* 80* 99* 45*  CREATININE 6.56* 5.99* 9.14* 5.98* 7.92* 4.82*  CALCIUM 9.1 8.5 8.6 8.5 8.7 9.1  MG 2.5 2.4  --   --   --   --   PHOS 7.0* 6.1* 10.8* 6.0* 8.9* 5.9*    Recent Labs Lab 03/06/14 0430 03/07/14 0530 03/08/14 0445 03/09/14 0450  ALBUMIN 2.0* 2.1* 2.0* 2.3*    Recent Labs Lab 03/03/14 0500 03/04/14 0319 03/06/14 0430 03/07/14 0845 03/08/14 0445  WBC 15.1* 14.8* 11.1* 9.0 10.1  NEUTROABS  --   --  8.6*  --  7.2  HGB 8.3* 7.6* 7.4*  7.8* 7.6*  HCT 26.7* 24.1* 23.8* 24.7* 24.4*  MCV 90.5 88.3 90.8 90.5 91.0  PLT 281 242 267 263 284   No results found for this basename: CKTOTAL, CKMB, CKMBINDEX, TROPONINI,  in the last 168 hours No results found for this basename: LABPROT, INR,  in the last 72 hours No results found for this basename: COLORURINE, APPERANCEUR, LABSPEC, PHURINE, GLUCOSEU, HGBUR, BILIRUBINUR, KETONESUR, PROTEINUR, UROBILINOGEN, NITRITE, LEUKOCYTESUR,  in the last 72 hours     Component Value Date/Time   CHOL 177 02/28/2014 0350   Lab Results  Component Value Date   HGBA1C 5.8* 02/23/2014   No  results found for this basename: labopia,  cocainscrnur,  labbenz,  amphetmu,  thcu,  labbarb    No results found for this basename: ETH,  in the last 168 hours  Ct Head Wo Contrast 02/28/14 - Stable 4.2 x 1.1 cm right lenticular nucleus hematoma with mild  surrounding vasogenic edema. Local mass effect upon the right  lateral ventricle with slight bowing of the septum to the left by  3.9 mm without significant change.  This may represent result of hypertensive hemorrhage and can be  assessed as this clears.  Prominent white matter type changes asymmetric greater on the left  more conspicuous on the present exam than on the prior exam. The  patient may benefit from followup MR and MR angiogram for further  delineation. This may be ischemic in origin possibly related to  watershed type infarct. Other causes not excluded although felt to  be less likely.  02/24/2014 Stable appearance of the hematoma within the right basal ganglia. No new or progressive findings.  02/22/2014   1. No significant interval change in the patient's intraparenchymal hemorrhage at the right lateral basal ganglia, measuring 4.1 x 1.2 cm, with mild surrounding vasogenic edema. Mild associated mass effect, with 4 mm of leftward midline shift again seen. 2. Scattered small vessel ischemic microangiopathy. 3. Bilateral proptosis noted.     02/21/2014   1. Acute hemorrhage into the lateral right basal ganglia, likely indicating a hypertensive hemorrhage/hemorrhagic stroke. 2. Mild mass effect with shift of the midline to the left approximating 5 mm. Critical   US Renal Port 02/22/2014    1. Small echogenic kidneys consistent with chronic renal medical disease. No hydronephrosis. 2. The urinary bladder is decompressed by Foley catheter and cannot be evaluated.     Dg Chest Port 1 View 02/22/2014    1. Satisfactory positioning of endotracheal tube and right IJ line. 2. Cardiomegaly with persistent diffuse interstitial and  airspace disease likely reflecting edema and congestive heart failure. 3. Bilateral pleural effusions and basilar airspace disease. This likely reflects atelectasis.    02/21/2014    Mild cardiac enlargement and possible asymmetric pulmonary edema. However, exam is limited by body habitus.     2D echo - - Left ventricle: The cavity size was normal. There was severe concentric hypertrophy. Systolic function was normal. The estimated ejection fraction was in the range of 60% to 65%. Wall motion was normal; there were no regional wall motion abnormalities. - Left atrium: The atrium was mildly dilated. - Pulmonary arteries: PA peak pressure: 43 mm Hg (S). - Pericardium, extracardiac: A small, free-flowing pericardial effusion was identified posterior to the heart and circumferential to the heart. The fluid had no internal echoes.There was no evidence of hemodynamic compromise. Impressions: - The right ventricular systolic pressure was increased consistent with moderate pulmonary hypertension.  CUS - - Findings consistent with 1-39  percent stenosis involving the right internal carotid artery and the left internal carotid artery. - Right vertebral artery not imaged due to central line dressing. Left vertebral artery not imaged due to patient position.  DVT - - No obvious evidence of deep vein or superficial thrombosis involving the right lower extremity and left lower extremity. - No evidence of Baker&'s cyst on the right or left.  EEG 02/28/14 - This EEG is abnormal with severe generalized slowing of cerebral activity consistent with severe encephalopathic state. These findings are nonspecific and can be seen with metabolic as well as toxic and degenerative encephalopathies. No evidence of an epileptic disorder was demonstrated. MRI Brain 03/08/2014 :1. Stable size and morphology of 4.5 x 1.4 x 2.8 cm subacute  intraparenchymal hematoma involving the right lentiform nucleus with  associated  vasogenic edema and mass effect on the right lateral  ventricle. There is persistent 4 mm of right-to-left midline shift.  2. Acute left MCA territory infarct involving the deep white matter  of the left frontoparietal lobes. This is largely watershed in  distribution.  3. Additional small infarcts within the deep white matter of the  right frontal lobe, right parieto-occipital region, and left pons.  MRA HEAD :  1. Multi focal irregularity with associated stenoses within the  supra clinoid left ICA, left M1 segment, and possibly left A1  segment as above. Possible vasculitis or drug exposure could be  considered  A1C 5.8 and LDL 92  PHYSICAL EXAM Young obese Caucasian lady not in distress. On trach collar  . Afebrile. Head is nontraumatic. Neck is supple without bruit. Cardiac exam soft ejection murmur RRR .no gallop. Lungs bilateral crackles and  rhonchi to auscultation. Distal pulses are well felt.   Neurological Exam: on trach collar now, eyes open on voice but not tracking and not following commands.Aphasic Eyes in the middle. Pupils 3 mm reactive. Fundi not visualized. Left lower face weakness, corneal and gag present. On pain stimulation, she had trace withdraw to pain on RUE and RLE and LUE and more on LLE. Left Plantar upgoing and right down going.  ASSESSMENT/PLAN Ms. Kathlee NationsStephanie Troy is a 43 y.o. female with no documented medical history found with new onset slurred speech and left-sided weakness. CT showed right external capsule hemorrhage. However, her condition getting worse with pulmonary edema, AKI requiring HD, respiratory failure needing ventilation. Transferred pt to CCM for better management.  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, nondominant side with cytotoxic edema, mild transfalcine herniation, pulmonary edema, respiratory and renal failure  MRI showed  Moderate size left MCA and small subcortical and Pontine infarcts with MRA  showing multifocal narrowing raising possibility of CNS vasculitis. Peripheral markers for systemic vasculitis and negative. Due to her general neurological and medical condition she is not a candidate at the present time for brain biopsy for definitive diagnosis of CNS vasculitis.Plan to check urine drug screen  EEG showed severe slowing but no seizure.  2D echo unremarkable  Carotid doppler unremarkable  HgbA1c 5.8  Heparin subq for VTE prophylaxis  Resultant VDRF, left hemiparesis  no antithrombotics prior to admission, currently not on antithrombotics due to hemorrhage.  Ongoing aggressive risk factor management  Clinical improving  Acute Respiratory Failure with severe acute pulmonary edema  trach on 03/02/14  CCM following  Diuresing, IV Lasix, Hemodialysis, urine output minimal today  CXR series  Malignant Hypertension  BP 226/111 at onset  Home meds:   none  SBP goal < 150  due to presence of end organ damage  On amlodipine, clonidine patch and metoprolol and hydralazine  off cardene   Acute renal failure with volume overload, suspect baseline chronic kidney disease  Diuresis and management per critical care, on HD  Continue to trend Cre.  HD as per nephrology  Other Stroke Risk Factors ETOH use   Morbid Obesity, Body mass index is 41.45 kg/(m^2).   Hospital day # 16 03/09/2014 2:24 PM   D/w Dr Kendrick Fries.    Delia Heady, MD Stroke Neurology 03/09/2014 2:24 PM   To contact Stroke Continuity provider, please refer to [email or web address]. After hours, contact General Neurology

## 2014-03-09 NOTE — Progress Notes (Signed)
**Note De-identified Correll Denbow Obfuscation** Sputum collected and sent to lab 

## 2014-03-09 NOTE — Progress Notes (Signed)
PULMONARY / CRITICAL CARE MEDICINE   Name: Lynn NationsStephanie Gentry MRN: [id number] DOB: 17-Mar-1971    ADMISSION DATE:  02/21/2014 CONSULTATION DATE:  03/09/2014  REFERRING MD :  Pearlean BrownieSethi  CHIEF COMPLAINT:  Respiratory failure in setting of ICH  INITIAL PRESENTATION: 43 [facility]. F [facility] with left sided weakness, slurred speech, inability to stand up.  In ED, found to have acute lateral right basal ganglia ICH with mild mass effect and 5mm MLS.  Admitted to neuro ICU and developed acute respiratory failure, failed bipap and PCCM called for intubation and management. She was admitted to neuro ICU and later that evening had increasing O2 demands.  She had no improvement in SpO2, work of breathing, and mental status despite 3 hours of BiPAP.  PCCM was consulted for intubation.  STUDIES:  10/06 CT head:  acute hemorrhage into the lateral right basal ganglia, likely indicating a hypertensive hemorrhage/hemorrhagic stroke.  Mild mass effect with 5mm MLS to the left. 10/07 Renal US:  echogenic kidneys s/w ckd, no hydro.  [facility]07 Doppler carotid: consistent with 1-39% stenosis involving the R internal carotid art and the L internal carotid art 10/07 TTE: LVEF 60-65%. Severe concentric LVH. LA dilatation. PASP est 43 mmHg [facility] LE venous Dopplers (ordered by Stroke) >> negative 10/21 MRI / MRA brain >> stable subacute intraparenchymal hemorrhage, localized vasogenic edema, mild mass effect, 4 mm R to L shift, acute L MCA infarct, largely watershed in distribution, additional small infarcts in deep white matter   SIGNIFICANT EVENTS: 10/06  Admitted for ICH, developed respiratory failure requiring intubation. 10/07  Persistent hypertension. Remained on nicardipine gtt. Persistent volume overload snd pulm edema pattern on CXR. Lasix gtt resulted in minimal increase in Uo 10/08  Worsening renal function, oliguria. Renal consult. HD  performed 10/09  Acute change in neuro status. STAT CT [facility]  Further HD planned 10/14  HD with negative fluid balance 10/15  Trach (JY) 10/16  Febrile and purulent matter from nasal cavity and trach. 10/17  Transition from fentanyl gtt to Duragesic patch. Begin PSV weaning 10/18  Transfer to vent SDU bed ordered. Tolerating PSV 10 cm H2O 10/19  Transfer to SDU/2900 10/21  Off vent 36hours, but went back on in evening for secretions/tachypnea; Persistent diarrhea, d/c'd senokot,   SUBJ - 10/21  Off vent 36hours, but went back on in evening for secretions/tachypnea; Persistent diarrhea, d/c'd senokot,   VITAL SIGNS: Temp:  [97.2 F (36.2 C)-99.1 F (37.3 C)] 99.1 F (37.3 C) (10/22 0742) Pulse Rate:  [69-106] 106 (10/22 1018) Resp:  [15-28] 24 (10/22 1018) BP: (113-171)/(57-81) 167/75 mmHg (10/22 0840) SpO2:  [96 %-100 %] 99 % (10/22 1018) FiO2 (%):  [28 %-40 %] 28 % (10/22 1018) Weight:  [109.6 kg (241 lb 10 oz)-114.9 kg (253 lb 4.9 oz)] 109.6 kg (241 lb 10 oz) (10/22 0300)  VENTILATOR SETTINGS: Vent Mode:  [-] PRVC FiO2 (%):  [28 %-40 %] 28 % Set Rate:  [15 bmp] 15 bmp Vt Set:  [500 mL] 500 mL PEEP:  [5 cmH20] 5 cmH20 Plateau Pressure:  [24 cmH20-27 cmH20] 24 cmH20  INTAKE / OUTPUT: Intake/Output     10/21 0701 - 10/22 0700 10/22 0701 - 10/23 0700   I.V. (mL/kg)     Other 530    NG/GT 880    Total Intake(mL/kg) 1410 (12.9)    Other 3000  Total Output 3000     Net -1590           PHYSICAL EXAMINATION:  Gen: only opens eyes to voice HEENT: Trach site c/d/i but secretions noted PULM: rhonchi bilaterally CV: distant tones RRR, no mgr AB: BS+, soft, nontender, PEG Ext: notable pitting foot edema Neuro: opens eyes to voice, no movement of extremities on my exam  LABS:  I have reviewed all of today's lab results. Relevant abnormalities are discussed in the A/P section  CXR: CM, improved edema  ASSESSMENT / PLAN:  PULMONARY ETT 10/6 >>  10/15 Trach tube 10/15 >>  A: Acute hypoxemic respiratory failure > nearly completely off vent support HCAP> MSSA P:   Attempt 24 hours ATC today CXR now to assess for HCAP Cont vent bundle  CARDIOVASCULAR CVL R IJ 10/6 >>  A:  HTN persistent Diastolic CHF > currently mild volume overload P:  Double hydralazine dose Continue clonidine, metoprolol, norvasc Add enalapril PRN hydralazine to maintain SBP < 170 mmHg PRN metoprolol to maintain HR < 115/min  RENAL L IJ HD cath 10/08 >> 10/20 Perm Cath L  10/20 >> A:   Acute renal failure> now ESRD  P:   Monitor BMET intermittently Monitor I/Os Correct electrolytes as indicated HD per renal > volume removal as tolerated Perm cath placement, appreciate IR assistance  GASTROINTESTINAL A:   Obesity P:   SUP: Pantoprazole Cont TFs  HEMATOLOGIC A:   ICU acquired anemia P:  DVT px: SQ heparin Monitor CBC intermittently Transfuse per usual ICU guidelines  INFECTIOUS A:   HCAP (but not vent associated event based on vent data) > MSSA P:   Resp 10/17 >> Abundant staph aureus MSSA Blood 10/16 >> ng C.diff 10/20 >> neg  Fluconazole 10/15 >> 10/17 Vancomycin 10/16 >> plan 7 days Zosyn 10/16 >> 10/20 Ancef 10/21 >> MWF with HD plan    ENDOCRINE A:   Hyperglycemia, controlled P:   CBG's q4hr Cont SSI  NEUROLOGIC A:   Hypertensive R basal Ganglion Hemorrhage  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 Repeat MRI with new L MCA watershed CVA.  I am concerned that she will not make any neurologic process. Have attempted to call family.   With multiple strokes and multi-organ failure her care appears medically non-beneficial.  Will consult palliative medicine.  Heber Sausal, MD [place] PCCM Pager: [phone] Cell: [phone] If no response, call [phone]

## 2014-03-10 LAB — RENAL FUNCTION PANEL
Albumin: 2.1 g/dL — ABNORMAL LOW (ref 3.5–5.2)
Anion gap: 15 (ref 5–15)
BUN: 64 mg/dL — ABNORMAL HIGH (ref 6–23)
CALCIUM: 8.9 mg/dL (ref 8.4–10.5)
CO2: 28 mEq/L (ref 19–32)
Chloride: 94 mEq/L — ABNORMAL LOW (ref 96–112)
Creatinine, Ser: 7.17 mg/dL — ABNORMAL HIGH (ref 0.50–1.10)
GFR calc Af Amer: 7 mL/min — ABNORMAL LOW (ref >90)
GFR calc non Af Amer: 6 mL/min — ABNORMAL LOW (ref >90)
Glucose, Bld: 115 mg/dL — ABNORMAL HIGH (ref 70–99)
PHOSPHORUS: 8 mg/dL — AB (ref 2.3–4.6)
POTASSIUM: 3.9 meq/L (ref 3.7–5.3)
SODIUM: 137 meq/L (ref 137–147)

## 2014-03-10 LAB — GLUCOSE, CAPILLARY
GLUCOSE-CAPILLARY: 118 mg/dL — AB (ref 70–99)
GLUCOSE-CAPILLARY: 131 mg/dL — AB (ref 70–99)
Glucose-Capillary: 125 mg/dL — ABNORMAL HIGH (ref 70–99)
Glucose-Capillary: 119 mg/dL — ABNORMAL HIGH (ref 70–99)
Glucose-Capillary: 134 mg/dL — ABNORMAL HIGH (ref 70–99)
Glucose-Capillary: 118 mg/dL — ABNORMAL HIGH (ref 70–99)

## 2014-03-10 MED ORDER — PENTAFLUOROPROP-TETRAFLUOROETH EX AERO: 1.0000 "application " | INHALATION_SPRAY | CUTANEOUS | Status: DC | PRN

## 2014-03-10 MED ORDER — HEPARIN SODIUM (PORCINE) 1000 UNIT/ML DIALYSIS: 20.0000 [IU]/kg | INTRAMUSCULAR | Status: DC | PRN

## 2014-03-10 MED ORDER — LIDOCAINE-PRILOCAINE 2.5-2.5 % EX CREA
1.0000 "application " | TOPICAL_CREAM | CUTANEOUS | Status: DC | PRN
  Filled 2014-03-10: qty 5

## 2014-03-10 MED ORDER — HEPARIN SODIUM (PORCINE) 1000 UNIT/ML DIALYSIS: 1000.0000 [IU] | INTRAMUSCULAR | Status: DC | PRN

## 2014-03-10 MED ORDER — ALTEPLASE 2 MG IJ SOLR: 2.0000 mg | Freq: Once | INTRAMUSCULAR | Status: DC | PRN

## 2014-03-10 MED ORDER — SODIUM CHLORIDE 0.9 % IV SOLN: 100.0000 mL | INTRAVENOUS | Status: DC | PRN

## 2014-03-10 MED ORDER — ALTEPLASE 2 MG IJ SOLR
2.0000 mg | Freq: Once | INTRAMUSCULAR | Status: AC | PRN
  Filled 2014-03-10: qty 2

## 2014-03-10 MED ORDER — LIDOCAINE HCL (PF) 1 % IJ SOLN: 5.0000 mL | INTRAMUSCULAR | Status: DC | PRN

## 2014-03-10 MED ORDER — NEPRO/CARBSTEADY PO LIQD
237.0000 mL | ORAL | Status: DC | PRN
  Filled 2014-03-10: qty 237

## 2014-03-10 MED ORDER — NEPRO/CARBSTEADY PO LIQD: 237.0000 mL | ORAL | Status: DC | PRN

## 2014-03-10 MED ORDER — SODIUM CHLORIDE 0.9 % IV SOLN
INTRAVENOUS | Status: DC | PRN
  Administered 2014-03-10 – 2014-03-11 (×2): via INTRAVENOUS

## 2014-03-10 MED ORDER — GUAIFENESIN 100 MG/5ML PO SOLN
10.0000 mL | Freq: Three times a day (TID) | ORAL | Status: DC
  Administered 2014-03-10 – 2014-03-20 (×29): 200 mg
  Filled 2014-03-10 (×33): qty 10

## 2014-03-10 MED ORDER — HEPARIN SODIUM (PORCINE) 1000 UNIT/ML DIALYSIS
20.0000 [IU]/kg | INTRAMUSCULAR | Status: DC | PRN
  Administered 2014-03-10: 2200 [IU] via INTRAVENOUS_CENTRAL

## 2014-03-10 NOTE — Progress Notes (Signed)
Called to room by Dr. Lowell Guitar. Patient WOB increased, and has new fresh blood clot in Trach collar device.  Used suction catheter and removed another clot from trach.  Oxygen saturation improved but WOB maintained increased.  Called RT to the room and notified Ivan Anchors.    Donn Zanetti S

## 2014-03-10 NOTE — Progress Notes (Signed)
CPT not done at this time because Bed CPT not available.

## 2014-03-10 NOTE — Progress Notes (Signed)
Her renal disease appears to be end stage given the small kidneys by ultrasound. I would not object to a contrast study for diagnostic purposes. Toneka Fullen C

## 2014-03-10 NOTE — Progress Notes (Signed)
Patient ID: Lynn Gentry, female    DOB: 08-Nov-1970, 43 y.o.   MRN: [id number] Nephrology Progress Note  Assessment/Plan: 43 y.o. female with no significant known PMH admitted for right basal ganglia hemorrhage, underwent intubation for declining respiratory status and oliguric, started on HD.   1. AKI vs. Subacute vs. AKI/CKD- oliguric requiring dialysis. S/p HD treatments with marked improvement of volume and BP  1. Cont with HD MWF. 2. Remains oliguric and dialysis-dependent  2. Vascular access: TDC placed left IJ 10/20. 3. 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 Elevated phos. 5. R BG hemorrhagic stroke- MRI 10/21 with stable hematoma lentiform nucleas, acute left MCA infarct, small infarcts right frontal, parieto-occiptal, left pons 6. VDRF- per PCCM  7. Malignant HTN and volume overload- improving with aggressive HD and UF.  8. Dispo: will continue to follow, palliative care and goals of care discussions underway  _________________________________________________________________ S: Eyes open, does not track.  O:BP 171/96  Pulse 68  Temp(Src) 98.4 F (36.9 C) (Oral)  Resp 19  Ht 5\' 4"  (1.626 m)  Wt 246 lb 14.6 oz (112 kg)  BMI 42.36 kg/m2  SpO2 100%  LMP 03/01/2014  Intake/Output Summary (Last 24 hours) at 03/10/14 0818 Last data filed at 03/10/14 0600  Gross per 24 hour  Intake   1140 ml  Output      0 ml  Net   1140 ml   Intake/Output: I/O last 3 completed shifts: In: 2190 [Other:830; NG/GT:1360] Out: -   Intake/Output this shift:    Weight change: -6 lb 6.3 oz (-2.9 kg) Gen: mild distress secondary to respiratory secretions CVS: RRR, no mrg Resp: transmitted upper airway sounds Abd: soft, obese, +BS Ext: trace edema   Recent Labs Lab 03/04/14 0319 03/06/14 0430 03/07/14 0530 03/08/14 0445 03/09/14 0450 03/10/14 0500  NA 138 139 140 140 139 137  K 3.9 4.8 3.8 4.4 3.9 3.9  CL 97 93* 96 97 94* 94*  CO2 23 21 25 25 27 28   GLUCOSE 136* 113* 124*  118* 129* 115*  BUN 92* 152* 80* 99* 45* 64*  CREATININE 5.99* 9.14* 5.98* 7.92* 4.82* 7.17*  ALBUMIN  --  2.0* 2.1* 2.0* 2.3* 2.1*  CALCIUM 8.5 8.6 8.5 8.7 9.1 8.9  PHOS 6.1* 10.8* 6.0* 8.9* 5.9* 8.0*   Liver Function Tests:  Recent Labs Lab 03/08/14 0445 03/09/14 0450 03/10/14 0500  ALBUMIN 2.0* 2.3* 2.1*   No results found for this basename: LIPASE, AMYLASE,  in the last 168 hours No results found for this basename: AMMONIA,  in the last 168 hours CBC:  Recent Labs Lab 03/04/14 0319 03/06/14 0430 03/07/14 0845 03/08/14 0445  WBC 14.8* 11.1* 9.0 10.1  NEUTROABS  --  8.6*  --  7.2  HGB 7.6* 7.4* 7.8* 7.6*  HCT 24.1* 23.8* 24.7* 24.4*  MCV 88.3 90.8 90.5 91.0  PLT 242 267 263 284   Cardiac Enzymes: No results found for this basename: CKTOTAL, CKMB, CKMBINDEX, TROPONINI,  in the last 168 hours CBG:  Recent Labs Lab 03/09/14 1110 03/09/14 1600 03/09/14 2052 03/10/14 0045 03/10/14 0437  GLUCAP 137* 115* 114* 118* 125*    Iron Studies: No results found for this basename: IRON, TIBC, TRANSFERRIN, FERRITIN,  in the last 72 hours Studies/Results: Dg Chest Port 1 View  03/09/2014   CLINICAL DATA:  Acute respiratory failure, hypoxia  EXAM: PORTABLE CHEST - 1 VIEW  COMPARISON:  Portable chest x-ray of March 07, 2014  FINDINGS: The lung volumes remain low.  The tracheostomy appliance tip lies along the superior margin of the clavicular heads. The pulmonary interstitial markings remain mildly increased. The cardiopericardial silhouette remains enlarged and the pulmonary vascularity is indistinct. There is no significant pleural effusion. A dialysis type dual lumen catheter is in place with the tips projecting over the midportion of the SVC.  IMPRESSION: There has not been significant interval change in the appearance of the chest since yesterday's study. Findings are consistent with congestive heart failure accentuated by bilateral pulmonary hypo inflation.   Electronically  Signed   By: David  [place]   On: 03/09/2014 12:51   . amLODipine  10 mg Per Tube Daily  . antiseptic oral rinse  7 mL Mouth Rinse QID  .  ceFAZolin (ANCEF) IV  2 g Intravenous Q M,W,F-1800  . chlorhexidine  15 mL Mouth Rinse BID  . cloNIDine  0.3 mg Transdermal Q Mon  . darbepoetin (ARANESP) injection - DIALYSIS  100 mcg Intravenous Q Mon-HD  . enalapril  5 mg Per Tube Daily  . feeding supplement (NEPRO CARB STEADY)  1,000 mL Per Tube Q24H  . heparin subcutaneous  5,000 Units Subcutaneous 3 times per day  . hydrALAZINE  100 mg Per Tube 4 times per day  . insulin aspart  0-15 Units Subcutaneous 6 times per day  . metoprolol tartrate  50 mg Per Tube BID  . multivitamin  5 mL Per Tube Daily  . pantoprazole sodium  40 mg Per Tube Q1200  . scopolamine  1 patch Transdermal Q72H    BMET    Component Value Date/Time   NA 137 03/10/2014 0500   K 3.9 03/10/2014 0500   CL 94* 03/10/2014 0500   CO2 28 03/10/2014 0500   GLUCOSE 115* 03/10/2014 0500   BUN 64* 03/10/2014 0500   CREATININE 7.17* 03/10/2014 0500   CALCIUM 8.9 03/10/2014 0500   GFRNONAA 6* 03/10/2014 0500   GFRAA 7* 03/10/2014 0500   CBC    Component Value Date/Time   WBC 10.1 03/08/2014 0445   RBC 2.68* 03/08/2014 0445   HGB 7.6* 03/08/2014 0445   HCT 24.4* 03/08/2014 0445   PLT 284 03/08/2014 0445   MCV 91.0 03/08/2014 0445   MCH 28.4 03/08/2014 0445   MCHC 31.1 03/08/2014 0445   RDW 17.8* 03/08/2014 0445   LYMPHSABS 1.3 03/08/2014 0445   MONOABS 1.3* 03/08/2014 0445   EOSABS 0.2 03/08/2014 0445   BASOSABS 0.1 03/08/2014 0445    Tawni CarnesWight, Andrew 8:18 AM  Renal Attending:  She remains critically ill with dialysis dependency.  For HD today.  I think this is ESRD given the small echogenic kidneys. Ana Liaw C

## 2014-03-10 NOTE — Progress Notes (Signed)
**Note De-Identified Lawarence Meek Obfuscation** Patient bagged and lavaged with NS per SPO2 65%.  Large bloody plug removed.  Patient placed back onto ATC; tolerated well.  RT to cont. To monitor.

## 2014-03-10 NOTE — Progress Notes (Signed)
**Note De-Identified Lynn Gentry Obfuscation** CPT not done at this time due patient being on dialysis

## 2014-03-10 NOTE — Progress Notes (Signed)
Hemodialysis Tx stopped 14 min early due to large clotts in the venous chamber. Was able to return blood to the pt. Goal of 3000 slight missed (pulled off 2758) due to coming off early, otherwise tolerated Tx without problems.

## 2014-03-10 NOTE — Progress Notes (Signed)
STROKE TEAM PROGRESS NOTE   HISTORY Lynn Gentry is an 43 y.o. female with no previously documented medical disorder who was brought to [facility] with new onset slurred speech and left-sided weakness. Patient went to work as usual until 1500. She was found by her son at home at 1600 unable to get up out of a chair with left sided weakness, slurred speech. Patient was last known well around 3 PM this afternoon 02/21/2014. CT scan of her head showed an acute 4.1 x 1.5 cm right basal ganglia hemorrhage with mild mass effect and right to left shift of 5 mm. Patient's blood pressure was markedly elevated at 226/111. She was started on Cardene drip and blood pressure responded well. She has shown continual respiratory difficulty. She was initially given oxygen by nasal cannula followed by NBR, and subsequently placed on BiPAP. She continued to have oxygen saturations only in the high 80s to low 90s on 100% O2, with a respiratory rates in the 40s. ABG showed pH of 2.82, PCO2 37 PO2 of 57. Chest x-ray showed mild cardiomegaly with possible asymmetric pulmonary edema. BNP was 36,455. Because of deteriorating respiratory status intubation and mechanical ventilation was elected. CCM service was consulted. Patient was not administered TPA secondary to ICH. She was admitted to the neuro ICU  for further evaluation and treatment.   SUBJECTIVE (INTERVAL HISTORY) Pt remains responsive today, open eyes on voice although not tracking and not following any commands. Withdraw to LLE more than other limbs on pain stimulation.  D/w  with Dr Kendrick Fries poor prognosis given MRI findings of moderate size left MCA infarct and small Pontine and subcortical infarcta as well in additon to prior right basal ganglia hemorrhage. MRI multifocal narrowing is worrisome vasculitis .Patient is not a candidate for biopsy for definitive diagnosis given her poor baseline and general medical condition.Peripheral blood markers for systemic  vasculitis are negative OBJECTIVE Temp:  [98.4 F (36.9 C)-99.3 F (37.4 C)] 98.9 F (37.2 C) (10/23 1210) Pulse Rate:  [68-131] 104 (10/23 1051) Cardiac Rhythm:  [-] Sinus tachycardia (10/23 1041) Resp:  [14-32] 25 (10/23 1051) BP: (117-171)/(55-155) 117/55 mmHg (10/23 1210) SpO2:  [95 %-100 %] 99 % (10/23 1210) FiO2 (%):  [28 %-40 %] 40 % (10/23 1048) Weight:  [246 lb 14.6 oz (112 kg)] 246 lb 14.6 oz (112 kg) (10/23 0500)   Recent Labs Lab 03/09/14 2052 03/10/14 0045 03/10/14 0437 03/10/14 0750 03/10/14 1157  GLUCAP 114* 118* 125* 119* 134*    Recent Labs Lab 03/04/14 0319 03/06/14 0430 03/07/14 0530 03/08/14 0445 03/09/14 0450 03/10/14 0500  NA 138 139 140 140 139 137  K 3.9 4.8 3.8 4.4 3.9 3.9  CL 97 93* 96 97 94* 94*  CO2 23 21 25 25 27 28   GLUCOSE 136* 113* 124* 118* 129* 115*  BUN 92* 152* 80* 99* 45* 64*  CREATININE 5.99* 9.14* 5.98* 7.92* 4.82* 7.17*  CALCIUM 8.5 8.6 8.5 8.7 9.1 8.9  MG 2.4  --   --   --   --   --   PHOS 6.1* 10.8* 6.0* 8.9* 5.9* 8.0*    Recent Labs Lab 03/06/14 0430 03/07/14 0530 03/08/14 0445 03/09/14 0450 03/10/14 0500  ALBUMIN 2.0* 2.1* 2.0* 2.3* 2.1*    Recent Labs Lab 03/04/14 0319 03/06/14 0430 03/07/14 0845 03/08/14 0445  WBC 14.8* 11.1* 9.0 10.1  NEUTROABS  --  8.6*  --  7.2  HGB 7.6* 7.4* 7.8* 7.6*  HCT 24.1* 23.8* 24.7* 24.4*  MCV 88.3 90.8 90.5 91.0  PLT 242 267 263 284   No results found for this basename: CKTOTAL, CKMB, CKMBINDEX, TROPONINI,  in the last 168 hours No results found for this basename: LABPROT, INR,  in the last 72 hours No results found for this basename: COLORURINE, APPERANCEUR, LABSPEC, PHURINE, GLUCOSEU, HGBUR, BILIRUBINUR, KETONESUR, PROTEINUR, UROBILINOGEN, NITRITE, LEUKOCYTESUR,  in the last 72 hours     Component Value Date/Time   CHOL 177 02/28/2014 0350   Lab Results  Component Value Date   HGBA1C 5.8* 02/23/2014   No results found for this basename: labopia,  cocainscrnur,   labbenz,  amphetmu,  thcu,  labbarb    No results found for this basename: ETH,  in the last 168 hours  Ct Head Wo Contrast 02/28/14 - Stable 4.2 x 1.1 cm right lenticular nucleus hematoma with mild  surrounding vasogenic edema. Local mass effect upon the right  lateral ventricle with slight bowing of the septum to the left by  3.9 mm without significant change.  This may represent result of hypertensive hemorrhage and can be  assessed as this clears.  Prominent white matter type changes asymmetric greater on the left  more conspicuous on the present exam than on the prior exam. The  patient may benefit from followup MR and MR angiogram for further  delineation. This may be ischemic in origin possibly related to  watershed type infarct. Other causes not excluded although felt to  be less likely.  02/24/2014 Stable appearance of the hematoma within the right basal ganglia. No new or progressive findings.  02/22/2014   1. No significant interval change in the patient's intraparenchymal hemorrhage at the right lateral basal ganglia, measuring 4.1 x 1.2 cm, with mild surrounding vasogenic edema. Mild associated mass effect, with 4 mm of leftward midline shift again seen. 2. Scattered small vessel ischemic microangiopathy. 3. Bilateral proptosis noted.     02/21/2014   1. Acute hemorrhage into the lateral right basal ganglia, likely indicating a hypertensive hemorrhage/hemorrhagic stroke. 2. Mild mass effect with shift of the midline to the left approximating 5 mm. Critical   US Renal Port 02/22/2014    1. Small echogenic kidneys consistent with chronic renal medical disease. No hydronephrosis. 2. The urinary bladder is decompressed by Foley catheter and cannot be evaluated.     Dg Chest Port 1 View 02/22/2014    1. Satisfactory positioning of endotracheal tube and right IJ line. 2. Cardiomegaly with persistent diffuse interstitial and airspace disease likely reflecting edema and congestive heart  failure. 3. Bilateral pleural effusions and basilar airspace disease. This likely reflects atelectasis.    02/21/2014    Mild cardiac enlargement and possible asymmetric pulmonary edema. However, exam is limited by body habitus.     2D echo - - Left ventricle: The cavity size was normal. There was severe concentric hypertrophy. Systolic function was normal. The estimated ejection fraction was in the range of 60% to 65%. Wall motion was normal; there were no regional wall motion abnormalities. - Left atrium: The atrium was mildly dilated. - Pulmonary arteries: PA peak pressure: 43 mm Hg (S). - Pericardium, extracardiac: A small, free-flowing pericardial effusion was identified posterior to the heart and circumferential to the heart. The fluid had no internal echoes.There was no evidence of hemodynamic compromise. Impressions: - The right ventricular systolic pressure was increased consistent with moderate pulmonary hypertension.  CUS - - Findings consistent with 1-39 percent stenosis involving the right internal carotid artery and the left internal  carotid artery. - Right vertebral artery not imaged due to central line dressing. Left vertebral artery not imaged due to patient position.  DVT - - No obvious evidence of deep vein or superficial thrombosis involving the right lower extremity and left lower extremity. - No evidence of Baker&'s cyst on the right or left.  EEG 02/28/14 - This EEG is abnormal with severe generalized slowing of cerebral activity consistent with severe encephalopathic state. These findings are nonspecific and can be seen with metabolic as well as toxic and degenerative encephalopathies. No evidence of an epileptic disorder was demonstrated. MRI Brain 03/08/2014 :1. Stable size and morphology of 4.5 x 1.4 x 2.8 cm subacute  intraparenchymal hematoma involving the right lentiform nucleus with  associated vasogenic edema and mass effect on the right lateral   ventricle. There is persistent 4 mm of right-to-left midline shift.  2. Acute left MCA territory infarct involving the deep white matter  of the left frontoparietal lobes. This is largely watershed in  distribution.  3. Additional small infarcts within the deep white matter of the  right frontal lobe, right parieto-occipital region, and left pons.  MRA HEAD :  1. Multi focal irregularity with associated stenoses within the  supra clinoid left ICA, left M1 segment, and possibly left A1  segment as above. Possible vasculitis or drug exposure could be  considered  A1C 5.8 and LDL 92  PHYSICAL EXAM Young obese Caucasian lady not in distress. On trach collar  . Afebrile. Head is nontraumatic. Neck is supple without bruit. Cardiac exam soft ejection murmur RRR .no gallop. Lungs bilateral crackles and  rhonchi to auscultation. Distal pulses are well felt.   Neurological Exam: on trach collar now, eyes open on voice but not tracking and not following commands.Aphasic Eyes in the middle. Pupils 3 mm reactive. Fundi not visualized. Left lower face weakness, corneal and gag present. On pain stimulation, she had trace withdraw to pain on RUE and RLE and LUE and more on LLE. Left Plantar upgoing and right down going.  ASSESSMENT/PLAN Lynn Gentry is a 43 y.o. female with no documented medical history found with new onset slurred speech and left-sided weakness. CT showed right external capsule hemorrhage. However, her condition getting worse with pulmonary edema, AKI requiring HD, respiratory failure needing ventilation. Transferred pt to CCM for better management.  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 Peripheral markers for systemic vasculitis and negative. Due to her general neurological and medical condition she is not a candidate at the present time for brain biopsy for definitive diagnosis of CNS vasculitis.Plan to check urine drug screen but patient is anuric hence will do in/out catheter of bladder  EEG showed severe slowing but no seizure.  2D echo unremarkable  Carotid doppler unremarkable  HgbA1c 5.8  Heparin subq for VTE prophylaxis  Resultant VDRF, left hemiparesis  no antithrombotics prior to admission, currently not on antithrombotics due to hemorrhage.  Ongoing aggressive risk factor management  Clinical improving  Acute Respiratory Failure with severe acute pulmonary edema  trach on 03/02/14  CCM following  Diuresing, IV Lasix, Hemodialysis, urine output minimal today  CXR series  Malignant Hypertension  BP 226/111 at onset  Home meds:   none  SBP goal < 150 due  to presence of end organ damage  On amlodipine, clonidine patch and metoprolol and hydralazine  off cardene   Acute renal failure with volume overload, suspect baseline chronic kidney disease  Diuresis and management per critical care, on HD  Continue to trend Cre.  HD as per nephrology  Other Stroke Risk Factors ETOH use   Morbid Obesity, Body mass index is 42.36 kg/(m^2).   Hospital day # 17 03/10/2014 1:45 PM   D/w Dr Kendrick FriesMcQuaid.   Long 20 minute discussion with husband, mother and other family members regarding her poor prognosis, discussed findings of MRI and MRA and concern for CNS vasculitis. Patient is not a candidate for brain biopsy and catheter angiogram would be risky given her renal failure. The family understands. They will  think about doing the catheter angiogram for the next couple of days and if they wish this can be arranged early next week. I will also discuss the risk of  Iv contrast and her renal function with nephrologist. Patient may need high-dose  steroids if a catheter angiogram suggests CNS vasculitis. Delia HeadyPramod Carlyn Mullenbach, MD Stroke Neurology 03/10/2014 1:45 PM   To contact Stroke Continuity provider, please refer to [email or web address]. After hours, contact General Neurology

## 2014-03-10 NOTE — Progress Notes (Signed)
PULMONARY / CRITICAL CARE MEDICINE   Name: Lynn Gentry MRN: [id number] DOB: 02-01-1971    ADMISSION DATE:  02/21/2014 CONSULTATION DATE:  03/10/2014  REFERRING MD :  Pearlean BrownieSethi  CHIEF COMPLAINT:  Respiratory failure in setting of ICH  INITIAL PRESENTATION: 43 [facility]. F [facility] with left sided weakness, slurred speech, inability to stand up.  In ED, found to have acute lateral right basal ganglia ICH with mild mass effect and 5mm MLS.  Admitted to neuro ICU and developed acute respiratory failure, failed bipap and PCCM called for intubation and management. She was admitted to neuro ICU and later that evening had increasing O2 demands.  She had no improvement in SpO2, work of breathing, and mental status despite 3 hours of BiPAP.  PCCM was consulted for intubation.  STUDIES:  10/06 CT head:  acute hemorrhage into the lateral right basal ganglia, likely indicating a hypertensive hemorrhage/hemorrhagic stroke.  Mild mass effect with 5mm MLS to the left. 10/07 Renal US:  echogenic kidneys s/w ckd, no hydro.  [facility]07 Doppler carotid: consistent with 1-39% stenosis involving the R internal carotid art and the L internal carotid art 10/07 TTE: LVEF 60-65%. Severe concentric LVH. LA dilatation. PASP est 43 mmHg 10[facility] 10/09 LE venous Dopplers (ordered by Stroke) >> negative 10/21 MRI / MRA brain >> stable subacute intraparenchymal hemorrhage, localized vasogenic edema, mild mass effect, 4 mm R to L shift, acute L MCA infarct, largely watershed in distribution, additional small infarcts in deep white matter   SIGNIFICANT EVENTS: 10/06  Admitted for ICH, developed respiratory failure requiring intubation. 10/07  Persistent hypertension. Remained on nicardipine gtt. Persistent volume overload snd pulm edema pattern on CXR. Lasix gtt resulted in minimal increase in Uo 10/08  Worsening renal function, oliguria. Renal consult. HD  performed 10/09  Acute change in neuro status. STAT CT [facility]  Further HD planned 10/14  HD with negative fluid balance 10/15  Trach (JY) 10/16  Febrile and purulent matter from nasal cavity and trach. 10/17  Transition from fentanyl gtt to Duragesic patch. Begin PSV weaning 10/18  Transfer to vent SDU bed ordered. Tolerating PSV 10 cm H2O 10/19  Transfer to SDU/2900 10/21  Off vent 36hours, but went back on in evening for secretions/tachypnea; Persistent diarrhea, d/c'd senokot, [place] from [place] explaining dismal prognosis 10/22  Mucus plugging this AM; Neurology had discussion with family explaining dismal prognosis, family desires full code  SUBJ - mucus plugging this AM requiring suctioning, back on vent after only 2-3 hours of ATC, Neurology had discussion with family explaining dismal prognosis, family desires full code  VITAL SIGNS: Temp:  [98.4 F (36.9 C)-99.3 F (37.4 C)] 98.4 F (36.9 C) (10/23 0750) Pulse Rate:  [68-131] 104 (10/23 1051) Resp:  [14-32] 25 (10/23 1051) BP: (132-171)/(62-155) 150/73 mmHg (10/23 1049) SpO2:  [95 %-100 %] 99 % (10/23 1051) FiO2 (%):  [28 %-40 %] 40 % (10/23 1048) Weight:  [112 kg (246 lb 14.6 oz)] 112 kg (246 lb 14.6 oz) (10/23 0500)  VENTILATOR SETTINGS: Vent Mode:  [-] PRVC FiO2 (%):  [28 %-40 %] 40 % Set Rate:  [15 bmp] 15 bmp Vt Set:  [500 mL] 500 mL PEEP:  [5 cmH20] 5 cmH20 Plateau Pressure:  [23 cmH20-25 cmH20] 25 cmH20  INTAKE / OUTPUT: Intake/Output     10/22 0701 - 10/23 0700 10/23 0701 - 10/24 0700  Other 300    NG/GT 920 120   Total Intake(mL/kg) 1220 (10.9) 120 (1.1)   Other     Total Output       Net +1220 +120        Urine Occurrence 1 x     PHYSICAL EXAMINATION:  Gen: only opens eyes to voice HEENT: Trach site c/d/i but secretions noted PULM: rhonchi L > R CV: distant tones RRR, no mgr AB: BS+, soft, nontender, PEG Ext: notable pitting foot edema Neuro: opens eyes to voice,  no movement of extremities on my exam  LABS:  I have reviewed all of today's lab results. Relevant abnormalities are discussed in the A/P section  10/22 CXR > stable bilateral airspace disease  ASSESSMENT / PLAN:  PULMONARY ETT 10/6 >> 10/15 Trach tube 10/15 >>  A: Acute hypoxemic respiratory failure > nearly completely off vent support HCAP> MSSA Mucus plugging 10/22 P:   Add guaifenesin Chest PT Continue ATC during daytime, vent at night Cont vent bundle  CARDIOVASCULAR CVL R IJ 10/6 >>  A:  HTN persistent Diastolic CHF > currently mild volume overload P:  Place peripheral IV, remove CVL Continue hydralazine, clonidine, metoprolol, norvasc, enalapril PRN hydralazine to maintain SBP < 170 mmHg PRN metoprolol to maintain HR < 115/min  RENAL L IJ HD cath 10/08 >> 10/20 Perm Cath L Rebecca 10/20 >> A:   Acute renal failure> now ESRD  P:   Monitor BMET per renal HD per renal > volume removal as tolerated  GASTROINTESTINAL A:   Obesity P:   SUP: Pantoprazole Cont TFs  HEMATOLOGIC A:   ICU acquired anemia P:  DVT px: SQ heparin Monitor CBC intermittently Transfuse per usual ICU guidelines  INFECTIOUS A:   HCAP > MSSA P:   Resp 10/17 >> Abundant staph aureus MSSA Blood 10/16 >> ng C.diff 10/20 >> neg  Fluconazole 10/15 >> 10/17 Vancomycin 10/16 >> plan 7 days Zosyn 10/16 >> 10/20 Ancef 10/21 >> MWF with HD plan    ENDOCRINE A:   Hyperglycemia, controlled P:   CBG's q4hr Cont SSI  NEUROLOGIC A:   Hypertensive R basal Ganglion Hemorrhage  Acute L MCA stroke Effective quadriplegic at this point Acute Encephalopathy due to strokes > overall prognosis is grim with little chance of meaningful neurologic recovery P:   Mgmt per Stroke team Stop fentanyl patch  Cont PRN fentanyl  RASS goal: 0   FAMILY: I updated family at length on 10/22 over poor prognosis and Neurology did the same on 10/23.  The husband desires full code at this point.   Palliative medicine to see them today.  They may just need time to accept this as they just learned of the MRI findings and poor prognosis yesterday evening.  However if no progress with code status after discussion with palliative medicine may need ethics consult.  TODAY'S SUMMARY: Dismal prognosis, needs better pulm toilette, continue ATC, vent at night, palliative to see today  Heber Carlton, MD [place] P[facility]M Pager: [phone] Cell: [phone] If no response, call [phone]

## 2014-03-11 LAB — GLUCOSE, CAPILLARY
Glucose-Capillary: 102 mg/dL — ABNORMAL HIGH (ref 70–99)
Glucose-Capillary: 105 mg/dL — ABNORMAL HIGH (ref 70–99)
Glucose-Capillary: 118 mg/dL — ABNORMAL HIGH (ref 70–99)
Glucose-Capillary: 136 mg/dL — ABNORMAL HIGH (ref 70–99)
Glucose-Capillary: 119 mg/dL — ABNORMAL HIGH (ref 70–99)
Glucose-Capillary: 131 mg/dL — ABNORMAL HIGH (ref 70–99)

## 2014-03-11 LAB — RENAL FUNCTION PANEL
ANION GAP: 16 — AB (ref 5–15)
Albumin: 2.4 g/dL — ABNORMAL LOW (ref 3.5–5.2)
BUN: 38 mg/dL — ABNORMAL HIGH (ref 6–23)
CO2: 26 mEq/L (ref 19–32)
Calcium: 9.1 mg/dL (ref 8.4–10.5)
Chloride: 95 mEq/L — ABNORMAL LOW (ref 96–112)
Creatinine, Ser: 5.1 mg/dL — ABNORMAL HIGH (ref 0.50–1.10)
GFR calc Af Amer: 11 mL/min — ABNORMAL LOW (ref >90)
GFR calc non Af Amer: 9 mL/min — ABNORMAL LOW (ref >90)
GLUCOSE: 109 mg/dL — AB (ref 70–99)
POTASSIUM: 4.2 meq/L (ref 3.7–5.3)
Phosphorus: 4.9 mg/dL — ABNORMAL HIGH (ref 2.3–4.6)
SODIUM: 137 meq/L (ref 137–147)

## 2014-03-11 LAB — CULTURE, RESPIRATORY

## 2014-03-11 LAB — CBC
HCT: 29.2 % — ABNORMAL LOW (ref 36.0–46.0)
HEMOGLOBIN: 8.9 g/dL — AB (ref 12.0–15.0)
MCH: 28.6 pg (ref 26.0–34.0)
MCHC: 30.5 g/dL (ref 30.0–36.0)
MCV: 93.9 fL (ref 78.0–100.0)
Platelets: 379 10*3/uL (ref 150–400)
RBC: 3.11 MIL/uL — ABNORMAL LOW (ref 3.87–5.11)
RDW: 18.9 % — AB (ref 11.5–15.5)
WBC: 13.1 10*3/uL — ABNORMAL HIGH (ref 4.0–10.5)

## 2014-03-11 LAB — CULTURE, RESPIRATORY W GRAM STAIN
Culture: NO GROWTH
Special Requests: NORMAL

## 2014-03-11 NOTE — Progress Notes (Signed)
Patient ID: Lynn Gentry, female    DOB: 08/12/1970, 43 y.o.   MRN: [id number] Nephrology Progress Note  Assessment/Plan: 43 y.o. female with no significant known PMH admitted for right basal ganglia hemorrhage, underwent intubation for declining respiratory status and oliguric, started on HD.   1. AKI vs. Subacute vs. AKI/CKD- oliguric requiring dialysis. S/p HD treatments with marked improvement of volume and BP  1. Cont with HD MWF. 2. Remains oliguric and dialysis-dependent  3. Renal U/S suggests she is end-stage; ok to do the catheter angiogram to eval for CNS vasculitis if deemed appropriate by neuro.  2. Vascular access: TDC placed left IJ 10/20. 3. 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 Elevated phos. 5. Neuro- MRI 10/21 with stable hematoma lentiform nucleas, acute left MCA infarct, small infarcts right frontal, parieto-occiptal, left pons 6. VDRF- per PCCM  7. Malignant HTN and volume overload- improving with aggressive HD and UF.   Renal Attending:  She remains dialysis dependent. She is VDRF. She is s/p CVAs.  We will continue to support with dialysis. Silas Sedam C   _________________________________________________________________ S: Eyes open, does not track.   O:BP 149/81  Pulse 95  Temp(Src) 98.8 F (37.1 C) (Oral)  Resp 19  Ht 5\' 4"  (1.626 m)  Wt 239 lb 6.7 oz (108.6 kg)  BMI 41.08 kg/m2  SpO2 100%  LMP 03/01/2014  Intake/Output Summary (Last 24 hours) at 03/11/14 0740 Last data filed at 03/11/14 0700  Gross per 24 hour  Intake   1430 ml  Output   2908 ml  Net  -1478 ml   Intake/Output: I/O last 3 completed shifts: In: 2130 [I.V.:120; Other:600; NG/GT:1360; IV Piggyback:50] Out: 2908 [Other:2758; Stool:150]  Intake/Output this shift:    Weight change: -5 lb 1.1 oz (-2.3 kg) Gen: NAD HEENT: dilated pupils bilaterally, minimally reactive to light CVS: RRR, no mrg Resp: transmitted upper airway sounds Abd: soft, obese, +BS Ext: 1+ pedal  edema Neuro: opens eyes to tactile and verbal stimuli. Raises left hand, moved left foot   Recent Labs Lab 03/06/14 0430 03/07/14 0530 03/08/14 0445 03/09/14 0450 03/10/14 0500 03/11/14 0345  NA 139 140 140 139 137 137  K 4.8 3.8 4.4 3.9 3.9 4.2  CL 93* 96 97 94* 94* 95*  CO2 21 25 25 27 28 26   GLUCOSE 113* 124* 118* 129* 115* 109*  BUN 152* 80* 99* 45* 64* 38*  CREATININE 9.14* 5.98* 7.92* 4.82* 7.17* 5.10*  ALBUMIN 2.0* 2.1* 2.0* 2.3* 2.1* 2.4*  CALCIUM 8.6 8.5 8.7 9.1 8.9 9.1  PHOS 10.8* 6.0* 8.9* 5.9* 8.0* 4.9*   Liver Function Tests:  Recent Labs Lab 03/09/14 0450 03/10/14 0500 03/11/14 0345  ALBUMIN 2.3* 2.1* 2.4*   No results found for this basename: LIPASE, AMYLASE,  in the last 168 hours No results found for this basename: AMMONIA,  in the last 168 hours CBC:  Recent Labs Lab 03/06/14 0430 03/07/14 0845 03/08/14 0445 03/11/14 0345  WBC 11.1* 9.0 10.1 13.1*  NEUTROABS 8.6*  --  7.2  --   HGB 7.4* 7.8* 7.6* 8.9*  HCT 23.8* 24.7* 24.4* 29.2*  MCV 90.8 90.5 91.0 93.9  PLT 267 263 284 379   Cardiac Enzymes: No results found for this basename: CKTOTAL, CKMB, CKMBINDEX, TROPONINI,  in the last 168 hours CBG:  Recent Labs Lab 03/10/14 1157 03/10/14 1657 03/10/14 1941 03/11/14 0031 03/11/14 0343  GLUCAP 134* 118* 131* 102* 105*    Iron Studies: No results found for this basename: IRON,  TIBC, TRANSFERRIN, FERRITIN,  in the last 72 hours Studies/Results: Dg Chest Port 1 View  03/09/2014   CLINICAL DATA:  Acute respiratory failure, hypoxia  EXAM: PORTABLE CHEST - 1 VIEW  COMPARISON:  Portable chest x-ray of March 07, 2014  FINDINGS: The lung volumes remain low. The tracheostomy appliance tip lies along the superior margin of the clavicular heads. The pulmonary interstitial markings remain mildly increased. The cardiopericardial silhouette remains enlarged and the pulmonary vascularity is indistinct. There is no significant pleural effusion. A dialysis  type dual lumen catheter is in place with the tips projecting over the midportion of the SVC.  IMPRESSION: There has not been significant interval change in the appearance of the chest since yesterday's study. Findings are consistent with congestive heart failure accentuated by bilateral pulmonary hypo inflation.   Electronically Signed   By: David  [place]   On: 03/09/2014 12:51   . amLODipine  10 mg Per Tube Daily  . antiseptic oral rinse  7 mL Mouth Rinse QID  .  ceFAZolin (ANCEF) IV  2 g Intravenous Q M,W,F-1800  . chlorhexidine  15 mL Mouth Rinse BID  . cloNIDine  0.3 mg Transdermal Q Mon  . darbepoetin (ARANESP) injection - DIALYSIS  100 mcg Intravenous Q Mon-HD  . enalapril  5 mg Per Tube Daily  . feeding supplement (NEPRO CARB STEADY)  1,000 mL Per Tube Q24H  . guaiFENesin  10 mL Per Tube TID  . heparin subcutaneous  5,000 Units Subcutaneous 3 times per day  . hydrALAZINE  100 mg Per Tube 4 times per day  . insulin aspart  0-15 Units Subcutaneous 6 times per day  . metoprolol tartrate  50 mg Per Tube BID  . multivitamin  5 mL Per Tube Daily  . pantoprazole sodium  40 mg Per Tube Q1200  . scopolamine  1 patch Transdermal Q72H    BMET    Component Value Date/Time   NA 137 03/11/2014 0345   K 4.2 03/11/2014 0345   CL 95* 03/11/2014 0345   CO2 26 03/11/2014 0345   GLUCOSE 109* 03/11/2014 0345   BUN 38* 03/11/2014 0345   CREATININE 5.10* 03/11/2014 0345   CALCIUM 9.1 03/11/2014 0345   GFRNONAA 9* 03/11/2014 0345   GFRAA 11* 03/11/2014 0345   CBC    Component Value Date/Time   WBC 13.1* 03/11/2014 0345   RBC 3.11* 03/11/2014 0345   HGB 8.9* 03/11/2014 0345   HCT 29.2* 03/11/2014 0345   PLT 379 03/11/2014 0345   MCV 93.9 03/11/2014 0345   MCH 28.6 03/11/2014 0345   MCHC 30.5 03/11/2014 0345   RDW 18.9* 03/11/2014 0345   LYMPHSABS 1.3 03/08/2014 0445   MONOABS 1.3* 03/08/2014 0445   EOSABS 0.2 03/08/2014 0445   BASOSABS 0.1 03/08/2014 0445    Tawni CarnesWight, Andrew 7:40  AM

## 2014-03-11 NOTE — Progress Notes (Signed)
PULMONARY / CRITICAL CARE MEDICINE   Name: Lynn NationsStephanie Riemann MRN: [id number] DOB: 06/27/1970    ADMISSION DATE:  02/21/2014 CONSULTATION DATE:  03/11/2014  REFERRING MD :  Pearlean BrownieSethi  CHIEF COMPLAINT:  Respiratory failure in setting of ICH  INITIAL PRESENTATION: 43 y.o. F [facility] with left sided weakness, slurred speech, inability to stand up.  In ED, found to have acute lateral right basal ganglia ICH with mild mass effect and 5mm MLS.  Admitted to neuro ICU and developed acute respiratory failure, failed bipap and PCCM called for intubation and management. She was admitted to neuro ICU and later that evening had increasing O2 demands.  She had no improvement in SpO2, work of breathing, and mental status despite 3 hours of BiPAP.  PCCM was consulted for intubation.  STUDIES:  10/06 CT head:  acute hemorrhage into the lateral right basal ganglia, likely indicating a hypertensive hemorrhage/hemorrhagic stroke.  Mild mass effect with 5mm MLS to the left. 10/07 Renal US:  echogenic kidneys s/w ckd, no hydro.  10/07 CT [facility]07 Doppler carotid: consistent with 1-39% stenosis involving the R internal carotid art and the L internal carotid art 10/07 TTE: LVEF 60-65%. Severe concentric LVH. LA dilatation. PASP est 43 mmHg [facility]09 LE venous Dopplers (ordered by Stroke) >> negative 10/21 MRI / MRA brain >> stable subacute intraparenchymal hemorrhage, localized vasogenic edema, mild mass effect, 4 mm R to L shift, acute L MCA infarct, largely watershed in distribution, additional small infarcts in deep white matter   SIGNIFICANT EVENTS: 10/06  Admitted for ICH, developed respiratory failure requiring intubation. 10/07  Persistent hypertension. Remained on nicardipine gtt. Persistent volume overload snd pulm edema pattern on CXR. Lasix gtt resulted in minimal increase in Uo 10/08  Worsening renal function, oliguria. Renal consult. HD  performed 10/09  Acute change in neuro status. STAT [facility]  Further HD planned 10/14  HD with negative fluid balance 10/15  Trach (JY) 10/16  Febrile and purulent matter from nasal cavity and trach. 10/17  Transition from fentanyl gtt to Duragesic patch. Begin PSV weaning 10/18  Transfer to vent SDU bed ordered. Tolerating PSV 10 cm H2O 10/19  Transfer to SDU/2900 10/21  Off vent 36hours, but went back on in evening for secretions/tachypnea; Persistent diarrhea, d/c'd senokot, [facility] explaining dismal prognosis 10/22  Mucus plugging this AM; Neurology had discussion with family explaining dismal prognosis, family desires full code 10/23- active pall care discussions  SUBJ -no change in neuro status   VITAL SIGNS: Temp:  [98 F (36.7 C)-99.1 F (37.3 C)] 99 F (37.2 C) (10/24 0755) Pulse Rate:  [53-118] 118 (10/24 0845) Resp:  [15-32] 32 (10/24 0845) BP: (99-174)/(50-98) 148/60 mmHg (10/24 0800) SpO2:  [98 %-100 %] 98 % (10/24 0845) FiO2 (%):  [40 %] 40 % (10/24 0845) Weight:  [107.1 kg (236 lb 1.8 oz)-109.7 kg (241 lb 13.5 oz)] 108.6 kg (239 lb 6.7 oz) (10/24 0355)  VENTILATOR SETTINGS: Vent Mode:  [-] PRVC FiO2 (%):  [40 %] 40 % Set Rate:  [15 bmp] 15 bmp Vt Set:  [500 mL] 500 mL PEEP:  [5 cmH20] 5 cmH20 Plateau Pressure:  [25 cmH20-28 cmH20] 28 cmH20  INTAKE / OUTPUT: Intake/Output     10/23 0701 - 10/24 0700 10/24 0701 - 10/25 0700   I.V. (mL/kg) 120 (1.1)    Other 340    NG/GT 920  IV Piggyback 50    Total Intake(mL/kg) 1430 (13.2)    Other 2758    Stool 150    Total Output 2908     Net -1478           PHYSICAL EXAMINATION:  Gen: only opens eyes to voice HEENT: Trach site clean PULM: ronchi unchanged CV: distant tones RRR, no mgr AB: BS+, soft, nontender, PEG Ext: 2 plus  edema Neuro: opens eyes to voice, no movement of extremities on my exam, no changes  LABS:  I have reviewed all of today's lab  results. Relevant abnormalities are discussed in the A/P section  10/22 CXR > stable bilateral airspace disease  ASSESSMENT / PLAN:  PULMONARY ETT 10/6 >> 10/15 Trach tube 10/15 >>  A: Acute hypoxemic respiratory failure > nearly completely off vent support HCAP> MSSA Mucus plugging 10/22 Edema component P:   Add guaifenesin Chest PT Trach collar on hold due to mucous plug Attempt with PS CPAP 10/5 , pos pressure , goal 4 hrs Cont vent bundle Neg balance on HD needed  CARDIOVASCULAR CVL R IJ 10/6 >>  A:  HTN persistent Diastolic CHF > currently mild volume overload P:  Place peripheral IV not successful, remove CVL once edema improved Continue hydralazine, clonidine, metoprolol, norvasc, enalapril PRN hydralazine to maintain SBP < 170 mmHg PRN metoprolol to maintain HR < 115/min  RENAL L IJ HD cath 10/08 >> 10/20 Perm Cath L [place] 10/20 >> A:   Acute renal failure> now ESRD  P:   Neg balance goals noted pcxr reveals continued fluids  GASTROINTESTINAL A:   Obesity P:   SUP: Pantoprazole Cont TFs tolerated well  HEMATOLOGIC A:   ICU acquired anemia P:  DVT px: SQ heparin Monitor CBC intermittently Transfuse per usual ICU guidelines  INFECTIOUS A:   HCAP > MSSA P:   Resp 10/17 >> Abundant staph aureus MSSA Blood 10/16 >> ng C.diff 10/20 >> neg  Fluconazole 10/15 >> 10/17 Vancomycin 10/16 >> plan 7 days Zosyn 10/16 >> 10/20 Ancef 10/21 >> MWF with HD plan   ENDOCRINE A:   Hyperglycemia, controlled P:   CBG's q4hr Cont SSI  NEUROLOGIC A:   Hypertensive R basal Ganglion Hemorrhage  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   For goals in am   TODAY'S SUMMARY: Dismal prognosis, continued hd, vent, heroics is completely medically ineffective and she is suffering through this with no benefit. We are NOT obligated to continue such futile care, attempting cpap ps  Mcarthur Rossetti. Tyson Alias, MD, FACP Pgr: [phone] [facility]

## 2014-03-11 NOTE — Progress Notes (Signed)
Bed CPT not performed because patient is not in an ICU Bed.

## 2014-03-11 NOTE — Progress Notes (Signed)
ANTIBIOTIC CONSULT NOTE - FOLLOW UP  Pharmacy Consult for Cefazolin Indication: MSSA PNA  No Known Allergies  Patient Measurements: Height: 5\' 4"  (162.6 cm) Weight: 239 lb 6.7 oz (108.6 kg) IBW/kg (Calculated) : 54.7  Vital Signs: Temp: 99 F (37.2 C) (10/24 0755) Temp Source: Oral (10/24 0755) BP: 148/60 mmHg (10/24 0800) Pulse Rate: 118 (10/24 0845) Intake/Output from previous day: 10/23 0701 - 10/24 0700 In: 1430 [I.V.:120; NG/GT:920; IV Piggyback:50] Out: 2908 [Stool:150] Intake/Output from this shift:    Labs:  Recent Labs  03/09/14 0450 03/10/14 0500 03/11/14 0345  WBC  --   --  13.1*  HGB  --   --  8.9*  PLT  --   --  379  CREATININE 4.82* 7.17* 5.10*   Estimated Creatinine Clearance: 17.1 ml/min (by C-G formula based on Cr of 5.1). No results found for this basename: VANCOTROUGH, Leodis BinetVANCOPEAK, VANCORANDOM, GENTTROUGH, GENTPEAK, GENTRANDOM, TOBRATROUGH, TOBRAPEAK, TOBRARND, AMIKACINPEAK, AMIKACINTROU, AMIKACIN,  in the last 72 hours   Microbiology: Recent Results (from the past 720 hour(s))  MRSA PCR SCREENING     Status: Abnormal   Collection Time    02/21/14  9:37 PM      Result Value Ref Range Status   MRSA by PCR POSITIVE (*) NEGATIVE Final   Comment:            The GeneXpert MRSA Assay (FDA     approved for NASAL specimens     only), is one component of a     comprehensive MRSA colonization     surveillance program. It is not     intended to diagnose MRSA     infection nor to guide or     monitor treatment for     MRSA infections.     RESULT CALLED TO, READ BACK BY AND VERIFIED WITH:     M.BOULDEN,RN 0154 02/22/14 M.CAMPBELL  CULTURE, BLOOD (ROUTINE X 2)     Status: None   Collection Time    03/03/14 10:02 AM      Result Value Ref Range Status   Specimen Description BLOOD RIGHT HAND   Final   Special Requests BOTTLES DRAWN AEROBIC ONLY 3CC   Final   Culture  Setup Time     Final   Value: 03/03/2014 17:08     [facility]    Culture     Final   Value: NO GROWTH 5 DAYS     Performed at [facility]   Report Status 03/09/2014 FINAL   Final  CULTURE, BLOOD (ROUTINE X 2)     Status: None   Collection Time    03/03/14  2:30 PM      Result Value Ref Range Status   Specimen Description BLOOD HEMODIALYSIS CATHETER   Final   Special Requests BOTTLES DRAWN AEROBIC AND ANAEROBIC 10CC   Final   Culture  Setup Time     Final   Value: 03/03/2014 20:41     Performed at [facility]   Culture     Final   Value: NO GROWTH 5 DAYS     Performed at [facility]   Report Status 03/09/2014 FINAL   Final  CULTURE, RESPIRATORY (NON-EXPECTORATED)     Status: None   Collection Time    03/04/14  9:30 AM      Result Value Ref Range Status   Specimen Description TRACHEAL ASPIRATE   Final   Special Requests NONE   Final  Gram Stain     Final   Value: ABUNDANT WBC PRESENT,BOTH PMN AND MONONUCLEAR     FEW SQUAMOUS EPITHELIAL CELLS PRESENT     MODERATE GRAM POSITIVE COCCI IN PAIRS     FEW GRAM POSITIVE RODS     Performed at [facility]   Culture     Final   Value: ABUNDANT STAPHYLOCOCCUS AUREUS     Note: RIFAMPIN AND GENTAMICIN SHOULD NOT BE USED AS SINGLE DRUGS FOR TREATMENT OF STAPH INFECTIONS.     Performed at [facility]   Report Status 03/08/2014 FINAL   Final   Organism ID, Bacteria STAPHYLOCOCCUS AUREUS   Final  CLOSTRIDIUM DIFFICILE BY PCR     Status: None   Collection Time    03/06/14  3:00 AM      Result Value Ref Range Status   C difficile by pcr NEGATIVE  NEGATIVE Final  CULTURE, RESPIRATORY (NON-EXPECTORATED)     Status: None   Collection Time    03/09/14 11:51 AM      Result Value Ref Range Status   Specimen Description TRACHEAL ASPIRATE   Final   Special Requests Normal   Final   Gram Stain     Final   Value: FEW WBC PRESENT, PREDOMINANTLY PMN     RARE SQUAMOUS EPITHELIAL CELLS PRESENT     NO ORGANISMS SEEN     Performed at [facility]   Culture      Final   Value: NO GROWTH 1 DAY     Performed at [facility]   Report Status PENDING   Incomplete    Anti-infectives   Start     Dose/Rate Route Frequency Ordered Stop   03/10/14 1800  ceFAZolin (ANCEF) IVPB 2 g/50 mL premix     2 g 100 mL/hr over 30 Minutes Intravenous Every M-W-F (1800) 03/08/14 1132     03/08/14 1600  ceFAZolin (ANCEF) IVPB 2 g/50 mL premix     2 g 100 mL/hr over 30 Minutes Intravenous  Once 03/08/14 1132 03/08/14 1722   03/07/14 1130  vancomycin (VANCOCIN) 1,500 mg in sodium chloride 0.9 % 500 mL IVPB     1,500 mg 250 mL/hr over 120 Minutes Intravenous  Once 03/07/14 1121 03/07/14 1422   03/06/14 1200  vancomycin (VANCOCIN) IVPB 1000 mg/200 mL premix  Status:  Discontinued     1,000 mg 200 mL/hr over 60 Minutes Intravenous Every M-W-F (Hemodialysis) 03/05/14 1338 03/06/14 0902   03/06/14 0600  ceFAZolin (ANCEF) IVPB 2 g/50 mL premix    Comments:  hang ON CALL to xray 10/19   2 g 100 mL/hr over 30 Minutes Intravenous On call 03/04/14 1117 03/06/14 0726   03/04/14 1600  vancomycin (VANCOCIN) IVPB 1000 mg/200 mL premix     1,000 mg 200 mL/hr over 60 Minutes Intravenous  Once 03/04/14 1502 03/04/14 1654   03/03/14 1000  piperacillin-tazobactam (ZOSYN) IVPB 2.25 g  Status:  Discontinued     2.25 g 100 mL/hr over 30 Minutes Intravenous 3 times per day 03/03/14 0917 03/07/14 1121   03/03/14 1000  vancomycin (VANCOCIN) 1,500 mg in sodium chloride 0.9 % 500 mL IVPB     1,500 mg 250 mL/hr over 120 Minutes Intravenous  Once 03/03/14 0918 03/03/14 1213   03/02/14 1300  fluconazole (DIFLUCAN) IVPB 200 mg  Status:  Discontinued     200 mg 100 mL/hr over 60 Minutes Intravenous Every 24 hours 03/02/14 1205 03/04/14 0932  Assessment: 43yof with MSSA PNA to have antibiotics changed from Vancomycin (10/16-10/21, also on Zosyn 10/16-10/20) to Cefazolin. Patient is currently afebrile, WBC slightly elevated at 13.1 and blood cultures remain NGTD. Patient has AKI/CKD  but is requiring HD - per Renal notes, continuing HD MWF for now. Will plan to continue Cefazolin 2g with each HD and monitor HD schedule and antibiotic length of therapy.  Plan:  1. Cefazolin 2g IV qHD (MWF) 2. Monitor HD schedule, Renal plans, cultures and clinical course 3. Follow-up antibiotic length of therapy  Margie Billet, PharmD Clinical Pharmacist - Resident Pager: [phone] Pharmacy: [phone] 03/11/2014 11:15 AM

## 2014-03-11 NOTE — Progress Notes (Addendum)
18:00 pm hydralazine held per Dr's order . See VS section ( b/p's steadily decling this shift -- eventhough am hydralazine was held- ) .

## 2014-03-11 NOTE — Progress Notes (Signed)
STROKE TEAM PROGRESS NOTE   HISTORY Lynn Gentry is an 43 y.o. female with no previously documented medical disorder who was brought to [facility] with new onset slurred speech and left-sided weakness. Patient went to work as usual until 1500. She was found by her son at home at 1600 unable to get up out of a chair with left sided weakness, slurred speech. Patient was last known well around 3 PM this afternoon 02/21/2014. CT scan of her head showed an acute 4.1 x 1.5 cm right basal ganglia hemorrhage with mild mass effect and right to left shift of 5 mm. Patient's blood pressure was markedly elevated at 226/111. She was started on Cardene drip and blood pressure responded well. She has shown continual respiratory difficulty. She was initially given oxygen by nasal cannula followed by NBR, and subsequently placed on BiPAP. She continued to have oxygen saturations only in the high 80s to low 90s on 100% O2, with a respiratory rates in the 40s. ABG showed pH of 2.82, PCO2 37 PO2 of 57. Chest x-ray showed mild cardiomegaly with possible asymmetric pulmonary edema. BNP was 36,455. Because of deteriorating respiratory status intubation and mechanical ventilation was elected. CCM service was consulted. Patient was not administered TPA secondary to ICH. She was admitted to the neuro ICU  for further evaluation and treatment.   SUBJECTIVE (INTERVAL HISTORY) Pt remains responsive today, open eyes  to voice although not tracking and not following any commands. Withdraw to LLE more than other limbs on pain stimulation.   OBJECTIVE Temp:  [98 F (36.7 C)-99.3 F (37.4 C)] 99.3 F (37.4 C) (10/24 1135) Pulse Rate:  [53-118] 72 (10/24 1135) Cardiac Rhythm:  [-] Normal sinus rhythm (10/24 0800) Resp:  [15-32] 16 (10/24 1135) BP: (99-174)/(50-98) 114/57 mmHg (10/24 1135) SpO2:  [98 %-100 %] 100 % (10/24 1135) FiO2 (%):  [40 %] 40 % (10/24 0845) Weight:  [236 lb 1.8 oz (107.1 kg)-241 lb 13.5 oz (109.7  kg)] 239 lb 6.7 oz (108.6 kg) (10/24 0355)   Recent Labs Lab 03/10/14 1657 03/10/14 1941 03/11/14 0031 03/11/14 0343 03/11/14 0753  GLUCAP 118* 131* 102* 105* 118*    Recent Labs Lab 03/07/14 0530 03/08/14 0445 03/09/14 0450 03/10/14 0500 03/11/14 0345  NA 140 140 139 137 137  K 3.8 4.4 3.9 3.9 4.2  CL 96 97 94* 94* 95*  CO2 GLUCOSE 124* 118* 129* 115* 109*  BUN 80* 99* 45* 64* 38*  CREATININE 5.98* 7.92* 4.82* 7.17* 5.10*  CALCIUM 8.5 8.7 9.1 8.9 9.1  PHOS 6.0* 8.9* 5.9* 8.0* 4.9*    Recent Labs Lab 03/07/14 0530 03/08/14 0445 03/09/14 0450 03/10/14 0500 03/11/14 0345  ALBUMIN 2.1* 2.0* 2.3* 2.1* 2.4*    Recent Labs Lab 03/06/14 0430 03/07/14 0845 03/08/14 0445 03/11/14 0345  WBC 11.1* 9.0 10.1 13.1*  NEUTROABS 8.6*  --  7.2  --   HGB 7.4* 7.8* 7.6* 8.9*  HCT 23.8* 24.7* 24.4* 29.2*  MCV 90.8 90.5 91.0 93.9  PLT 267 263 284 379   No results found for this basename: CKTOTAL, CKMB, CKMBINDEX, TROPONINI,  in the last 168 hours No results found for this basename: LABPROT, INR,  in the last 72 hours No results found for this basename: COLORURINE, APPERANCEUR, LABSPEC, PHURINE, GLUCOSEU, HGBUR, BILIRUBINUR, KETONESUR, PROTEINUR, UROBILINOGEN, NITRITE, LEUKOCYTESUR,  in the last 72 hours     Component Value Date/Time   CHOL 177 02/28/2014 0350   Lab Results  Component Value Date   HGBA1C 5.8* 02/23/2014   No results found for this basename: labopia,  cocainscrnur,  labbenz,  amphetmu,  thcu,  labbarb    No results found for this basename: ETH,  in the last 168 hours  Ct Head Wo Contrast 02/28/14 - Stable 4.2 x 1.1 cm right lenticular nucleus hematoma with mild  surrounding vasogenic edema. Local mass effect upon the right  lateral ventricle with slight bowing of the septum to the left by  3.9 mm without significant change.  This may represent result of hypertensive hemorrhage and can be  assessed as this clears.  Prominent white  matter type changes asymmetric greater on the left  more conspicuous on the present exam than on the prior exam. The  patient may benefit from followup MR and MR angiogram for further  delineation. This may be ischemic in origin possibly related to  watershed type infarct. Other causes not excluded although felt to  be less likely.  02/24/2014 Stable appearance of the hematoma within the right basal ganglia. No new or progressive findings.  02/22/2014   1. No significant interval change in the patient's intraparenchymal hemorrhage at the right lateral basal ganglia, measuring 4.1 x 1.2 cm, with mild surrounding vasogenic edema. Mild associated mass effect, with 4 mm of leftward midline shift again seen. 2. Scattered small vessel ischemic microangiopathy. 3. Bilateral proptosis noted.     02/21/2014   1. Acute hemorrhage into the lateral right basal ganglia, likely indicating a hypertensive hemorrhage/hemorrhagic stroke. 2. Mild mass effect with shift of the midline to the left approximating 5 mm. Critical   US Renal Port 02/22/2014    1. Small echogenic kidneys consistent with chronic renal medical disease. No hydronephrosis. 2. The urinary bladder is decompressed by Foley catheter and cannot be evaluated.     Dg Chest Port 1 View 02/22/2014    1. Satisfactory positioning of endotracheal tube and right IJ line. 2. Cardiomegaly with persistent diffuse interstitial and airspace disease likely reflecting edema and congestive heart failure. 3. Bilateral pleural effusions and basilar airspace disease. This likely reflects atelectasis.    02/21/2014    Mild cardiac enlargement and possible asymmetric pulmonary edema. However, exam is limited by body habitus.     2D echo - - Left ventricle: The cavity size was normal. There was severe concentric hypertrophy. Systolic function was normal. The estimated ejection fraction was in the range of 60% to 65%. Wall motion was normal; there were no regional wall  motion abnormalities. - Left atrium: The atrium was mildly dilated. - Pulmonary arteries: PA peak pressure: 43 mm Hg (S). - Pericardium, extracardiac: A small, free-flowing pericardial effusion was identified posterior to the heart and circumferential to the heart. The fluid had no internal echoes.There was no evidence of hemodynamic compromise. Impressions: - The right ventricular systolic pressure was increased consistent with moderate pulmonary hypertension.  CUS - - Findings consistent with 1-39 percent stenosis involving the right internal carotid artery and the left internal carotid artery. - Right vertebral artery not imaged due to central line dressing. Left vertebral artery not imaged due to patient position.  DVT - - No obvious evidence of deep vein or superficial thrombosis involving the right lower extremity and left lower extremity. - No evidence of Baker&'s cyst on the right or left.  EEG 02/28/14 - This EEG is abnormal with severe generalized slowing of cerebral activity consistent with severe encephalopathic state. These findings are nonspecific and can be seen with metabolic  as well as toxic and degenerative encephalopathies. No evidence of an epileptic disorder was demonstrated. MRI Brain 03/08/2014 :1. Stable size and morphology of 4.5 x 1.4 x 2.8 cm subacute  intraparenchymal hematoma involving the right lentiform nucleus with  associated vasogenic edema and mass effect on the right lateral  ventricle. There is persistent 4 mm of right-to-left midline shift.  2. Acute left MCA territory infarct involving the deep white matter  of the left frontoparietal lobes. This is largely watershed in  distribution.  3. Additional small infarcts within the deep white matter of the  right frontal lobe, right parieto-occipital region, and left pons.  MRA HEAD :  1. Multi focal irregularity with associated stenoses within the  supra clinoid left ICA, left M1 segment, and  possibly left A1  segment as above. Possible vasculitis or drug exposure could be  considered  A1C 5.8 and LDL 92  PHYSICAL EXAM Young obese Caucasian lady not in distress. On trach collar  . Afebrile. Head is nontraumatic. Neck is supple without bruit. Cardiac exam soft ejection murmur RRR .no gallop. Lungs bilateral crackles and  rhonchi to auscultation. Distal pulses are well felt.   Neurological Exam: on trach collar now, eyes open on voice but not tracking and not following commands.Aphasic Eyes in the middle. Pupils 3 mm reactive. Fundi not visualized. Left lower face weakness, corneal and gag present. On pain stimulation, she had trace withdraw to pain on RUE and RLE and LUE and more on LLE. Left Plantar upgoing and right down going.  ASSESSMENT/PLAN Ms. Lynn Gentry is a 43 y.o. female with no documented medical history found with new onset slurred speech and left-sided weakness. CT showed right external capsule hemorrhage. However, her condition getting worse with pulmonary edema, AKI requiring HD, respiratory failure needing ventilation. Transferred pt to CCM for better management.  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, nondominant side with cytotoxic edema, mild transfalcine herniation, pulmonary edema, respiratory and renal failure  MRI showed  Moderate size left MCA and small subcortical and Pontine infarcts with MRA showing multifocal narrowing raising possibility of CNS vasculitis. Peripheral markers for systemic vasculitis and negative. Due to her general neurological and medical condition she is not a candidate at the present time for brain biopsy for definitive diagnosis of CNS vasculitis.Plan to check urine drug screen but patient is anuric hence unable to do so.  EEG showed severe slowing but no seizure.  2D echo unremarkable  Carotid doppler unremarkable  HgbA1c 5.8  Heparin subq for VTE prophylaxis  Resultant VDRF, aphasia  dense right hemiplegia with spasticity and mild left hemiparesis  no antithrombotics prior to admission, currently not on antithrombotics due to hemorrhage.  Ongoing aggressive risk factor management     Acute Respiratory Failure with severe acute pulmonary edema  trach on 03/02/14  CCM following  Diuresing, IV Lasix, Hemodialysis, urine output minimal today  CXR series  Malignant Hypertension  BP 226/111 at onset  Home meds:   none  SBP goal < 150 due to presence of end organ damage  On amlodipine, clonidine patch and metoprolol and hydralazine  off cardene   Acute renal failure with volume overload, suspect baseline chronic kidney disease  Diuresis and management per critical care, on HD  Continue to trend Cre.  HD as per nephrology  Other Stroke Risk Factors ETOH use   Morbid Obesity, Body mass index is 41.08 kg/(m^2).   Hospital day # 18 03/11/2014 12:18 PM   D/w  Dr   with husband, mother and other family members regarding her poor prognosis,  Patient is not a candidate for brain biopsy and catheter angiogram would be risky given her renal failure. The family understands. They will  think about doing the catheter angiogram for the next couple of days and if they wish this can be arranged early next week. I have discussed the risk of  Iv contrast and her renal function with nephrologist. Who state she may have angiogram if necessary. Patient may need high-dose steroids if a catheter angiogram suggests CNS vasculitis. Delia Heady, MD Stroke Neurology 03/11/2014 12:18 PM   To contact Stroke Continuity provider, please refer to [email or web address]. After hours, contact General Neurology

## 2014-03-11 NOTE — Progress Notes (Signed)
Attempted to wean pt on CPAP/PS with increased respirations in the 40s even with increased PS up to 14 and spont. Minute volume 18-19.  Pt suctioned for thick old bloody secretions.  VSS. Pt left on full vent support at this time, will attempt to wean again.

## 2014-03-12 LAB — GLUCOSE, CAPILLARY
GLUCOSE-CAPILLARY: 120 mg/dL — AB (ref 70–99)
GLUCOSE-CAPILLARY: 120 mg/dL — AB (ref 70–99)
GLUCOSE-CAPILLARY: 123 mg/dL — AB (ref 70–99)
GLUCOSE-CAPILLARY: 98 mg/dL (ref 70–99)
Glucose-Capillary: 101 mg/dL — ABNORMAL HIGH (ref 70–99)
Glucose-Capillary: 120 mg/dL — ABNORMAL HIGH (ref 70–99)
Glucose-Capillary: 138 mg/dL — ABNORMAL HIGH (ref 70–99)

## 2014-03-12 LAB — CBC
HCT: 24.8 % — ABNORMAL LOW (ref 36.0–46.0)
HEMOGLOBIN: 7.6 g/dL — AB (ref 12.0–15.0)
MCH: 28 pg (ref 26.0–34.0)
MCHC: 30.6 g/dL (ref 30.0–36.0)
MCV: 91.5 fL (ref 78.0–100.0)
PLATELETS: 342 10*3/uL (ref 150–400)
RBC: 2.71 MIL/uL — ABNORMAL LOW (ref 3.87–5.11)
RDW: 18.9 % — ABNORMAL HIGH (ref 11.5–15.5)
WBC: 8.9 10*3/uL (ref 4.0–10.5)

## 2014-03-12 LAB — RENAL FUNCTION PANEL
ALBUMIN: 2.4 g/dL — AB (ref 3.5–5.2)
ANION GAP: 18 — AB (ref 5–15)
BUN: 55 mg/dL — ABNORMAL HIGH (ref 6–23)
CALCIUM: 9.1 mg/dL (ref 8.4–10.5)
CO2: 27 mEq/L (ref 19–32)
Chloride: 94 mEq/L — ABNORMAL LOW (ref 96–112)
Creatinine, Ser: 7.27 mg/dL — ABNORMAL HIGH (ref 0.50–1.10)
GFR calc non Af Amer: 6 mL/min — ABNORMAL LOW (ref >90)
GFR, EST AFRICAN AMERICAN: 7 mL/min — AB (ref >90)
Glucose, Bld: 113 mg/dL — ABNORMAL HIGH (ref 70–99)
PHOSPHORUS: 6.1 mg/dL — AB (ref 2.3–4.6)
POTASSIUM: 3.8 meq/L (ref 3.7–5.3)
SODIUM: 139 meq/L (ref 137–147)

## 2014-03-12 NOTE — Consult Note (Signed)
Palliative Medicine Team Consult Note  43 yo woman from [place] previously healthy per family, described as an active and loving mom to their 43 yo son and worked as an Nurse, learning disability for [employer] was admitted on 10/6 with ICH/Basal Ganglia hemorrhage. She is currently trach/PEG and intermittently vent dependent-prognosis is extremely poor- no purposeful movements but does spontaneously move all of her extremities.  Met with patient's husband, sister and her mother to discuss goals of care.   1. Assessment of their understanding of her condition: Very limited understanding and expectations are not realistic medically-  Husband: "I know she will have a full recovery" (he also tells their son this), mother and sister also perceive from providers that she is improving. No insight into trajectory of Ellarose's illness-ie.quadraplegia, recurrent infections, wounds and financial burdens.   2. Goals: Shyloh had never talked about what her wishes would be if something like this happened-catastrophic illness, etc.. They have no information to provide on what they think her wishes might be in this situation. They are unable to talk about this and say they need to "see what happens" before thinking about anything other than full recovery.   I provided supportive listening and talked with them about the medical reality of her situation- mostly tried to established rapport and trust.  I am certain they will want any aggressive medical intervention available and desire to continue full medical support.   Recommend LTAC. If facility has palliative services available this family will need continued holistic support.-they are currently in shock and denial that this has occurred and not in a place of full understanding about prognosis- they are not ready to make any decisions today.  Time: 10AM-11AM 50 minutes. Greater than 50%  of this time was spent counseling and coordinating care  related to the above assessment and plan.    Lane Hacker, DO Palliative Medicine [phone]

## 2014-03-12 NOTE — Progress Notes (Signed)
PULMONARY / CRITICAL CARE MEDICINE   Name: Lynn Gentry MRN: [id number] DOB: [birth year]    ADMISSION DATE:  02/21/2014 CONSULTATION DATE:  03/12/2014  REFERRING MD :  Pearlean Brownie  CHIEF COMPLAINT:  Respiratory failure in setting of ICH  INITIAL PRESENTATION: 43 y.o. F brought to [facility] ED on 10/6 with left sided weakness, slurred speech, inability to stand up.  In ED, found to have acute lateral right basal ganglia ICH with mild mass effect and 46mm MLS.  Admitted to neuro ICU and developed acute respiratory failure, failed bipap and PCCM called for intubation and management. She was admitted to neuro ICU and later that evening had increasing O2 demands.  She had no improvement in SpO2, work of breathing, and mental status despite 3 hours of BiPAP.  PCCM was consulted for intubation.  STUDIES:  10/06 CT head:  acute hemorrhage into the lateral right basal ganglia, likely indicating a hypertensive hemorrhage/hemorrhagic stroke.  Mild mass effect with 14mm MLS to the left. 10/07 Renal US:  echogenic kidneys s/w ckd, no hydro.  10/07 CT head: [facility] 10/07 repeat CT head: [facility] 10/07 Doppler carotid: consistent with 1-39% stenosis involving the R internal carotid art and the L internal carotid art 10/07 TTE: LVEF 60-65%. Severe concentric LVH. LA dilatation. PASP est 43 mmHg 10/09 CT head: [facility] 10/09 LE venous Dopplers (ordered by Stroke) >> negative 10/21 MRI / MRA brain >> stable subacute intraparenchymal hemorrhage, localized vasogenic edema, mild mass effect, 4 mm R to L shift, acute L MCA infarct, largely watershed in distribution, additional small infarcts in deep white matter   SIGNIFICANT EVENTS: 10/06  Admitted for ICH, developed respiratory failure requiring intubation. 10/07  Persistent hypertension. Remained on nicardipine gtt. Persistent volume overload snd pulm edema pattern on CXR. Lasix gtt resulted in minimal increase in Uo 10/08  Worsening renal function, oliguria. Renal consult. HD  performed 10/09  Acute change in neuro status. STAT CT head ordered: [facility] 10/09  Further HD planned 10/14  HD with negative fluid balance 10/15  Trach (JY) 10/16  Febrile and purulent matter from nasal cavity and trach. 10/17  Transition from fentanyl gtt to Duragesic patch. Begin PSV weaning 10/18  Transfer to vent SDU bed ordered. Tolerating PSV 10 cm H2O 10/19  Transfer to SDU/2900 10/21  Off vent 36hours, but went back on in evening for secretions/tachypnea; Persistent diarrhea, d/c'd senokot, Family conversation from [place] explaining dismal prognosis 10/22  Mucus plugging this AM; Neurology had discussion with family explaining dismal prognosis, family desires full code 10/23- active pall care discussions 10/25- pall care meeting not much progress  SUBJ -no changes   VITAL SIGNS: Temp:  [98.4 F (36.9 C)-99.3 F (37.4 C)] 98.5 F (36.9 C) (10/25 0737) Pulse Rate:  [53-90] 86 (10/25 0840) Resp:  [12-30] 30 (10/25 0840) BP: (93-146)/(47-87) 93/47 mmHg (10/25 0840) SpO2:  [99 %-100 %] 99 % (10/25 0840) FiO2 (%):  [40 %] 40 % (10/25 0840) Weight:  [109.1 kg (240 lb 8.4 oz)] 109.1 kg (240 lb 8.4 oz) (10/25 0401)  VENTILATOR SETTINGS: Vent Mode:  [-] CPAP;PSV FiO2 (%):  [40 %] 40 % Set Rate:  [15 bmp] 15 bmp Vt Set:  [500 mL] 500 mL PEEP:  [5 cmH20] 5 cmH20 Pressure Support:  [10 cmH20] 10 cmH20 Plateau Pressure:  [23 cmH20-24 cmH20] 23 cmH20  INTAKE / OUTPUT: Intake/Output     10/24 0701 - 10/25 0700 10/25 0701 - 10/26 0700   I.V. (mL/kg) 230 (2.1)    Other  120    NG/GT 920    IV Piggyback     Total Intake(mL/kg) 1270 (11.6)    Other     Stool     Total Output       Net +1270           PHYSICAL EXAMINATION:  Gen: only opens eyes to voice that's it, no other responses HEENT: Trach site clean PULM: ronchi mild diffuse CV: distant tones RRR, no mgr AB: BS+, soft, nontender, PEG Ext: 2 plus  edema Neuro: opens eyes to voice, no movement of extremities on my  exam, no changes  LABS:  I have reviewed all of today's lab results. Relevant abnormalities are discussed in the A/P section  10/22 CXR > stable bilateral airspace disease  ASSESSMENT / PLAN:  PULMONARY ETT 10/6 >> 10/15 Trach tube 10/15 >>  A: Acute hypoxemic respiratory failure > nearly completely off vent support HCAP> MSSA Mucus plugging 10/22 Edema component P:   guaifenesin Chest PT as able Trach collar on hold Attempt with PS CPAP 10/5 , pos pressure x 12 hrs as goal Likely in am TC re attempts, pcxr review prior Cont vent bundle Neg balance on HD needed, due monday  CARDIOVASCULAR CVL R IJ 10/6 >>  A:  HTN persistent Diastolic CHF > currently mild volume overload P:  Unable to remove cvl Continue hydralazine, clonidine, metoprolol, norvasc, enalapril Follow BP after HD in am  PRN hydralazine to maintain SBP < 170 mmHg PRN metoprolol to maintain HR < 115/min  RENAL L IJ HD cath 10/08 >> 10/20 Perm Cath L [place] 10/20 >> A:   Acute renal failure> now ESRD  P:   kvo HD in am   GASTROINTESTINAL A:   Obesity P:   SUP: Pantoprazole Cont TFs tolerated well  HEMATOLOGIC A:   ICU acquired anemia P:  DVT px: SQ heparin Monitor CBC intermittently and after neg balance hemoconcetration Transfuse per usual ICU guidelines  INFECTIOUS A:   HCAP > MSSA P:   Resp 10/17 >> Abundant staph aureus MSSA Blood 10/16 >> ng C.diff 10/20 >> neg  Fluconazole 10/15 >> 10/17 Vancomycin 10/16 >> plan 7 days Zosyn 10/16 >> 10/20 Ancef 10/21 >> MWF with HD plan   Establish stop date ancef, consider 10 days  ENDOCRINE A:   Hyperglycemia, controlled P:   CBG's q4hr Cont SSI  NEUROLOGIC A:   Hypertensive R basal Ganglion Hemorrhage  Acute L MCA stroke Effective quadriplegic at this point Acute Encephalopathy due to strokes > overall prognosis is grim with little chance of meaningful neurologic recovery P:   Mgmt per Stroke team Cont PRN fentanyl  RASS  goal: 0 D/w pall care, family NOT realistic at all  FAMILY: I updated family at length on 10/22 over poor prognosis and Neurology did the same on 10/23.   TODAY'S SUMMARY: goal 12 hrs cpap 5 ps 5, goal in am back to TRAch collar, pcxr in am for atx prior to this[facility]  Mcarthur Rossettianiel J. Tyson AliasFeinstein, MD, FACP Pgr: [phone] [facility]

## 2014-03-12 NOTE — Progress Notes (Signed)
STROKE TEAM PROGRESS NOTE   HISTORY Lynn Gentry is an 43 y.o. female with no previously documented medical disorder who was brought to [facility] with new onset slurred speech and left-sided weakness. Patient went to work as usual until 1500. She was found by her son at home at 1600 unable to get up out of a chair with left sided weakness, slurred speech. Patient was last known well around 3 PM this afternoon 02/21/2014. CT scan of her head showed an acute 4.1 x 1.5 cm right basal ganglia hemorrhage with mild mass effect and right to left shift of 5 mm. Patient's blood pressure was markedly elevated at 226/111. She was started on Cardene drip and blood pressure responded well. She has shown continual respiratory difficulty. She was initially given oxygen by nasal cannula followed by NBR, and subsequently placed on BiPAP. She continued to have oxygen saturations only in the high 80s to low 90s on 100% O2, with a respiratory rates in the 40s. ABG showed pH of 2.82, PCO2 37 PO2 of 57. Chest x-ray showed mild cardiomegaly with possible asymmetric pulmonary edema. BNP was 36,455. Because of deteriorating respiratory status intubation and mechanical ventilation was elected. CCM service was consulted. Patient was not administered TPA secondary to ICH. She was admitted to the neuro ICU  for further evaluation and treatment.   SUBJECTIVE (INTERVAL HISTORY) Pt remains responsive today, open eyes  to voice although not tracking and not following any commands. Spontaneously moving LUE and Withdraw to LLE on pain stimulation. Not moving RUE and trace withdraw to RLE. Still on vent with trach. Pt mom and husband at bedside, discussed with them in length about cerebral angiogram for rule out vasculiits, complications and prognosis, they would like to proceed with the procedure. Will get IR involved in am.  OBJECTIVE Temp:  [98.4 F (36.9 C)-99 F (37.2 C)] 98.6 F (37 C) (10/25 1202) Pulse Rate:   [53-95] 95 (10/25 1220) Cardiac Rhythm:  [-] Normal sinus rhythm (10/25 0800) Resp:  [12-38] 34 (10/25 1220) BP: (93-170)/(47-87) 170/74 mmHg (10/25 1202) SpO2:  [99 %-100 %] 100 % (10/25 1220) FiO2 (%):  [40 %] 40 % (10/25 1220) Weight:  [240 lb 8.4 oz (109.1 kg)] 240 lb 8.4 oz (109.1 kg) (10/25 0401)   Recent Labs Lab 03/11/14 2009 03/11/14 2347 03/12/14 0334 03/12/14 0739 03/12/14 1205  GLUCAP 131* 101* 120* 98 138*    Recent Labs Lab 03/08/14 0445 03/09/14 0450 03/10/14 0500 03/11/14 0345 03/12/14 0330  NA 140 139 137 137 139  K 4.4 3.9 3.9 4.2 3.8  CL 97 94* 94* 95* 94*  CO2 25 27 28 26 27   GLUCOSE 118* 129* 115* 109* 113*  BUN 99* 45* 64* 38* 55*  CREATININE 7.92* 4.82* 7.17* 5.10* 7.27*  CALCIUM 8.7 9.1 8.9 9.1 9.1  PHOS 8.9* 5.9* 8.0* 4.9* 6.1*    Recent Labs Lab 03/08/14 0445 03/09/14 0450 03/10/14 0500 03/11/14 0345 03/12/14 0330  ALBUMIN 2.0* 2.3* 2.1* 2.4* 2.4*    Recent Labs Lab 03/06/14 0430 03/07/14 0845 03/08/14 0445 03/11/14 0345 03/12/14 0735  WBC 11.1* 9.0 10.1 13.1* 8.9  NEUTROABS 8.6*  --  7.2  --   --   HGB 7.4* 7.8* 7.6* 8.9* 7.6*  HCT 23.8* 24.7* 24.4* 29.2* 24.8*  MCV 90.8 90.5 91.0 93.9 91.5  PLT 267 263 284 379 342   No results found for this basename: CKTOTAL, CKMB, CKMBINDEX, TROPONINI,  in the last 168 hours No results found for  this basename: LABPROT, INR,  in the last 72 hours No results found for this basename: COLORURINE, APPERANCEUR, LABSPEC, PHURINE, GLUCOSEU, HGBUR, BILIRUBINUR, KETONESUR, PROTEINUR, UROBILINOGEN, NITRITE, LEUKOCYTESUR,  in the last 72 hours     Component Value Date/Time   CHOL 177 02/28/2014 0350   Lab Results  Component Value Date   HGBA1C 5.8* 02/23/2014   No results found for this basename: labopia,  cocainscrnur,  labbenz,  amphetmu,  thcu,  labbarb    No results found for this basename: ETH,  in the last 168 hours  I have personally reviewed the radiological images below and agree  with the radiology interpretations.  Ct Head Wo Contrast 02/28/14 - Stable 4.2 x 1.1 cm right lenticular nucleus hematoma with mild  surrounding vasogenic edema. Local mass effect upon the right  lateral ventricle with slight bowing of the septum to the left by  3.9 mm without significant change.  This may represent result of hypertensive hemorrhage and can be  assessed as this clears.  Prominent white matter type changes asymmetric greater on the left  more conspicuous on the present exam than on the prior exam. The  patient may benefit from followup MR and MR angiogram for further  delineation. This may be ischemic in origin possibly related to  watershed type infarct. Other causes not excluded although felt to  be less likely.  02/24/2014 Stable appearance of the hematoma within the right basal ganglia. No new or progressive findings.  02/22/2014   1. No significant interval change in the patient's intraparenchymal hemorrhage at the right lateral basal ganglia, measuring 4.1 x 1.2 cm, with mild surrounding vasogenic edema. Mild associated mass effect, with 4 mm of leftward midline shift again seen. 2. Scattered small vessel ischemic microangiopathy. 3. Bilateral proptosis noted.     02/21/2014   1. Acute hemorrhage into the lateral right basal ganglia, likely indicating a hypertensive hemorrhage/hemorrhagic stroke. 2. Mild mass effect with shift of the midline to the left approximating 5 mm. Critical   US Renal Port 02/22/2014    1. Small echogenic kidneys consistent with chronic renal medical disease. No hydronephrosis. 2. The urinary bladder is decompressed by Foley catheter and cannot be evaluated.     Dg Chest Port 1 View 02/22/2014    1. Satisfactory positioning of endotracheal tube and right IJ line. 2. Cardiomegaly with persistent diffuse interstitial and airspace disease likely reflecting edema and congestive heart failure. 3. Bilateral pleural effusions and basilar airspace disease.  This likely reflects atelectasis.    02/21/2014    Mild cardiac enlargement and possible asymmetric pulmonary edema. However, exam is limited by body habitus.     2D echo - - Left ventricle: The cavity size was normal. There was severe concentric hypertrophy. Systolic function was normal. The estimated ejection fraction was in the range of 60% to 65%. Wall motion was normal; there were no regional wall motion abnormalities. - Left atrium: The atrium was mildly dilated. - Pulmonary arteries: PA peak pressure: 43 mm Hg (S). - Pericardium, extracardiac: A small, free-flowing pericardial effusion was identified posterior to the heart and circumferential to the heart. The fluid had no internal echoes.There was no evidence of hemodynamic compromise. Impressions: - The right ventricular systolic pressure was increased consistent with moderate pulmonary hypertension.  CUS - - Findings consistent with 1-39 percent stenosis involving the right internal carotid artery and the left internal carotid artery. - Right vertebral artery not imaged due to central line dressing. Left vertebral artery not imaged  due to patient position.  DVT - - No obvious evidence of deep vein or superficial thrombosis involving the right lower extremity and left lower extremity. - No evidence of Baker&'s cyst on the right or left.  EEG 02/28/14 - This EEG is abnormal with severe generalized slowing of cerebral activity consistent with severe encephalopathic state. These findings are nonspecific and can be seen with metabolic as well as toxic and degenerative encephalopathies. No evidence of an epileptic disorder was demonstrated.  MRI Brain 03/08/2014 1. Stable size and morphology of 4.5 x 1.4 x 2.8 cm subacute  intraparenchymal hematoma involving the right lentiform nucleus with  associated vasogenic edema and mass effect on the right lateral  ventricle. There is persistent 4 mm of right-to-left midline shift.  2.  Acute left MCA territory infarct involving the deep white matter  of the left frontoparietal lobes. This is largely watershed in  distribution.  3. Additional small infarcts within the deep white matter of the  right frontal lobe, right parieto-occipital region, and left pons.   MRA HEAD 03/08/14 1. Multi focal irregularity with associated stenoses within the  supra clinoid left ICA, left M1 segment, and possibly left A1  segment as above. Possible vasculitis or drug exposure could be  considered  A1C 5.8 and LDL 92  PHYSICAL EXAM Young obese Caucasian lady not in distress. On trach. Afebrile. Head is nontraumatic. Neck is supple without bruit. Cardiac exam soft ejection murmur RRR .no gallop. Lungs bilateral crackles and  rhonchi to auscultation. Distal pulses are well felt.   Neurological Exam: still on ventilation with trach, eyes open on voice but not tracking and not following commands.Aphasic Eyes in the right side gaze position. Pupils 3 mm reactive. Fundi not visualized. Left lower face weakness, corneal and gag present. Spontaneously moving LUE and Withdraw to LLE on pain stimulation. Not moving RUE and trace withdraw to RLE. RUE spastic on the flexed position and RLE also increased muscle tone. Left Plantar upgoing and right down going.  ASSESSMENT/PLAN Lynn Gentry is a 43 y.o. female with no documented medical history found with new onset slurred speech and left-sided weakness. CT showed right external capsule hemorrhage. However, her condition getting worse with pulmonary edema, AKI requiring HD, respiratory failure needing ventilation. Transferred pt to CCM for better management. Days after admission, pt was found to not moving on the right, CT at that time no change but later repeat CT showed left subcortical hypodensity. MRI not able to perform initially due to not able to lie flat. But recently done showed extensive left subcortical stroke as well as punctate right  frontal deep WM and right parietoccipital region as well as left pons.   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 Will complete some peripheral markers for systemic vasculitis, so far negative. Due to her general neurological and medical condition she is not a candidate at the present time for brain biopsy for definitive diagnosis of CNS vasculitis.Plan to check urine drug screen but patient is anuric hence unable to do so.  Discussed with family  regarding cerebral angiogram to look at distal vessels, family agree with proceed with procedure. Will have IR involved in am.  EEG showed severe slowing but no seizure.  2D echo unremarkable  Carotid doppler unremarkable  HgbA1c 5.8  Heparin subq for VTE prophylaxis  Resultant VDRF, aphasia dense right hemiplegia with spasticity and mild left hemiparesis  no antithrombotics prior to admission, currently not on antithrombotics due to hemorrhage.  Ongoing aggressive risk factor management  Acute Respiratory Failure with severe acute pulmonary edema  trach on 03/02/14  CCM following  Diuresing, IV Lasix, Hemodialysis, urine output minimal today  CXR series  Malignant Hypertension  BP 226/111 at onset  Home meds:   none  SBP goal < 150 due to presence of end organ damage  On amlodipine, clonidine patch and metoprolol and hydralazine and enalapril  off cardene   BP stabilized  Acute renal failure with volume overload, suspect baseline chronic  kidney disease  Diuresis and management per critical care, on HD  Continue to trend Cre.  HD as per nephrology  Other Stroke Risk Factors ETOH use   Morbid Obesity, Body mass index is 41.27 kg/(m^2).   Hospital day # 19 03/12/2014 2:47 PM  Had long discussion with husband and mother regarding her diagnosis, treatment and poor prognosis. Patient is not a candidate for brain biopsy at this time. However, they both agreed to proceed with cerebral angiogram to rule out vasculitis. This will be arranged with interventional radiology in am. Patient may need high-dose steroids if a catheter angiogram suggests CNS vasculitis. Family expressed understanding.  Marvel Plan, MD PhD Stroke Neurology 03/12/2014 3:17 PM   To contact Stroke Continuity provider, please refer to [email or web address]. After hours, contact General Neurology

## 2014-03-12 NOTE — Progress Notes (Signed)
Percussor CPT attempted and heart rate jumped to 141.  RT discontinued treatment.

## 2014-03-12 NOTE — Progress Notes (Signed)
Patient ID: Lynn Gentry, female    DOB: June 03, 1970, 43 y.o.   MRN: [id number] Nephrology Progress Note  Assessment/Plan: 43 y.o. female with no significant known PMH admitted for right basal ganglia hemorrhage, underwent intubation for declining respiratory status and oliguric, started on HD.   1. AKI vs. Subacute vs. AKI/CKD- oliguric requiring dialysis. S/p HD treatments with marked improvement of volume and BP  1. Cont with HD MWF. 2. Remains oliguric and dialysis-dependent  3. Renal U/S suggests she is end-stage; ok to do the catheter angiogram to eval for CNS vasculitis if deemed appropriate by neuro.  4. No changes in plan today. 2. Vascular access: TDC placed left IJ 10/20. 3. 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 Elevated phos. 5. Neuro- MRI 10/21 with stable hematoma lentiform nucleas, acute left MCA infarct, small infarcts right frontal, parieto-occiptal, left pons 6. VDRF- per PCCM  7. Malignant HTN and volume overload- improving with aggressive HD and UF.   Renal Attending: We continue to support with dialysis and she remains dialysis dependent and we believe ESRD.  HD MWF.  If aggressive care pursued, then angio not contraindicated from renal perspective. BP improved. [name] C   _________________________________________________________________ S: Spontaneous eye opening, moves both UE, left LE.  O:BP 112/59  Pulse 59  Temp(Src) 98.5 F (36.9 C) (Oral)  Resp 14  Ht 5\' 4"  (1.626 m)  Wt 240 lb 8.4 oz (109.1 kg)  BMI 41.27 kg/m2  SpO2 100%  LMP 03/01/2014  Intake/Output Summary (Last 24 hours) at 03/12/14 0747 Last data filed at 03/12/14 0700  Gross per 24 hour  Intake   1270 ml  Output      0 ml  Net   1270 ml   Intake/Output: I/O last 3 completed shifts: In: 2150 [I.V.:350; Other:360; NG/GT:1440] Out: -   Intake/Output this shift:    Weight change: -1 lb 5.2 oz (-0.6 kg) Gen: NAD HEENT: dilated pupils bilaterally, minimally reactive to  light CVS: RRR, no mrg Resp: transmitted upper airway sounds Abd: soft, obese, +BS Ext: 1+ pedal edema Neuro: opens eyes to tactile and verbal stimuli. Raises left hand, moved left foot   Recent Labs Lab 03/06/14 0430 03/07/14 0530 03/08/14 0445 03/09/14 0450 03/10/14 0500 03/11/14 0345 03/12/14 0330  NA 139 140 140 139 137 137 139  K 4.8 3.8 4.4 3.9 3.9 4.2 3.8  CL 93* 96 97 94* 94* 95* 94*  CO2 21 25 25 27 28 26 27   GLUCOSE 113* 124* 118* 129* 115* 109* 113*  BUN 152* 80* 99* 45* 64* 38* 55*  CREATININE 9.14* 5.98* 7.92* 4.82* 7.17* 5.10* 7.27*  ALBUMIN 2.0* 2.1* 2.0* 2.3* 2.1* 2.4* 2.4*  CALCIUM 8.6 8.5 8.7 9.1 8.9 9.1 9.1  PHOS 10.8* 6.0* 8.9* 5.9* 8.0* 4.9* 6.1*   Liver Function Tests:  Recent Labs Lab 03/10/14 0500 03/11/14 0345 03/12/14 0330  ALBUMIN 2.1* 2.4* 2.4*   No results found for this basename: LIPASE, AMYLASE,  in the last 168 hours No results found for this basename: AMMONIA,  in the last 168 hours CBC:  Recent Labs Lab 03/06/14 0430 03/07/14 0845 03/08/14 0445 03/11/14 0345  WBC 11.1* 9.0 10.1 13.1*  NEUTROABS 8.6*  --  7.2  --   HGB 7.4* 7.8* 7.6* 8.9*  HCT 23.8* 24.7* 24.4* 29.2*  MCV 90.8 90.5 91.0 93.9  PLT 267 263 284 379   Cardiac Enzymes: No results found for this basename: CKTOTAL, CKMB, CKMBINDEX, TROPONINI,  in the last 168 hours CBG:  Recent Labs Lab 03/11/14 1133 03/11/14 1652 03/11/14 2009 03/11/14 2347 03/12/14 0334  GLUCAP 136* 119* 131* 101* 120*    Iron Studies: No results found for this basename: IRON, TIBC, TRANSFERRIN, FERRITIN,  in the last 72 hours Studies/Results: No results found. Marland Kitchen amLODipine  10 mg Per Tube Daily  . antiseptic oral rinse  7 mL Mouth Rinse QID  .  ceFAZolin (ANCEF) IV  2 g Intravenous Q M,W,F-1800  . chlorhexidine  15 mL Mouth Rinse BID  . cloNIDine  0.3 mg Transdermal Q Mon  . darbepoetin (ARANESP) injection - DIALYSIS  100 mcg Intravenous Q Mon-HD  . enalapril  5 mg Per Tube  Daily  . feeding supplement (NEPRO CARB STEADY)  1,000 mL Per Tube Q24H  . guaiFENesin  10 mL Per Tube TID  . heparin subcutaneous  5,000 Units Subcutaneous 3 times per day  . hydrALAZINE  100 mg Per Tube 4 times per day  . insulin aspart  0-15 Units Subcutaneous 6 times per day  . metoprolol tartrate  50 mg Per Tube BID  . multivitamin  5 mL Per Tube Daily  . pantoprazole sodium  40 mg Per Tube Q1200  . scopolamine  1 patch Transdermal Q72H    BMET    Component Value Date/Time   NA 139 03/12/2014 0330   K 3.8 03/12/2014 0330   CL 94* 03/12/2014 0330   CO2 27 03/12/2014 0330   GLUCOSE 113* 03/12/2014 0330   BUN 55* 03/12/2014 0330   CREATININE 7.27* 03/12/2014 0330   CALCIUM 9.1 03/12/2014 0330   GFRNONAA 6* 03/12/2014 0330   GFRAA 7* 03/12/2014 0330   CBC    Component Value Date/Time   WBC 13.1* 03/11/2014 0345   RBC 3.11* 03/11/2014 0345   HGB 8.9* 03/11/2014 0345   HCT 29.2* 03/11/2014 0345   PLT 379 03/11/2014 0345   MCV 93.9 03/11/2014 0345   MCH 28.6 03/11/2014 0345   MCHC 30.5 03/11/2014 0345   RDW 18.9* 03/11/2014 0345   LYMPHSABS 1.3 03/08/2014 0445   MONOABS 1.3* 03/08/2014 0445   EOSABS 0.2 03/08/2014 0445   BASOSABS 0.1 03/08/2014 0445    [name] 7:47 AM

## 2014-03-13 ENCOUNTER — Inpatient Hospital Stay (HOSPITAL_COMMUNITY): Payer: 59

## 2014-03-13 LAB — GLUCOSE, CAPILLARY
Glucose-Capillary: 116 mg/dL — ABNORMAL HIGH (ref 70–99)
Glucose-Capillary: 129 mg/dL — ABNORMAL HIGH (ref 70–99)
Glucose-Capillary: 162 mg/dL — ABNORMAL HIGH (ref 70–99)
Glucose-Capillary: 118 mg/dL — ABNORMAL HIGH (ref 70–99)
Glucose-Capillary: 134 mg/dL — ABNORMAL HIGH (ref 70–99)

## 2014-03-13 LAB — ALBUMIN: Albumin: 2.5 g/dL — ABNORMAL LOW (ref 3.5–5.2)

## 2014-03-13 LAB — HIV ANTIBODY (ROUTINE TESTING W REFLEX): HIV 1&2 Ab, 4th Generation: NONREACTIVE

## 2014-03-13 LAB — BASIC METABOLIC PANEL
Anion gap: 18 — ABNORMAL HIGH (ref 5–15)
BUN: 72 mg/dL — AB (ref 6–23)
CALCIUM: 9.3 mg/dL (ref 8.4–10.5)
CHLORIDE: 95 meq/L — AB (ref 96–112)
CO2: 26 meq/L (ref 19–32)
CREATININE: 8.63 mg/dL — AB (ref 0.50–1.10)
GFR calc Af Amer: 6 mL/min — ABNORMAL LOW (ref >90)
GFR calc non Af Amer: 5 mL/min — ABNORMAL LOW (ref >90)
Glucose, Bld: 127 mg/dL — ABNORMAL HIGH (ref 70–99)
Potassium: 4.1 mEq/L (ref 3.7–5.3)
Sodium: 139 mEq/L (ref 137–147)

## 2014-03-13 LAB — CBC
HEMATOCRIT: 27.9 % — AB (ref 36.0–46.0)
Hemoglobin: 8.6 g/dL — ABNORMAL LOW (ref 12.0–15.0)
MCH: 28.1 pg (ref 26.0–34.0)
MCHC: 30.8 g/dL (ref 30.0–36.0)
MCV: 91.2 fL (ref 78.0–100.0)
PLATELETS: 401 10*3/uL — AB (ref 150–400)
RBC: 3.06 MIL/uL — ABNORMAL LOW (ref 3.87–5.11)
RDW: 19.1 % — ABNORMAL HIGH (ref 11.5–15.5)
WBC: 9.4 10*3/uL (ref 4.0–10.5)

## 2014-03-13 LAB — RPR

## 2014-03-13 LAB — T4, FREE: Free T4: 1.29 ng/dL (ref 0.80–1.80)

## 2014-03-13 LAB — FOLATE: Folate: 15.7 ng/mL

## 2014-03-13 LAB — TSH: TSH: 4.82 u[IU]/mL — ABNORMAL HIGH (ref 0.350–4.500)

## 2014-03-13 LAB — PHOSPHORUS: Phosphorus: 7 mg/dL — ABNORMAL HIGH (ref 2.3–4.6)

## 2014-03-13 LAB — RHEUMATOID FACTOR: Rhuematoid fact SerPl-aCnc: <10 IU/mL (ref <=14)

## 2014-03-13 LAB — VITAMIN B12: Vitamin B-12: 825 pg/mL (ref 211–911)

## 2014-03-13 MED ORDER — NEPRO/CARBSTEADY PO LIQD
1000.0000 mL | ORAL | Status: DC
Start: 2014-03-13 — End: 2014-03-20
  Administered 2014-03-14 – 2014-03-19 (×5): 1000 mL
  Filled 2014-03-13 (×10): qty 1000

## 2014-03-13 MED ORDER — ALTEPLASE 2 MG IJ SOLR
2.0000 mg | Freq: Once | INTRAMUSCULAR | Status: AC | PRN
  Filled 2014-03-13: qty 2

## 2014-03-13 MED ORDER — PENTAFLUOROPROP-TETRAFLUOROETH EX AERO: 1.0000 "application " | INHALATION_SPRAY | CUTANEOUS | Status: DC | PRN

## 2014-03-13 MED ORDER — SODIUM CHLORIDE 0.9 % IV SOLN: 100.0000 mL | INTRAVENOUS | Status: DC | PRN

## 2014-03-13 MED ORDER — HEPARIN SODIUM (PORCINE) 1000 UNIT/ML DIALYSIS: 1000.0000 [IU] | INTRAMUSCULAR | Status: DC | PRN

## 2014-03-13 MED ORDER — HEPARIN SODIUM (PORCINE) 1000 UNIT/ML DIALYSIS: 20.0000 [IU]/kg | INTRAMUSCULAR | Status: DC | PRN

## 2014-03-13 MED ORDER — SODIUM CHLORIDE 0.9 % IV SOLN
125.0000 mg | INTRAVENOUS | Status: DC
  Administered 2014-03-13 – 2014-03-20 (×4): 125 mg via INTRAVENOUS
  Filled 2014-03-13 (×5): qty 10

## 2014-03-13 MED ORDER — NEPRO/CARBSTEADY PO LIQD
237.0000 mL | ORAL | Status: DC | PRN
  Filled 2014-03-13: qty 237

## 2014-03-13 MED ORDER — LIDOCAINE HCL (PF) 1 % IJ SOLN: 5.0000 mL | INTRAMUSCULAR | Status: DC | PRN

## 2014-03-13 MED ORDER — PRO-STAT SUGAR FREE PO LIQD
30.0000 mL | Freq: Every day | ORAL | Status: DC
  Administered 2014-03-13 (×4): 30 mL via ORAL
  Filled 2014-03-13 (×10): qty 30

## 2014-03-13 MED ORDER — LIDOCAINE-PRILOCAINE 2.5-2.5 % EX CREA
1.0000 "application " | TOPICAL_CREAM | CUTANEOUS | Status: DC | PRN
  Filled 2014-03-13: qty 5

## 2014-03-13 MED ORDER — AMLODIPINE BESYLATE 10 MG PO TABS
10.0000 mg | ORAL_TABLET | Freq: Every day | ORAL | Status: DC
  Administered 2014-03-13 – 2014-03-18 (×6): 10 mg
  Filled 2014-03-13 (×7): qty 1

## 2014-03-13 NOTE — Progress Notes (Signed)
STROKE TEAM PROGRESS NOTE   HISTORY Lynn Gentry is an 43 y.o. female with no previously documented medical disorder who was brought to [facility] with new onset slurred speech and left-sided weakness. Patient went to work as usual until 1500. She was found by her son at home at 1600 unable to get up out of a chair with left sided weakness, slurred speech. Patient was last known well around 3 PM this afternoon 02/21/2014. CT scan of her head showed an acute 4.1 x 1.5 cm right basal ganglia hemorrhage with mild mass effect and right to left shift of 5 mm. Patient's blood pressure was markedly elevated at 226/111. She was started on Cardene drip and blood pressure responded well. She has shown continual respiratory difficulty. She was initially given oxygen by nasal cannula followed by NBR, and subsequently placed on BiPAP. She continued to have oxygen saturations only in the high 80s to low 90s on 100% O2, with a respiratory rates in the 40s. ABG showed pH of 2.82, PCO2 37 PO2 of 57. Chest x-ray showed mild cardiomegaly with possible asymmetric pulmonary edema. BNP was 36,455. Because of deteriorating respiratory status intubation and mechanical ventilation was elected. CCM service was consulted. Patient was not administered TPA secondary to ICH. She was admitted to the neuro ICU  for further evaluation and treatment.   SUBJECTIVE (INTERVAL HISTORY) Pt continued open eyes  to voice although not tracking and not following any commands. Spontaneously moving LUE and Withdraw to LLE on pain stimulation. Not moving RUE and trace withdraw to RLE. Still on vent with trach. Pt mom and husband at bedside, IR planned tomorrow and HD today.  OBJECTIVE Temp:  [98.2 F (36.8 C)-99.2 F (37.3 C)] 98.5 F (36.9 C) (10/26 2300) Pulse Rate:  [62-122] 64 (10/26 2300) Cardiac Rhythm:  [-] Sinus tachycardia;Normal sinus rhythm (10/26 0800) Resp:  [15-34] 15 (10/26 2300) BP: (99-182)/(47-115) 130/60 mmHg  (10/26 2300) SpO2:  [98 %-100 %] 100 % (10/26 2300) FiO2 (%):  [40 %] 40 % (10/26 1957) Weight:  [243 lb 6.2 oz (110.4 kg)] 243 lb 6.2 oz (110.4 kg) (10/26 0400)   Recent Labs Lab 03/13/14 0414 03/13/14 0741 03/13/14 1122 03/13/14 1651 03/13/14 2042  GLUCAP 116* 129* 162* 118* 134*    Recent Labs Lab 03/09/14 0450 03/10/14 0500 03/11/14 0345 03/12/14 0330 03/13/14 0340  NA 139 137 137 139 139  K 3.9 3.9 4.2 3.8 4.1  CL 94* 94* 95* 94* 95*  CO2 27 28 26 27 26   GLUCOSE 129* 115* 109* 113* 127*  BUN 45* 64* 38* 55* 72*  CREATININE 4.82* 7.17* 5.10* 7.27* 8.63*  CALCIUM 9.1 8.9 9.1 9.1 9.3  PHOS 5.9* 8.0* 4.9* 6.1* 7.0*    Recent Labs Lab 03/09/14 0450 03/10/14 0500 03/11/14 0345 03/12/14 0330 03/13/14 0340  ALBUMIN 2.3* 2.1* 2.4* 2.4* 2.5*    Recent Labs Lab 03/07/14 0845 03/08/14 0445 03/11/14 0345 03/12/14 0735 03/13/14 0340  WBC 9.0 10.1 13.1* 8.9 9.4  NEUTROABS  --  7.2  --   --   --   HGB 7.8* 7.6* 8.9* 7.6* 8.6*  HCT 24.7* 24.4* 29.2* 24.8* 27.9*  MCV 90.5 91.0 93.9 91.5 91.2  PLT 263 284 379 342 401*   No results found for this basename: CKTOTAL, CKMB, CKMBINDEX, TROPONINI,  in the last 168 hours No results found for this basename: LABPROT, INR,  in the last 72 hours No results found for this basename: COLORURINE, APPERANCEUR, LABSPEC, PHURINE, GLUCOSEU, HGBUR,  BILIRUBINUR, KETONESUR, PROTEINUR, UROBILINOGEN, NITRITE, LEUKOCYTESUR,  in the last 72 hours     Component Value Date/Time   CHOL 177 02/28/2014 0350   Lab Results  Component Value Date   HGBA1C 5.8* 02/23/2014   No results found for this basename: labopia,  cocainscrnur,  labbenz,  amphetmu,  thcu,  labbarb    No results found for this basename: ETH,  in the last 168 hours  I have personally reviewed the radiological images below and agree with the radiology interpretations.  Ct Head Wo Contrast 02/28/14 - Stable 4.2 x 1.1 cm right lenticular nucleus hematoma with mild   surrounding vasogenic edema. Local mass effect upon the right  lateral ventricle with slight bowing of the septum to the left by  3.9 mm without significant change.  This may represent result of hypertensive hemorrhage and can be  assessed as this clears.  Prominent white matter type changes asymmetric greater on the left  more conspicuous on the present exam than on the prior exam. The  patient may benefit from followup MR and MR angiogram for further  delineation. This may be ischemic in origin possibly related to  watershed type infarct. Other causes not excluded although felt to  be less likely.  02/24/2014 Stable appearance of the hematoma within the right basal ganglia. No new or progressive findings.  02/22/2014   1. No significant interval change in the patient's intraparenchymal hemorrhage at the right lateral basal ganglia, measuring 4.1 x 1.2 cm, with mild surrounding vasogenic edema. Mild associated mass effect, with 4 mm of leftward midline shift again seen. 2. Scattered small vessel ischemic microangiopathy. 3. Bilateral proptosis noted.     02/21/2014   1. Acute hemorrhage into the lateral right basal ganglia, likely indicating a hypertensive hemorrhage/hemorrhagic stroke. 2. Mild mass effect with shift of the midline to the left approximating 5 mm. Critical   US Renal Port 02/22/2014    1. Small echogenic kidneys consistent with chronic renal medical disease. No hydronephrosis. 2. The urinary bladder is decompressed by Foley catheter and cannot be evaluated.     Dg Chest Port 1 View 02/22/2014    1. Satisfactory positioning of endotracheal tube and right IJ line. 2. Cardiomegaly with persistent diffuse interstitial and airspace disease likely reflecting edema and congestive heart failure. 3. Bilateral pleural effusions and basilar airspace disease. This likely reflects atelectasis.    02/21/2014    Mild cardiac enlargement and possible asymmetric pulmonary edema. However, exam is  limited by body habitus.     2D echo - - Left ventricle: The cavity size was normal. There was severe concentric hypertrophy. Systolic function was normal. The estimated ejection fraction was in the range of 60% to 65%. Wall motion was normal; there were no regional wall motion abnormalities. - Left atrium: The atrium was mildly dilated. - Pulmonary arteries: PA peak pressure: 43 mm Hg (S). - Pericardium, extracardiac: A small, free-flowing pericardial effusion was identified posterior to the heart and circumferential to the heart. The fluid had no internal echoes.There was no evidence of hemodynamic compromise. Impressions: - The right ventricular systolic pressure was increased consistent with moderate pulmonary hypertension.  CUS - - Findings consistent with 1-39 percent stenosis involving the right internal carotid artery and the left internal carotid artery. - Right vertebral artery not imaged due to central line dressing. Left vertebral artery not imaged due to patient position.  DVT - - No obvious evidence of deep vein or superficial thrombosis involving the right lower extremity  and left lower extremity. - No evidence of Baker&'s cyst on the right or left.  EEG 02/28/14 - This EEG is abnormal with severe generalized slowing of cerebral activity consistent with severe encephalopathic state. These findings are nonspecific and can be seen with metabolic as well as toxic and degenerative encephalopathies. No evidence of an epileptic disorder was demonstrated.  MRI Brain 03/08/2014 1. Stable size and morphology of 4.5 x 1.4 x 2.8 cm subacute  intraparenchymal hematoma involving the right lentiform nucleus with  associated vasogenic edema and mass effect on the right lateral  ventricle. There is persistent 4 mm of right-to-left midline shift.  2. Acute left MCA territory infarct involving the deep white matter  of the left frontoparietal lobes. This is largely watershed in   distribution.  3. Additional small infarcts within the deep white matter of the  right frontal lobe, right parieto-occipital region, and left pons.   MRA HEAD 03/08/14 1. Multi focal irregularity with associated stenoses within the  supra clinoid left ICA, left M1 segment, and possibly left A1  segment as above. Possible vasculitis or drug exposure could be  considered  A1C 5.8 and LDL 92  PHYSICAL EXAM Young obese Caucasian lady not in distress. On trach. Afebrile. Head is nontraumatic. Neck is supple without bruit. Cardiac exam soft ejection murmur RRR .no gallop. Lungs bilateral crackles and  rhonchi to auscultation. Distal pulses are well felt.   Neurological Exam: still on ventilation with trach, eyes open on voice but not tracking and not following commands.Aphasic Eyes in the right side gaze position. Pupils 3 mm reactive. Fundi not visualized. Left lower face weakness, corneal and gag present. Spontaneously moving LUE and Withdraw to LLE on pain stimulation. Not moving RUE and trace withdraw to RLE. RUE spastic on the flexed position and RLE also increased muscle tone. Left Plantar upgoing and right down going.  ASSESSMENT/PLAN Ms. Lynn Gentry is a 43 y.o. female with no documented medical history found with new onset slurred speech and left-sided weakness. CT showed right external capsule hemorrhage. However, her condition getting worse with pulmonary edema, AKI requiring HD, respiratory failure needing ventilation. Transferred pt to CCM for better management. Days after admission, pt was found to not moving on the right, CT at that time no change but later repeat CT showed left subcortical hypodensity. MRI not able to perform initially due to not able to lie flat. But recently done showed extensive left subcortical stroke as well as punctate right frontal deep WM and right parietoccipital region as well as left pons.   Stroke:  right basal ganglia hemorrhage secondary to  malignant hypertension, nondominant side with cytotoxic edema, mild transfalcine herniation, pulmonary edema, respiratory and renal failure  CT head 10/9 showed stable hematoma  CT head repeat 10/13 showed stable hematoma on the right but more clear left MCA ischemic changes  MRI showed stable right BG hematoma but moderate size left MCA and small right subcortical and left pontine infarcts with MRA showing left M1 high grade stenosis raising possibility of CNS vasculitis. Will complete some peripheral markers for systemic vasculitis, so far negative. Due to her general neurological and medical condition she is not a candidate at the present time for brain biopsy for definitive diagnosis of CNS vasculitis.Plan to check urine drug screen but patient is anuric hence unable to do so.  Cerebral angiogram planned tomorrow.  EEG showed severe slowing but no seizure.  2D echo unremarkable  Carotid doppler unremarkable  HgbA1c 5.8  Heparin  subq for VTE prophylaxis  Resultant VDRF, aphasia dense right hemiplegia with spasticity and mild left hemiparesis  no antithrombotics prior to admission, currently not on antithrombotics due to hemorrhage.  Ongoing aggressive risk factor management  Acute Respiratory Failure with severe acute pulmonary edema  trach on 03/02/14  CCM following  Diuresing, IV Lasix, Hemodialysis, urine output minimal today  CXR series  Malignant Hypertension  BP 226/111 at onset  Home meds:   none  SBP goal < 150 due to presence of end organ damage  On amlodipine, clonidine patch and metoprolol and hydralazine and enalapril  off cardene   BP stabilized  Acute renal failure with volume overload, suspect baseline chronic kidney disease  Diuresis and management per critical care, on HD  Continue to trend Cre.  HD as per nephrology  Other Stroke Risk Factors ETOH use   Morbid Obesity, Body mass index is 41.76 kg/(m^2).   Hospital day # 20 03/13/2014  11:33 PM   Marvel Plan, MD PhD Stroke Neurology 03/13/2014 11:33 PM   To contact Stroke Continuity provider, please refer to [email or web address]. After hours, contact General Neurology

## 2014-03-13 NOTE — Progress Notes (Signed)
Patient ID: Lynn Gentry, female   DOB: [birth year], 43 y.o.   MRN: [id number]   Referring Physician(s): Dr. Roda ShuttersXu  Subjective: Pt occ opens eyes, not F/C; husband in room; request now received from neurology for cerebral arteriogram to r/o vasculitis. Pt known to our service from recent HD cath placement on 10/20.   Allergies: Review of patient's allergies indicates no known allergies.  Medications: Prior to Admission medications   Medication Sig Start Date End Date Taking? Authorizing Provider  cetirizine (ZYRTEC) 10 MG tablet Take 10 mg by mouth daily as needed for allergies.   Yes Historical Provider, MD    Review of Systems   Vital Signs: BP 115/59  Pulse 74  Temp(Src) 99.1 F (37.3 C) (Oral)  Resp 15  Ht 5\' 4"  (1.626 m)  Wt 243 lb 6.2 oz (110.4 kg)  BMI 41.76 kg/m2  SpO2 100%  LMP 03/01/2014  Physical Exam pt opens eyes to voice, not F/C; spontaneously moving LUE and withdraw to LLE on pain stimulation. Not moving RUE and trace withdraw to RLE. Still on vent with trach. Husband in room; chest- scatt rhonchi; heart- RRR; abd- soft,+BS, intact G tube; ext- 2+ edema.  Imaging: Dg Chest Port 1 View  03/13/2014   CLINICAL DATA:  Subsequent evaluation of pneumonia  EXAM: PORTABLE CHEST - 1 VIEW  COMPARISON:  03/09/2014  FINDINGS: Support apparatus is unchanged. Very limited inspiratory effect. At least mild cardiac silhouette enlargement, stable. No consolidation appreciated on either side. Vascular congestion again noted.  IMPRESSION: Again very limited study due to limited inspiratory effect with evidence of cardiac silhouette enlargement and vascular congestion.   Electronically Signed   By: Esperanza Heiraymond  Rubner M.D.   On: 03/13/2014 07:54   Dg Chest Port 1 View  03/09/2014   CLINICAL DATA:  Acute respiratory failure, hypoxia  EXAM: PORTABLE CHEST - 1 VIEW  COMPARISON:  Portable chest x-ray of March 07, 2014  FINDINGS: The lung volumes remain low. The tracheostomy appliance  tip lies along the superior margin of the clavicular heads. The pulmonary interstitial markings remain mildly increased. The cardiopericardial silhouette remains enlarged and the pulmonary vascularity is indistinct. There is no significant pleural effusion. A dialysis type dual lumen catheter is in place with the tips projecting over the midportion of the SVC.  IMPRESSION: There has not been significant interval change in the appearance of the chest since yesterday's study. Findings are consistent with congestive heart failure accentuated by bilateral pulmonary hypo inflation.   Electronically Signed   By: David  [place]azil[place]   On: 03/09/2014 12:51    Labs:  CBC:  Recent Labs  03/08/14 0445 03/11/14 0345 03/12/14 0735 03/13/14 0340  WBC 10.1 13.1* 8.9 9.4  HGB 7.6* 8.9* 7.6* 8.6*  HCT 24.4* 29.2* 24.8* 27.9*  PLT 284 379 342 401*    COAGS:  Recent Labs  02/21/14 1900 03/02/14 0845 03/06/14 0430  INR 1.14 1.45 1.38  APTT  --  27  --     BMP:  Recent Labs  03/10/14 0500 03/11/14 0345 03/12/14 0330 03/13/14 0340  NA 137 137 139 139  K 3.9 4.2 3.8 4.1  CL 94* 95* 94* 95*  CO2 28 26 27 26   GLUCOSE 115* 109* 113* 127*  BUN 64* 38* 55* 72*  CALCIUM 8.9 9.1 9.1 9.3  CREATININE 7.17* 5.10* 7.27* 8.63*  GFRNONAA 6* 9* 6* 5*  GFRAA 7* 11* 7* 6*    LIVER FUNCTION TESTS:  Recent Labs  02/21/14 1900 02/24/14 0450  03/10/14 0500 03/11/14 0345 03/12/14 0330 03/13/14 0340  BILITOT 0.9 0.6  --   --   --   --   --   AST 25 20  --   --   --   --   --   ALT 30 16  --   --   --   --   --   ALKPHOS 152* 112  --   --   --   --   --   PROT 7.9 6.7  --   --   --   --   --   ALBUMIN 3.7 2.8*  < > 2.1* 2.4* 2.4* 2.5*  < > = values in this interval not displayed.  Assessment and Plan: Pt with hx of right basal ganglia hemorrhage secondary to malignant hypertension, nondominant side with cytotoxic edema, mild transfalcine herniation, pulmonary edema/PNA, respiratory and renal  failure. Request received from neurology for cerebral arteriogram to r/o vasculitis. Imaging studies were reviewed by Dr. Corliss Skains. Plan is for procedure on 10/27. Pt on tube feeds currently with M/W/F HD schedule. Details/risks of procedure d/w pt's husband with his understanding and consent.      I spent a total of 15 minutes face to face in clinical consultation/evaluation, greater than 50% of which was counseling/coordinating care for cerebral arteriogram  Signed: Tonianne Fine,D [facility] 03/13/2014, 11:42 AM

## 2014-03-13 NOTE — Progress Notes (Signed)
PULMONARY / CRITICAL CARE MEDICINE   Name: Lynn Gentry MRN: [id number] DOB: [birth year]    ADMISSION DATE:  02/21/2014 CONSULTATION DATE:  03/13/2014  REFERRING MD :  Pearlean Brownie  CHIEF COMPLAINT:  Respiratory failure in setting of ICH  INITIAL PRESENTATION: 43 y.o. F brought to [facility] ED on 10/6 with left sided weakness, slurred speech, inability to stand up.  In ED, found to have acute lateral right basal ganglia ICH with mild mass effect and 48mm MLS.  Admitted to neuro ICU and developed acute respiratory failure, failed bipap and PCCM called for intubation and management. She was admitted to neuro ICU and later that evening had increasing O2 demands.  She had no improvement in SpO2, work of breathing, and mental status despite 3 hours of BiPAP.  PCCM was consulted for intubation.  STUDIES:  10/06 CT head:  acute hemorrhage into the lateral right basal ganglia, likely indicating a hypertensive hemorrhage/hemorrhagic stroke.  Mild mass effect with 30mm MLS to the left. 10/07 Renal US:  echogenic kidneys s/w ckd, no hydro.  10/07 CT head: [facility] 10/07 repeat CT head: [facility] 10/07 Doppler carotid: consistent with 1-39% stenosis involving the R internal carotid art and the L internal carotid art 10/07 TTE: LVEF 60-65%. Severe concentric LVH. LA dilatation. PASP est 43 mmHg 10/09 CT head: [facility] 10/09 LE venous Dopplers (ordered by Stroke) >> negative 10/21 MRI / MRA brain >> stable subacute intraparenchymal hemorrhage, localized vasogenic edema, mild mass effect, 4 mm R to L shift, acute L MCA infarct, largely watershed in distribution, additional small infarcts in deep white matter  SIGNIFICANT EVENTS: 10/06  Admitted for ICH, developed respiratory failure requiring intubation. 10/07  Persistent hypertension. Remained on nicardipine gtt. Persistent volume overload snd pulm edema pattern on CXR. Lasix gtt resulted in minimal increase in Uo 10/08  Worsening renal function, oliguria. Renal consult. HD  performed 10/09  Acute change in neuro status. STAT CT head ordered: [facility] 10/09  Further HD planned 10/14  HD with negative fluid balance 10/15  Trach (JY) 10/16  Febrile and purulent matter from nasal cavity and trach. 10/17  Transition from fentanyl gtt to Duragesic patch. Begin PSV weaning 10/18  Transfer to vent SDU bed ordered. Tolerating PSV 10 cm H2O 10/19  Transfer to SDU/2900 10/21  Off vent 36hours, but went back on in evening for secretions/tachypnea; Persistent diarrhea, d/c'd senokot, Family conversation from [place] explaining dismal prognosis 10/22  Mucus plugging this AM; Neurology had discussion with family explaining dismal prognosis, family desires full code 10/23- active pall care discussions 10/25- pall care meeting not much progress  SUBJ -no changes, increased HR and BP with weaning.  VITAL SIGNS: Temp:  [98.2 F (36.8 C)-98.8 F (37.1 C)] 98.8 F (37.1 C) (10/26 0700) Pulse Rate:  [63-122] 122 (10/26 1000) Resp:  [15-38] 31 (10/26 1000) BP: (116-182)/(56-97) 171/91 mmHg (10/26 1000) SpO2:  [100 %] 100 % (10/26 1000) FiO2 (%):  [40 %] 40 % (10/26 0946) Weight:  [110.4 kg (243 lb 6.2 oz)] 110.4 kg (243 lb 6.2 oz) (10/26 0400)  VENTILATOR SETTINGS: Vent Mode:  [-] PRVC FiO2 (%):  [40 %] 40 % Set Rate:  [15 bmp] 15 bmp Vt Set:  [500 mL] 500 mL PEEP:  [5 cmH20] 5 cmH20 Pressure Support:  [5 cmH20] 5 cmH20 Plateau Pressure:  [22 cmH20-27 cmH20] 27 cmH20  INTAKE / OUTPUT: Intake/Output     10/25 0701 - 10/26 0700 10/26 0701 - 10/27 0700   I.V. (mL/kg) 240 (2.2) 20 (  0.2)   Other 120    NG/GT 960 80   Total Intake(mL/kg) 1320 (12) 100 (0.9)   Stool 1    Total Output 1     Net +1319 +100        Stool Occurrence 2 x     PHYSICAL EXAMINATION:  Gen: only opens eyes to voice that's it, no other responses HEENT: Trach site clean PULM: ronchi mild diffuse CV: distant tones RRR, no mgr AB: BS+, soft, nontender, PEG Ext: 2 plus  edema Neuro: opens eyes  to voice, no movement of extremities on my exam, no changes  LABS:  I have reviewed all of today's lab results. Relevant abnormalities are discussed in the A/P section  10/22 CXR > stable bilateral airspace disease  ASSESSMENT / PLAN:  PULMONARY ETT 10/6 >> 10/15 Trach tube 10/15 >>  A: Acute hypoxemic respiratory failure > nearly completely off vent support HCAP> MSSA Mucus plugging 10/22 Edema component P:   Guaifenesin Chest PT as able Trach collar on hold given HR and HTN with weaning, will hold further for now. Cont vent bundle Neg balance on HD needed, due Monday  CARDIOVASCULAR CVL R IJ 10/6 >>  A:  HTN persistent Diastolic CHF > currently mild volume overload P:  Unable to remove cvl due to poor IV access, and no PICC given renal function. Continue hydralazine, clonidine, metoprolol, norvasc, enalapril Follow BP after HD in am  PRN hydralazine to maintain SBP < 170 mmHg PRN metoprolol to maintain HR < 115/min  RENAL L IJ HD cath 10/08 >> 10/20 Perm Cath L [place] 10/20 >> A:   Acute renal failure> now ESRD  P:   kvo HD today  GASTROINTESTINAL A:   Obesity P:   SUP: Pantoprazole Cont TFs tolerated well  HEMATOLOGIC A:   ICU acquired anemia P:  DVT px: SQ heparin Monitor CBC intermittently and after neg balance hemoconcetration Transfuse per usual ICU guidelines  INFECTIOUS A:   HCAP > MSSA P:   Resp 10/17 >> Abundant staph aureus MSSA Blood 10/16 >> ng C.diff 10/20 >> neg  Fluconazole 10/15 >> 10/17 Vancomycin 10/16 >> 10/22 Zosyn 10/16 >> 10/20 Ancef 10/21 >> MWF with HD plan will plan of 10 days  ENDOCRINE A:   Hyperglycemia, controlled P:   CBG's q4hr Cont SSI  NEUROLOGIC A:   Hypertensive R basal Ganglion Hemorrhage  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   TODAY'S SUMMARY: HD today, will place LTAC consult.  Alyson ReedyWesam G. Gabbriella Presswood, M.D. [facility] Pulmonary/Critical Care Medicine. Pager: [phone]. After hours pager: [phone].

## 2014-03-13 NOTE — Progress Notes (Signed)
ANTIBIOTIC CONSULT NOTE - FOLLOW UP  Pharmacy Consult for Cefazolin Indication: MSSA PNA  No Known Allergies  Patient Measurements: Height: 5\' 4"  (162.6 cm) Weight: 243 lb 6.2 oz (110.4 kg) IBW/kg (Calculated) : 54.7  Vital Signs: Temp: 99.1 F (37.3 C) (10/26 1100) Temp Source: Oral (10/26 1100) BP: 115/59 mmHg (10/26 1100) Pulse Rate: 74 (10/26 1100) Intake/Output from previous day: 10/25 0701 - 10/26 0700 In: 1320 [I.V.:240; NG/GT:960] Out: 1 [Stool:1] Intake/Output from this shift: Total I/O In: 200 [I.V.:40; NG/GT:160] Out: -   Labs:  Recent Labs  03/11/14 0345 03/12/14 0330 03/12/14 0735 03/13/14 0340  WBC 13.1*  --  8.9 9.4  HGB 8.9*  --  7.6* 8.6*  PLT 379  --  342 401*  CREATININE 5.10* 7.27*  --  8.63*   Estimated Creatinine Clearance: 10.2 ml/min (by C-G formula based on Cr of 8.63). No results found for this basename: VANCOTROUGH, Leodis Binet, VANCORANDOM, GENTTROUGH, GENTPEAK, GENTRANDOM, TOBRATROUGH, TOBRAPEAK, TOBRARND, AMIKACINPEAK, AMIKACINTROU, AMIKACIN,  in the last 72 hours   Microbiology: Recent Results (from the past 720 hour(s))  MRSA PCR SCREENING     Status: Abnormal   Collection Time    02/21/14  9:37 PM      Result Value Ref Range Status   MRSA by PCR POSITIVE (*) NEGATIVE Final   Comment:            The GeneXpert MRSA Assay (FDA     approved for NASAL specimens     only), is one component of a     comprehensive MRSA colonization     surveillance program. It is not     intended to diagnose MRSA     infection nor to guide or     monitor treatment for     MRSA infections.     RESULT CALLED TO, READ BACK BY AND VERIFIED WITH:     M.BOULDEN,RN 0154 02/22/14 M.CAMPBELL  CULTURE, BLOOD (ROUTINE X 2)     Status: None   Collection Time    03/03/14 10:02 AM      Result Value Ref Range Status   Specimen Description BLOOD RIGHT HAND   Final   Special Requests BOTTLES DRAWN AEROBIC ONLY 3CC   Final   Culture  Setup Time     Final   Value: 03/03/2014 17:08     Performed at [facility]   Culture     Final   Value: NO GROWTH 5 DAYS     Performed at [facility]   Report Status 03/09/2014 FINAL   Final  CULTURE, BLOOD (ROUTINE X 2)     Status: None   Collection Time    03/03/14  2:30 PM      Result Value Ref Range Status   Specimen Description BLOOD HEMODIALYSIS CATHETER   Final   Special Requests BOTTLES DRAWN AEROBIC AND ANAEROBIC 10CC   Final   Culture  Setup Time     Final   Value: 03/03/2014 20:41     Performed at [facility]   Culture     Final   Value: NO GROWTH 5 DAYS     Performed at [facility]   Report Status 03/09/2014 FINAL   Final  CULTURE, RESPIRATORY (NON-EXPECTORATED)     Status: None   Collection Time    03/04/14  9:30 AM      Result Value Ref Range Status   Specimen Description TRACHEAL ASPIRATE   Final   Special Requests  NONE   Final   Gram Stain     Final   Value: ABUNDANT WBC PRESENT,BOTH PMN AND MONONUCLEAR     FEW SQUAMOUS EPITHELIAL CELLS PRESENT     MODERATE GRAM POSITIVE COCCI IN PAIRS     FEW GRAM POSITIVE RODS     Performed at [facility]   Culture     Final   Value: ABUNDANT STAPHYLOCOCCUS AUREUS     Note: RIFAMPIN AND GENTAMICIN SHOULD NOT BE USED AS SINGLE DRUGS FOR TREATMENT OF STAPH INFECTIONS.     Performed at [facility]   Report Status 03/08/2014 FINAL   Final   Organism ID, Bacteria STAPHYLOCOCCUS AUREUS   Final  CLOSTRIDIUM DIFFICILE BY PCR     Status: None   Collection Time    03/06/14  3:00 AM      Result Value Ref Range Status   C difficile by pcr NEGATIVE  NEGATIVE Final  CULTURE, RESPIRATORY (NON-EXPECTORATED)     Status: None   Collection Time    03/09/14 11:51 AM      Result Value Ref Range Status   Specimen Description TRACHEAL ASPIRATE   Final   Special Requests Normal   Final   Gram Stain     Final   Value: FEW WBC PRESENT, PREDOMINANTLY PMN     RARE SQUAMOUS EPITHELIAL CELLS PRESENT     NO  ORGANISMS SEEN     Performed at [facility]   Culture     Final   Value: NO GROWTH 2 DAYS     Performed at [facility]   Report Status 03/11/2014 FINAL   Final    Anti-infectives   Start     Dose/Rate Route Frequency Ordered Stop   03/10/14 1800  ceFAZolin (ANCEF) IVPB 2 g/50 mL premix     2 g 100 mL/hr over 30 Minutes Intravenous Every M-W-F (1800) 03/08/14 1132     03/08/14 1600  ceFAZolin (ANCEF) IVPB 2 g/50 mL premix     2 g 100 mL/hr over 30 Minutes Intravenous  Once 03/08/14 1132 03/08/14 1722   03/07/14 1130  vancomycin (VANCOCIN) 1,500 mg in sodium chloride 0.9 % 500 mL IVPB     1,500 mg 250 mL/hr over 120 Minutes Intravenous  Once 03/07/14 1121 03/07/14 1422   03/06/14 1200  vancomycin (VANCOCIN) IVPB 1000 mg/200 mL premix  Status:  Discontinued     1,000 mg 200 mL/hr over 60 Minutes Intravenous Every M-W-F (Hemodialysis) 03/05/14 1338 03/06/14 0902   03/06/14 0600  ceFAZolin (ANCEF) IVPB 2 g/50 mL premix    Comments:  hang ON CALL to xray 10/19   2 g 100 mL/hr over 30 Minutes Intravenous On call 03/04/14 1117 03/06/14 0726   03/04/14 1600  vancomycin (VANCOCIN) IVPB 1000 mg/200 mL premix     1,000 mg 200 mL/hr over 60 Minutes Intravenous  Once 03/04/14 1502 03/04/14 1654   03/03/14 1000  piperacillin-tazobactam (ZOSYN) IVPB 2.25 g  Status:  Discontinued     2.25 g 100 mL/hr over 30 Minutes Intravenous 3 times per day 03/03/14 0917 03/07/14 1121   03/03/14 1000  vancomycin (VANCOCIN) 1,500 mg in sodium chloride 0.9 % 500 mL IVPB     1,500 mg 250 mL/hr over 120 Minutes Intravenous  Once 03/03/14 0918 03/03/14 1213   03/02/14 1300  fluconazole (DIFLUCAN) IVPB 200 mg  Status:  Discontinued     200 mg 100 mL/hr over 60 Minutes Intravenous Every 24 hours 03/02/14  1205 03/04/14 0932      Assessment: 43yof with MSSA PNA on Cefazolin. WBC 9.4, afeb and blood cultures remain NGTD. Patient has AKI/CKD but is requiring HD - per Renal notes, continuing HD MWF  for now. Noted plans for 10 days antibiotics (Zosyn 10/16>>10/20; ancef 10/21>>; currently day 11 of antibiotics). Discussed with Dr. Molli KnockYacoub; may discontinue ancef.  Zosyn 10/16 >>10/20 Fluconazole 10/15 >> 10/17 Vanc 10/16 >10/21 Ancef 10/21 >   Plan:  -Discontinue ancef as patient has completed 10 days treatment -Pharmacy will sign off for now. Please call for any other needs  Thank you, Harland Germanndrew Areyanna Figeroa, Pharm D 03/13/2014 11:49 AM

## 2014-03-13 NOTE — Progress Notes (Addendum)
NUTRITION FOLLOW UP  Intervention:   Initiate Nepro @ 20 ml/hr via PEG.  30 ml Prostat 5 times daily.    Tube feeding regimen provides 1364 kcal (100% of needs), 114 grams of protein, and 349 ml of H2O.   Nutrition Dx:   Inadequate oral intake related to inability to eat as evidenced by NPO status; ongoing.   Goal:   Enteral nutrition to provide 60-70% of estimated calorie needs (22-25 kcals/kg ideal body weight) and 100% of estimated protein needs, based on ASPEN guidelines for permissive underfeeding in critically ill obese individuals; met.  Monitor:   Respiratory status, HD, TF tolerance, labs  Assessment:   Pt admitted 10/6 with left sided weakness, slurred speech, inability to stand up. In ED, found to have acute lateral right basal ganglia ICH with mild mass effect and 99m MLS. Pt developed respiratory failure and was intubated.   Pt scheduled for cerebral angiogram with IR today to r/o vasculitis.  Per chart review, pt remains with poor prognosis with little change of meaningful recovery. Medical team has had many discussions about goals of care with family, however, pt family continues to have unrealistic expectations for pt. [facility]    Patient is currently intubated on ventilator support. Noted pt was re-intubated over the weekend. She is trach and PEG dependent.  MV: 15.9 L/min Temp (24hrs), Avg:98.6 F (37 C), Min:98.2 F (36.8 C), Max:98.8 F (37.1 C)  Pt is currently receiving Nepro @ 40 ml/hr via PEG, provides 1728 kcal (144% of needs), 78 grams of protein, and 698 ml of H2O.  Nutrition needs were re-estimated due to intubation. Will make adjustments to TF to reflect changes in nutritional needs.  Labs reviewed. Cl: 95, BUN/Creat: 72/8.63, Phos remains elevated (7.0), Glucose: 127, CBGS: 116-129. Mg WDL.   Height: Ht Readings from Last 1 Encounters:  02/21/14 5' 4"  (1.626 m)    Weight Status:   Wt  Readings from Last 1 Encounters:  03/13/14 243 lb 6.2 oz (110.4 kg)   03/08/14  225 lb 15.5 oz (102.5 kg)    03/03/14  253 lb 4.9 oz (114.9 kg)   Admission weight: 276 lb (125.5 kg) 10/6  Re-estimated needs:  Kcal: 2153.3 Estimated Kcal Needs for MASPEN Underfeeding Protocol: 19643-8381Protein: >109 grams Fluid: >1.5 L  Skin: WDL, LUQ PEG  Diet Order:  NPO   Intake/Output Summary (Last 24 hours) at 03/13/14 0925 Last data filed at 03/13/14 0900  Gross per 24 hour  Intake   1320 ml  Output      1 ml  Net   1319 ml    Last BM: 03/12/14   Labs:   Recent Labs Lab 03/11/14 0345 03/12/14 0330 03/13/14 0340  NA 137 139 139  K 4.2 3.8 4.1  CL 95* 94* 95*  CO2 26 27 26   BUN 38* 55* 72*  CREATININE 5.10* 7.27* 8.63*  CALCIUM 9.1 9.1 9.3  PHOS 4.9* 6.1* 7.0*  GLUCOSE 109* 113* 127*    CBG (last 3)   Recent Labs  03/12/14 2356 03/13/14 0414 03/13/14 0741  GLUCAP 123* 116* 129*    Scheduled Meds: . amLODipine  10 mg Per Tube QHS  . antiseptic oral rinse  7 mL Mouth Rinse QID  .  ceFAZolin (ANCEF) IV  2 g Intravenous Q M,W,F-1800  . chlorhexidine  15 mL Mouth Rinse BID  . cloNIDine  0.3 mg Transdermal Q Mon  . darbepoetin (ARANESP) injection -  DIALYSIS  100 mcg Intravenous Q Mon-HD  . enalapril  5 mg Per Tube Daily  . feeding supplement (NEPRO CARB STEADY)  1,000 mL Per Tube Q24H  . guaiFENesin  10 mL Per Tube TID  . heparin subcutaneous  5,000 Units Subcutaneous 3 times per day  . hydrALAZINE  100 mg Per Tube 4 times per day  . insulin aspart  0-15 Units Subcutaneous 6 times per day  . metoprolol tartrate  50 mg Per Tube BID  . multivitamin  5 mL Per Tube Daily  . pantoprazole sodium  40 mg Per Tube Q1200  . scopolamine  1 patch Transdermal Q72H    Continuous Infusions: . sodium chloride 10 mL/hr at 03/11/14 2000    Jonne Rote A. Jimmye Norman, RD, LDN Pager: [phone] After hours Pager: [phone]

## 2014-03-13 NOTE — Progress Notes (Signed)
Patient ID: Lynn Gentry, female    DOB: 10/13/1970, 43 y.o.   MRN: [id number] Nephrology Progress Note  S: Nonverbal, opens eyes in response to verbal stimuli  O:BP 153/83  Pulse 108  Temp(Src) 98.8 F (37.1 C) (Oral)  Resp 28  Ht 5\' 4"  (1.626 m)  Wt 243 lb 6.2 oz (110.4 kg)  BMI 41.76 kg/m2  SpO2 100%  LMP 03/01/2014  Intake/Output Summary (Last 24 hours) at 03/13/14 0749 Last data filed at 03/13/14 0700  Gross per 24 hour  Intake   1320 ml  Output      1 ml  Net   1319 ml   Intake/Output: I/O last 3 completed shifts: In: 2240 [I.V.:400; Other:240; NG/GT:1600] Out: 1 [Stool:1]  Intake/Output this shift:    Weight change: 2 lb 13.9 oz (1.3 kg) Gen: NAD HEENT: dilated pupils bilaterally, minimally reactive to light CVS: RRR, no mrg Gr2/6 holosys M Resp: transmitted upper airway sounds Scattered rhonchi, decreased bs Abd: soft, obese, +BS Liver down 5 cm Ext: 1+ pedal edema Neuro: opens eyes to tactile and verbal stimuli. Raises left hand, moved left foot   Recent Labs Lab 03/07/14 0530 03/08/14 0445 03/09/14 0450 03/10/14 0500 03/11/14 0345 03/12/14 0330 03/13/14 0340  NA 140 140 139 137 137 139 139  K 3.8 4.4 3.9 3.9 4.2 3.8 4.1  CL 96 97 94* 94* 95* 94* 95*  CO2 25 25 27 28 26 27 26   GLUCOSE 124* 118* 129* 115* 109* 113* 127*  BUN 80* 99* 45* 64* 38* 55* 72*  CREATININE 5.98* 7.92* 4.82* 7.17* 5.10* 7.27* 8.63*  ALBUMIN 2.1* 2.0* 2.3* 2.1* 2.4* 2.4* 2.5*  CALCIUM 8.5 8.7 9.1 8.9 9.1 9.1 9.3  PHOS 6.0* 8.9* 5.9* 8.0* 4.9* 6.1* 7.0*   Liver Function Tests:  Recent Labs Lab 03/11/14 0345 03/12/14 0330 03/13/14 0340  ALBUMIN 2.4* 2.4* 2.5*   No results found for this basename: LIPASE, AMYLASE,  in the last 168 hours No results found for this basename: AMMONIA,  in the last 168 hours CBC:  Recent Labs Lab 03/07/14 0845 03/08/14 0445 03/11/14 0345 03/12/14 0735 03/13/14 0340  WBC 9.0 10.1 13.1* 8.9 9.4  NEUTROABS  --  7.2  --   --   --    HGB 7.8* 7.6* 8.9* 7.6* 8.6*  HCT 24.7* 24.4* 29.2* 24.8* 27.9*  MCV 90.5 91.0 93.9 91.5 91.2  PLT 263 284 379 342 401*   Cardiac Enzymes: No results found for this basename: CKTOTAL, CKMB, CKMBINDEX, TROPONINI,  in the last 168 hours CBG:  Recent Labs Lab 03/12/14 1205 03/12/14 1559 03/12/14 1918 03/12/14 2356 03/13/14 0414  GLUCAP 138* 120* 120* 123* 116*    Iron Studies: No results found for this basename: IRON, TIBC, TRANSFERRIN, FERRITIN,  in the last 72 hours Studies/Results: No results found. Marland Kitchen. amLODipine  10 mg Per Tube Daily  . antiseptic oral rinse  7 mL Mouth Rinse QID  .  ceFAZolin (ANCEF) IV  2 g Intravenous Q M,W,F-1800  . chlorhexidine  15 mL Mouth Rinse BID  . cloNIDine  0.3 mg Transdermal Q Mon  . darbepoetin (ARANESP) injection - DIALYSIS  100 mcg Intravenous Q Mon-HD  . enalapril  5 mg Per Tube Daily  . feeding supplement (NEPRO CARB STEADY)  1,000 mL Per Tube Q24H  . guaiFENesin  10 mL Per Tube TID  . heparin subcutaneous  5,000 Units Subcutaneous 3 times per day  . hydrALAZINE  100 mg Per Tube 4 times per  day  . insulin aspart  0-15 Units Subcutaneous 6 times per day  . metoprolol tartrate  50 mg Per Tube BID  . multivitamin  5 mL Per Tube Daily  . pantoprazole sodium  40 mg Per Tube Q1200  . scopolamine  1 patch Transdermal Q72H    BMET    Component Value Date/Time   NA 139 03/13/2014 0340   K 4.1 03/13/2014 0340   CL 95* 03/13/2014 0340   CO2 26 03/13/2014 0340   GLUCOSE 127* 03/13/2014 0340   BUN 72* 03/13/2014 0340   CREATININE 8.63* 03/13/2014 0340   CALCIUM 9.3 03/13/2014 0340   GFRNONAA 5* 03/13/2014 0340   GFRAA 6* 03/13/2014 0340   CBC    Component Value Date/Time   WBC 9.4 03/13/2014 0340   RBC 3.06* 03/13/2014 0340   HGB 8.6* 03/13/2014 0340   HCT 27.9* 03/13/2014 0340   PLT 401* 03/13/2014 0340   MCV 91.2 03/13/2014 0340   MCH 28.1 03/13/2014 0340   MCHC 30.8 03/13/2014 0340   RDW 19.1* 03/13/2014 0340   LYMPHSABS  1.3 03/08/2014 0445   MONOABS 1.3* 03/08/2014 0445   EOSABS 0.2 03/08/2014 0445   BASOSABS 0.1 03/08/2014 0445   Assessment/Plan: 43 y.o. female with no significant known PMH admitted for right basal ganglia hemorrhage, underwent intubation for declining respiratory status and oliguric, started on HD.   1. AKI vs. Subacute vs. AKI/CKD- oliguric requiring dialysis. S/p HD treatments with marked improvement of volume and BP  1. Cont with HD MWF.Lengthen for adequate therapy 2. Remains oliguric and dialysis-dependent  3. Renal U/S suggests she is end-stage; ok to do the catheter angiogram to eval for CNS vasculitis if deemed appropriate by neuro.  4. HD today. UF goal 4.5L 2. Vascular access: TDC placed left IJ 10/20. Will use this week then get PC 3. Anemia: on aranesp. Iron <10, ferritin 76. May consider adding feraheme. 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 Elevated phos. Treat phos, indic of chronicity of illness 5. Neuro- MRI 10/21 with stable hematoma lentiform nucleas, acute left MCA infarct, small infarcts right frontal, parieto-occiptal, left pons. Rheum labs drawn. 6. Malignant HTN: on amlodipine, enalapril, hydralazine.   [name] 7:49 AM I have seen and examined this patient and agree with the plan of care seen, eval, examined.  Difficult situation.  Will give time but not candidate for long term tx unless improves .  Reggie Welge L 03/13/2014, 1:35 PM

## 2014-03-14 ENCOUNTER — Inpatient Hospital Stay (HOSPITAL_COMMUNITY): Payer: 59

## 2014-03-14 LAB — CBC
HEMATOCRIT: 28.1 % — AB (ref 36.0–46.0)
Hemoglobin: 8.7 g/dL — ABNORMAL LOW (ref 12.0–15.0)
MCH: 28.3 pg (ref 26.0–34.0)
MCHC: 31 g/dL (ref 30.0–36.0)
MCV: 91.5 fL (ref 78.0–100.0)
Platelets: 398 10*3/uL (ref 150–400)
RBC: 3.07 MIL/uL — ABNORMAL LOW (ref 3.87–5.11)
RDW: 19.8 % — ABNORMAL HIGH (ref 11.5–15.5)
WBC: 9.5 10*3/uL (ref 4.0–10.5)

## 2014-03-14 LAB — GLUCOSE, CAPILLARY
GLUCOSE-CAPILLARY: 117 mg/dL — AB (ref 70–99)
GLUCOSE-CAPILLARY: 113 mg/dL — AB (ref 70–99)
GLUCOSE-CAPILLARY: 103 mg/dL — AB (ref 70–99)
GLUCOSE-CAPILLARY: 116 mg/dL — AB (ref 70–99)
Glucose-Capillary: 128 mg/dL — ABNORMAL HIGH (ref 70–99)
Glucose-Capillary: 114 mg/dL — ABNORMAL HIGH (ref 70–99)

## 2014-03-14 LAB — ANTI-DNA ANTIBODY, DOUBLE-STRANDED: DS DNA AB: 2 [IU]/mL

## 2014-03-14 LAB — SJOGRENS SYNDROME-A EXTRACTABLE NUCLEAR ANTIBODY: SSA (Ro) (ENA) Antibody, IgG: <1

## 2014-03-14 LAB — RENAL FUNCTION PANEL
ANION GAP: 21 — AB (ref 5–15)
Albumin: 2.7 g/dL — ABNORMAL LOW (ref 3.5–5.2)
BUN: 94 mg/dL — ABNORMAL HIGH (ref 6–23)
CALCIUM: 9.2 mg/dL (ref 8.4–10.5)
CO2: 24 meq/L (ref 19–32)
Chloride: 94 mEq/L — ABNORMAL LOW (ref 96–112)
Creatinine, Ser: 10.47 mg/dL — ABNORMAL HIGH (ref 0.50–1.10)
GFR calc Af Amer: 5 mL/min — ABNORMAL LOW (ref >90)
GFR, EST NON AFRICAN AMERICAN: 4 mL/min — AB (ref >90)
Glucose, Bld: 105 mg/dL — ABNORMAL HIGH (ref 70–99)
PHOSPHORUS: 8.3 mg/dL — AB (ref 2.3–4.6)
Potassium: 4.3 mEq/L (ref 3.7–5.3)
Sodium: 139 mEq/L (ref 137–147)

## 2014-03-14 LAB — BASIC METABOLIC PANEL
Anion gap: 21 — ABNORMAL HIGH (ref 5–15)
BUN: 93 mg/dL — AB (ref 6–23)
CHLORIDE: 96 meq/L (ref 96–112)
CO2: 25 meq/L (ref 19–32)
CREATININE: 10.22 mg/dL — AB (ref 0.50–1.10)
Calcium: 9.2 mg/dL (ref 8.4–10.5)
GFR calc Af Amer: 5 mL/min — ABNORMAL LOW (ref >90)
GFR calc non Af Amer: 4 mL/min — ABNORMAL LOW (ref >90)
GLUCOSE: 103 mg/dL — AB (ref 70–99)
Potassium: 4.4 mEq/L (ref 3.7–5.3)
Sodium: 142 mEq/L (ref 137–147)

## 2014-03-14 LAB — ANCA SCREEN W REFLEX TITER
Atypical p-ANCA Screen: NEGATIVE
C-ANCA SCREEN: NEGATIVE
P-ANCA SCREEN: NEGATIVE

## 2014-03-14 LAB — PROTIME-INR
INR: 1.15 (ref 0.00–1.49)
Prothrombin Time: 14.8 seconds (ref 11.6–15.2)

## 2014-03-14 LAB — SJOGRENS SYNDROME-B EXTRACTABLE NUCLEAR ANTIBODY: SSB (LA) (ENA) ANTIBODY, IGG: NEGATIVE

## 2014-03-14 LAB — ANA: Anti Nuclear Antibody(ANA): NEGATIVE

## 2014-03-14 LAB — APTT: aPTT: 34 seconds (ref 24–37)

## 2014-03-14 MED ORDER — HEPARIN SODIUM (PORCINE) 1000 UNIT/ML DIALYSIS
1000.0000 [IU] | INTRAMUSCULAR | Status: DC | PRN
  Filled 2014-03-14: qty 1

## 2014-03-14 MED ORDER — ALTEPLASE 2 MG IJ SOLR
2.0000 mg | Freq: Once | INTRAMUSCULAR | Status: AC | PRN
  Filled 2014-03-14: qty 2

## 2014-03-14 MED ORDER — FENTANYL CITRATE 0.05 MG/ML IJ SOLN
INTRAMUSCULAR | Status: AC
  Filled 2014-03-14: qty 2

## 2014-03-14 MED ORDER — IOHEXOL 300 MG/ML  SOLN
150.0000 mL | Freq: Once | INTRAMUSCULAR | Status: AC | PRN
  Administered 2014-03-14: 90 mL via INTRA_ARTERIAL

## 2014-03-14 MED ORDER — LIDOCAINE-PRILOCAINE 2.5-2.5 % EX CREA
1.0000 | TOPICAL_CREAM | CUTANEOUS | Status: DC | PRN
Start: 2014-03-14 — End: 2014-03-17

## 2014-03-14 MED ORDER — MIDAZOLAM HCL 2 MG/2ML IJ SOLN
INTRAMUSCULAR | Status: AC | PRN
  Administered 2014-03-14: 1 mg via INTRAVENOUS
  Administered 2014-03-14 (×2): 0.5 mg via INTRAVENOUS
  Administered 2014-03-14 (×2): 1 mg via INTRAVENOUS

## 2014-03-14 MED ORDER — NEPRO/CARBSTEADY PO LIQD
237.0000 mL | ORAL | Status: DC | PRN
  Filled 2014-03-14: qty 237

## 2014-03-14 MED ORDER — MIDAZOLAM HCL 2 MG/2ML IJ SOLN
INTRAMUSCULAR | Status: AC
  Filled 2014-03-14: qty 2

## 2014-03-14 MED ORDER — HEPARIN SODIUM (PORCINE) 1000 UNIT/ML DIALYSIS
100.0000 [IU]/kg | INTRAMUSCULAR | Status: DC | PRN
  Filled 2014-03-14: qty 10

## 2014-03-14 MED ORDER — LIDOCAINE HCL 1 % IJ SOLN
INTRAMUSCULAR | Status: AC
  Filled 2014-03-14: qty 20

## 2014-03-14 MED ORDER — MIDAZOLAM HCL 2 MG/2ML IJ SOLN
INTRAMUSCULAR | Status: DC
Start: 2014-03-14 — End: 2014-03-14
  Filled 2014-03-14: qty 2

## 2014-03-14 MED ORDER — SODIUM CHLORIDE 0.9 % IV SOLN
100.0000 mL | INTRAVENOUS | Status: DC | PRN
Start: 2014-03-14 — End: 2014-03-21

## 2014-03-14 MED ORDER — FENTANYL CITRATE 0.05 MG/ML IJ SOLN
INTRAMUSCULAR | Status: AC | PRN
  Administered 2014-03-14: 12.5 ug via INTRAVENOUS
  Administered 2014-03-14 (×2): 25 ug via INTRAVENOUS
  Administered 2014-03-14: 12.5 ug via INTRAVENOUS
  Administered 2014-03-14: 25 ug via INTRAVENOUS

## 2014-03-14 MED ORDER — SODIUM CHLORIDE 0.9 % IV SOLN
INTRAVENOUS | Status: DC
  Administered 2014-03-14 – 2014-03-20 (×4): via INTRAVENOUS

## 2014-03-14 MED ORDER — HEPARIN SOD (PORK) LOCK FLUSH 100 UNIT/ML IV SOLN
INTRAVENOUS | Status: DC
Start: 2014-03-14 — End: 2014-03-14
  Filled 2014-03-14: qty 5

## 2014-03-14 MED ORDER — HYDRALAZINE HCL 20 MG/ML IJ SOLN
INTRAMUSCULAR | Status: AC
  Filled 2014-03-14: qty 1

## 2014-03-14 MED ORDER — SODIUM CHLORIDE 0.9 % IV SOLN: 100.0000 mL | INTRAVENOUS | Status: DC | PRN

## 2014-03-14 MED ORDER — CLONIDINE HCL 0.1 MG/24HR TD PTWK: 0.1000 mg | MEDICATED_PATCH | TRANSDERMAL | Status: DC

## 2014-03-14 MED ORDER — PENTAFLUOROPROP-TETRAFLUOROETH EX AERO: 1.0000 "application " | INHALATION_SPRAY | CUTANEOUS | Status: DC | PRN

## 2014-03-14 MED ORDER — LIDOCAINE HCL (PF) 1 % IJ SOLN: 5.0000 mL | INTRAMUSCULAR | Status: DC | PRN

## 2014-03-14 MED ORDER — HEPARIN SOD (PORK) LOCK FLUSH 100 UNIT/ML IV SOLN
INTRAVENOUS | Status: AC | PRN
  Administered 2014-03-14: 1000 [IU] via INTRAVENOUS

## 2014-03-14 MED ORDER — SODIUM CHLORIDE 0.9 % IV SOLN: INTRAVENOUS | Status: DC

## 2014-03-14 NOTE — Procedures (Signed)
I was present at this session.  I have reviewed the session itself and made appropriate changes.  bp tol HD.  Cath flow 400.    Lynn Gentry L 10/27/20158:18 AM

## 2014-03-14 NOTE — Sedation Documentation (Signed)
Right groin C/D/I at this time.  Without hematoma or bleeding.  Pulses palpated +2 at pedal and post tib.

## 2014-03-14 NOTE — Progress Notes (Signed)
PULMONARY / CRITICAL CARE MEDICINE   Name: Lynn Gentry MRN: [id number] DOB: 11-25-1970    ADMISSION DATE:  02/21/2014 CONSULTATION DATE:  03/14/2014  REFERRING MD :  Pearlean BrownieSethi  CHIEF COMPLAINT:  Respiratory failure in setting of ICH  INITIAL PRESENTATION: 43 [facility]. F [facility] with left sided weakness, slurred speech, inability to stand up.  In ED, found to have acute lateral right basal ganglia ICH with mild mass effect and 5mm MLS.  Admitted to neuro ICU and developed acute respiratory failure, failed bipap and PCCM called for intubation and management. She was admitted to neuro ICU and later that evening had increasing O2 demands.  She had no improvement in SpO2, work of breathing, and mental status despite 3 hours of BiPAP.  PCCM was consulted for intubation.  STUDIES:  10/06 CT head:  acute hemorrhage into the lateral right basal ganglia, likely indicating a hypertensive hemorrhage/hemorrhagic stroke.  Mild mass effect with 5mm MLS to the left. 10/07 Renal US:  echogenic kidneys s/w ckd, no hydro.  [facility]07 Doppler carotid: consistent with 1-39% stenosis involving the R internal carotid art and the L internal carotid art 10/07 TTE: LVEF 60-65%. Severe concentric LVH. LA dilatation. PASP est 43 mmHg [facility]09 LE venous Dopplers (ordered by Stroke) >> negative 10/21 MRI / MRA brain >> stable subacute intraparenchymal hemorrhage, localized vasogenic edema, mild mass effect, 4 mm R to L shift, acute L MCA infarct, largely watershed in distribution, additional small infarcts in deep white matter  SIGNIFICANT EVENTS: 10/06  Admitted for ICH, developed respiratory failure requiring intubation. 10/07  Persistent hypertension. Remained on nicardipine gtt. Persistent volume overload snd pulm edema pattern on CXR. Lasix gtt resulted in minimal increase in Uo 10/08  Worsening renal function, oliguria. Renal consult. HD  performed 10/09  Acute change in neuro status. STAT [facility]  Further HD planned 10/14  HD with negative fluid balance 10/15  Trach (JY) 10/16  Febrile and purulent matter from nasal cavity and trach. 10/17  Transition from fentanyl gtt to Duragesic patch. Begin PSV weaning 10/18  Transfer to vent SDU bed ordered. Tolerating PSV 10 cm H2O 10/19  Transfer to SDU/2900 10/21  Off vent 36hours, but went back on in evening for secretions/tachypnea; Persistent diarrhea, d/c'd senokot, [place] explaining dismal prognosis 10/22  Mucus plugging this AM; Neurology had discussion with family explaining dismal prognosis, family desires full code 10/23- active pall care discussions 10/25- pall care meeting not much progress  SUBJ -no changes, increased HR and BP with weaning.  VITAL SIGNS: Temp:  [98.3 F (36.8 C)-99.2 F (37.3 C)] 99.2 F (37.3 C) (10/27 0745) Pulse Rate:  [58-107] 107 (10/27 1030) Resp:  [15-34] 28 (10/27 1030) BP: (99-182)/(46-115) 179/73 mmHg (10/27 1030) SpO2:  [98 %-100 %] 100 % (10/27 1030) FiO2 (%):  [40 %] 40 % (10/27 0830) Weight:  [95 kg (209 lb 7 oz)] 95 kg (209 lb 7 oz) (10/27 0400)  VENTILATOR SETTINGS: Vent Mode:  [-] PRVC FiO2 (%):  [40 %] 40 % Set Rate:  [15 bmp] 15 bmp Vt Set:  [500 mL] 500 mL PEEP:  [5 cmH20] 5 cmH20 Plateau Pressure:  [24 cmH20-27 cmH20] 24 cmH20  INTAKE / OUTPUT: Intake/Output     10/26 0701 - 10/27 0700 10/27 0701 - 10/28 0700   I.V. (mL/kg) 220 (2.3)    Other 210  NG/GT 640    IV Piggyback 110    Total Intake(mL/kg) 1180 (12.4)    Stool     Total Output       Net +1180          Urine Occurrence 1 x    Stool Occurrence 2 x     PHYSICAL EXAMINATION:  Gen: only opens eyes to voice that's it, no other responses HEENT: Trach site clean PULM: ronchi mild diffuse CV: distant tones RRR, no mgr AB: BS+, soft, nontender, PEG Ext: 2 plus  edema Neuro: opens eyes to voice, no  movement of extremities on my exam, no changes  LABS:  I have reviewed all of today's lab results. Relevant abnormalities are discussed in the A/P section  10/22 CXR > stable bilateral airspace disease  ASSESSMENT / PLAN:  PULMONARY ETT 10/6 >> 10/15 Trach tube 10/15 >>  A: Acute hypoxemic respiratory failure > nearly completely off vent support HCAP> MSSA Mucus plugging 10/22 Edema component P:   Guaifenesin Chest PT as able Begin PS trials, anticipate will be able to tolerate PS post dialysis today, weanable with time. Cont vent bundle Neg balance on HD needed, due Monday  CARDIOVASCULAR CVL R IJ 10/6 >>  A:  HTN persistent Diastolic CHF > currently mild volume overload P:  Unable to remove cvl due to poor IV access, and no PICC given renal function. Continue hydralazine, clonidine, metoprolol, norvasc, enalapril Follow BP after HD in am  PRN hydralazine to maintain SBP < 170 mmHg PRN metoprolol to maintain HR < 115/min  RENAL L IJ HD cath 10/08 >> 10/20 Perm Cath L [place] 10/20 >> A:   Acute renal failure> now ESRD  P:   kvo HD today  GASTROINTESTINAL A:   Obesity P:   SUP: Pantoprazole Cont TFs tolerated well  HEMATOLOGIC A:   ICU acquired anemia P:  DVT px: SQ heparin Monitor CBC intermittently and after neg balance hemoconcetration Nephrology considering feraheme Transfuse per usual ICU guidelines  INFECTIOUS A:   HCAP > MSSA P:   Resp 10/17 >> Abundant staph aureus MSSA Blood 10/16 >> ng C.diff 10/20 >> neg  Fluconazole 10/15 >> 10/17 Vancomycin 10/16 >> 10/22 Zosyn 10/16 >> 10/20 Ancef 10/21 >> MWF with HD plan will plan of 10 days  ENDOCRINE A:   Hyperglycemia, controlled P:   CBG's q4hr Cont SSI  NEUROLOGIC A:   Hypertensive R basal Ganglion Hemorrhage  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   TODAY'S SUMMARY: HD today for first time since last week. Angio today. No change in neurologic status.   Alyson Reedy, M.D. [facility] Pulmonary/Critical Care Medicine. Pager: [phone]. After hours pager: [phone].

## 2014-03-14 NOTE — Progress Notes (Signed)
Patient ID: Kathlee NationsStephanie Bolio, female    DOB: Aug 21, 1970, 43 y.o.   MRN: [id number] Nephrology Progress Note  S: opens eyes in response to voice  O:BP 134/71  Pulse 75  Temp(Src) 98.5 F (36.9 C) (Oral)  Resp 15  Ht 5\' 4"  (1.626 m)  Wt 209 lb 7 oz (95 kg)  BMI 35.93 kg/m2  SpO2 100%  LMP 03/01/2014  Intake/Output Summary (Last 24 hours) at 03/14/14 0756 Last data filed at 03/14/14 0700  Gross per 24 hour  Intake   1180 ml  Output      0 ml  Net   1180 ml   Intake/Output: I/O last 3 completed shifts: In: 1910 [I.V.:350; Other:330; NG/GT:1120; IV Piggyback:110] Out: -   Intake/Output this shift:    Weight change: -33 lb 15.2 oz (-15.4 kg) Gen: NAD HEENT: dilated pupils bilaterally, minimally reactive to light CVS: RRR, 2/6 holosys M Resp: transmitted upper airway sounds, Scattered rhonchi, decreased bs Abd: soft, obese, +BS Liver down 5 cm Ext: 1+ pedal edema Neuro: opens eyes to tactile and verbal stimuli. Raises left hand, moved left foot  IJ cath   Recent Labs Lab 03/08/14 0445 03/09/14 0450 03/10/14 0500 03/11/14 0345 03/12/14 0330 03/13/14 0340 03/14/14 0430  NA 140 139 137 137 139 139 139  142  K 4.4 3.9 3.9 4.2 3.8 4.1 4.3  4.4  CL 97 94* 94* 95* 94* 95* 94*  96  CO2 25 27 28 26 27 26 24  25   GLUCOSE 118* 129* 115* 109* 113* 127* 105*  103*  BUN 99* 45* 64* 38* 55* 72* 94*  93*  CREATININE 7.92* 4.82* 7.17* 5.10* 7.27* 8.63* 10.47*  10.22*  ALBUMIN 2.0* 2.3* 2.1* 2.4* 2.4* 2.5* 2.7*  CALCIUM 8.7 9.1 8.9 9.1 9.1 9.3 9.2  9.2  PHOS 8.9* 5.9* 8.0* 4.9* 6.1* 7.0* 8.3*   Liver Function Tests:  Recent Labs Lab 03/12/14 0330 03/13/14 0340 03/14/14 0430  ALBUMIN 2.4* 2.5* 2.7*   No results found for this basename: LIPASE, AMYLASE,  in the last 168 hours No results found for this basename: AMMONIA,  in the last 168 hours CBC:  Recent Labs Lab 03/08/14 0445 03/11/14 0345 03/12/14 0735 03/13/14 0340 03/14/14 0430  WBC 10.1 13.1* 8.9 9.4  9.5  NEUTROABS 7.2  --   --   --   --   HGB 7.6* 8.9* 7.6* 8.6* 8.7*  HCT 24.4* 29.2* 24.8* 27.9* 28.1*  MCV 91.0 93.9 91.5 91.2 91.5  PLT 284 379 342 401* 398   Cardiac Enzymes: No results found for this basename: CKTOTAL, CKMB, CKMBINDEX, TROPONINI,  in the last 168 hours CBG:  Recent Labs Lab 03/13/14 1122 03/13/14 1651 03/13/14 2042 03/13/14 2329 03/14/14 0453  GLUCAP 162* 118* 134* 128* 117*    Iron Studies: No results found for this basename: IRON, TIBC, TRANSFERRIN, FERRITIN,  in the last 72 hours Studies/Results: Dg Chest Port 1 View  03/13/2014   CLINICAL DATA:  Subsequent evaluation of pneumonia  EXAM: PORTABLE CHEST - 1 VIEW  COMPARISON:  03/09/2014  FINDINGS: Support apparatus is unchanged. Very limited inspiratory effect. At least mild cardiac silhouette enlargement, stable. No consolidation appreciated on either side. Vascular congestion again noted.  IMPRESSION: Again very limited study due to limited inspiratory effect with evidence of cardiac silhouette enlargement and vascular congestion.   Electronically Signed   By: Esperanza Heiraymond  Rubner M.D.   On: 03/13/2014 07:54   . amLODipine  10 mg Per Tube QHS  .  antiseptic oral rinse  7 mL Mouth Rinse QID  . chlorhexidine  15 mL Mouth Rinse BID  . cloNIDine  0.3 mg Transdermal Q Mon  . darbepoetin (ARANESP) injection - DIALYSIS  100 mcg Intravenous Q Mon-HD  . enalapril  5 mg Per Tube Daily  . feeding supplement (NEPRO CARB STEADY)  1,000 mL Per Tube Q24H  . feeding supplement (PRO-STAT SUGAR FREE 64)  30 mL Oral 5 X Daily  . ferric gluconate (FERRLECIT/NULECIT) IV  125 mg Intravenous Q M,W,F-HD  . guaiFENesin  10 mL Per Tube TID  . heparin subcutaneous  5,000 Units Subcutaneous 3 times per day  . hydrALAZINE  100 mg Per Tube 4 times per day  . insulin aspart  0-15 Units Subcutaneous 6 times per day  . metoprolol tartrate  50 mg Per Tube BID  . multivitamin  5 mL Per Tube Daily  . pantoprazole sodium  40 mg Per Tube  Q1200  . scopolamine  1 patch Transdermal Q72H    BMET    Component Value Date/Time   NA 139 03/14/2014 0430   NA 142 03/14/2014 0430   K 4.3 03/14/2014 0430   K 4.4 03/14/2014 0430   CL 94* 03/14/2014 0430   CL 96 03/14/2014 0430   CO2 24 03/14/2014 0430   CO2 25 03/14/2014 0430   GLUCOSE 105* 03/14/2014 0430   GLUCOSE 103* 03/14/2014 0430   BUN 94* 03/14/2014 0430   BUN 93* 03/14/2014 0430   CREATININE 10.47* 03/14/2014 0430   CREATININE 10.22* 03/14/2014 0430   CALCIUM 9.2 03/14/2014 0430   CALCIUM 9.2 03/14/2014 0430   GFRNONAA 4* 03/14/2014 0430   GFRNONAA 4* 03/14/2014 0430   GFRAA 5* 03/14/2014 0430   GFRAA 5* 03/14/2014 0430   CBC    Component Value Date/Time   WBC 9.5 03/14/2014 0430   RBC 3.07* 03/14/2014 0430   HGB 8.7* 03/14/2014 0430   HCT 28.1* 03/14/2014 0430   PLT 398 03/14/2014 0430   MCV 91.5 03/14/2014 0430   MCH 28.3 03/14/2014 0430   MCHC 31.0 03/14/2014 0430   RDW 19.8* 03/14/2014 0430   LYMPHSABS 1.3 03/08/2014 0445   MONOABS 1.3* 03/08/2014 0445   EOSABS 0.2 03/08/2014 0445   BASOSABS 0.1 03/08/2014 0445   Assessment/Plan: 43 y.o. female with no significant known PMH admitted for right basal ganglia hemorrhage, underwent intubation for declining respiratory status and oliguric, started on HD.   1. AKI vs. Subacute vs. AKI/CKD- oliguric requiring dialysis. S/p HD treatments with marked improvement of volume and BP  1. Cont with HD MWF. 2. Remains oliguric and dialysis-dependent  3. 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 HD today (didn't received yesterday). UF goal 4.5L Will repeat in am 2. Vascular access: TDC placed left IJ 10/20. Will use this week then get PC 3. Anemia: on aranesp. Iron <10, ferritin 76. 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 Elevated phos. Treat phos, indic of chronicity of illness 5. Neuro- MRI 10/21 with stable hematoma lentiform nucleas, acute left MCA infarct, small infarcts right frontal, parieto-occiptal,  left pons. Serology labs drawn (neg so far). Planned cath angio today minimal improvement 6. Malignant HTN: on amlodipine, enalapril, hydralazine. Will lower Catapres, and stop Enalapril as control improving with lower vol  [name] I have seen and examined this patient and agree with the plan of care seen, eval, examined, and discussed. Eval after angio, feel is low yield. .  Sylvana Bonk L 03/14/2014, 1:27 PM  7:56  AM

## 2014-03-14 NOTE — Progress Notes (Signed)
STROKE TEAM PROGRESS NOTE   HISTORY Lynn Gentry is an 43 y.o. female with no previously documented medical disorder who was brought to [facility] with new onset slurred speech and left-sided weakness. Patient went to work as usual until 1500. She was found by her son at home at 1600 unable to get up out of a chair with left sided weakness, slurred speech. Patient was last known well around 3 PM this afternoon 02/21/2014. CT scan of her head showed an acute 4.1 x 1.5 cm right basal ganglia hemorrhage with mild mass effect and right to left shift of 5 mm. Patient's blood pressure was markedly elevated at 226/111. She was started on Cardene drip and blood pressure responded well. She has shown continual respiratory difficulty. She was initially given oxygen by nasal cannula followed by NBR, and subsequently placed on BiPAP. She continued to have oxygen saturations only in the high 80s to low 90s on 100% O2, with a respiratory rates in the 40s. ABG showed pH of 2.82, PCO2 37 PO2 of 57. Chest x-ray showed mild cardiomegaly with possible asymmetric pulmonary edema. BNP was 36,455. Because of deteriorating respiratory status intubation and mechanical ventilation was elected. CCM service was consulted. Patient was not administered TPA secondary to ICH. She was admitted to the neuro ICU  for further evaluation and treatment.   SUBJECTIVE (INTERVAL HISTORY) Family at bedside. Updated them regarding cerebral angio findings. Pt also had HD today before angio. Clinically no significant change.  OBJECTIVE Temp:  [98.3 F (36.8 C)-100.1 F (37.8 C)] 100.1 F (37.8 C) (10/27 2028) Pulse Rate:  [54-107] 102 (10/27 2106) Cardiac Rhythm:  [-] Sinus bradycardia (10/27 1458) Resp:  [15-28] 18 (10/27 2100) BP: (103-182)/(46-113) 151/70 mmHg (10/27 2106) SpO2:  [99 %-100 %] 100 % (10/27 2100) FiO2 (%):  [40 %] 40 % (10/27 2028) Weight:  [209 lb 7 oz (95 kg)] 209 lb 7 oz (95 kg) (10/27 0400)   Recent  Labs Lab 03/14/14 0453 03/14/14 0731 03/14/14 1136 03/14/14 1708 03/14/14 2045  GLUCAP 117* 113* 103* 116* 114*    Recent Labs Lab 03/10/14 0500 03/11/14 0345 03/12/14 0330 03/13/14 0340 03/14/14 0430  NA 137 137 139 139 139  142  K 3.9 4.2 3.8 4.1 4.3  4.4  CL 94* 95* 94* 95* 94*  96  CO2 28 26 27 26 24  25   GLUCOSE 115* 109* 113* 127* 105*  103*  BUN 64* 38* 55* 72* 94*  93*  CREATININE 7.17* 5.10* 7.27* 8.63* 10.47*  10.22*  CALCIUM 8.9 9.1 9.1 9.3 9.2  9.2  PHOS 8.0* 4.9* 6.1* 7.0* 8.3*    Recent Labs Lab 03/10/14 0500 03/11/14 0345 03/12/14 0330 03/13/14 0340 03/14/14 0430  ALBUMIN 2.1* 2.4* 2.4* 2.5* 2.7*    Recent Labs Lab 03/08/14 0445 03/11/14 0345 03/12/14 0735 03/13/14 0340 03/14/14 0430  WBC 10.1 13.1* 8.9 9.4 9.5  NEUTROABS 7.2  --   --   --   --   HGB 7.6* 8.9* 7.6* 8.6* 8.7*  HCT 24.4* 29.2* 24.8* 27.9* 28.1*  MCV 91.0 93.9 91.5 91.2 91.5  PLT 284 379 342 401* 398   No results found for this basename: CKTOTAL, CKMB, CKMBINDEX, TROPONINI,  in the last 168 hours  Recent Labs  03/14/14 0430  LABPROT 14.8  INR 1.15   No results found for this basename: COLORURINE, APPERANCEUR, LABSPEC, PHURINE, GLUCOSEU, HGBUR, BILIRUBINUR, KETONESUR, PROTEINUR, UROBILINOGEN, NITRITE, LEUKOCYTESUR,  in the last 72 hours  Component Value Date/Time   CHOL 177 02/28/2014 0350   Lab Results  Component Value Date   HGBA1C 5.8* 02/23/2014   No results found for this basename: labopia,  cocainscrnur,  labbenz,  amphetmu,  thcu,  labbarb    No results found for this basename: ETH,  in the last 168 hours  I have personally reviewed the radiological images below and agree with the radiology interpretations.  Ct Head Wo Contrast 02/28/14 - Stable 4.2 x 1.1 cm right lenticular nucleus hematoma with mild  surrounding vasogenic edema. Local mass effect upon the right  lateral ventricle with slight bowing of the septum to the left by  3.9 mm without  significant change.  This may represent result of hypertensive hemorrhage and can be  assessed as this clears.  Prominent white matter type changes asymmetric greater on the left  more conspicuous on the present exam than on the prior exam. The  patient may benefit from followup MR and MR angiogram for further  delineation. This may be ischemic in origin possibly related to  watershed type infarct. Other causes not excluded although felt to  be less likely.  02/24/2014 Stable appearance of the hematoma within the right basal ganglia. No new or progressive findings.  02/22/2014   1. No significant interval change in the patient's intraparenchymal hemorrhage at the right lateral basal ganglia, measuring 4.1 x 1.2 cm, with mild surrounding vasogenic edema. Mild associated mass effect, with 4 mm of leftward midline shift again seen. 2. Scattered small vessel ischemic microangiopathy. 3. Bilateral proptosis noted.     02/21/2014   1. Acute hemorrhage into the lateral right basal ganglia, likely indicating a hypertensive hemorrhage/hemorrhagic stroke. 2. Mild mass effect with shift of the midline to the left approximating 5 mm. Critical   US Renal Port 02/22/2014    1. Small echogenic kidneys consistent with chronic renal medical disease. No hydronephrosis. 2. The urinary bladder is decompressed by Foley catheter and cannot be evaluated.     Dg Chest Port 1 View 02/22/2014    1. Satisfactory positioning of endotracheal tube and right IJ line. 2. Cardiomegaly with persistent diffuse interstitial and airspace disease likely reflecting edema and congestive heart failure. 3. Bilateral pleural effusions and basilar airspace disease. This likely reflects atelectasis.    02/21/2014    Mild cardiac enlargement and possible asymmetric pulmonary edema. However, exam is limited by body habitus.     2D echo - - Left ventricle: The cavity size was normal. There was severe concentric hypertrophy. Systolic function was  normal. The estimated ejection fraction was in the range of 60% to 65%. Wall motion was normal; there were no regional wall motion abnormalities. - Left atrium: The atrium was mildly dilated. - Pulmonary arteries: PA peak pressure: 43 mm Hg (S). - Pericardium, extracardiac: A small, free-flowing pericardial effusion was identified posterior to the heart and circumferential to the heart. The fluid had no internal echoes.There was no evidence of hemodynamic compromise. Impressions: - The right ventricular systolic pressure was increased consistent with moderate pulmonary hypertension.  CUS - - Findings consistent with 1-39 percent stenosis involving the right internal carotid artery and the left internal carotid artery. - Right vertebral artery not imaged due to central line dressing. Left vertebral artery not imaged due to patient position.  DVT - - No obvious evidence of deep vein or superficial thrombosis involving the right lower extremity and left lower extremity. - No evidence of Baker&'s cyst on the right or left.  EEG 02/28/14 - This EEG is abnormal with severe generalized slowing of cerebral activity consistent with severe encephalopathic state. These findings are nonspecific and can be seen with metabolic as well as toxic and degenerative encephalopathies. No evidence of an epileptic disorder was demonstrated.  MRI Brain 03/08/2014 1. Stable size and morphology of 4.5 x 1.4 x 2.8 cm subacute  intraparenchymal hematoma involving the right lentiform nucleus with  associated vasogenic edema and mass effect on the right lateral  ventricle. There is persistent 4 mm of right-to-left midline shift.  2. Acute left MCA territory infarct involving the deep white matter  of the left frontoparietal lobes. This is largely watershed in  distribution.  3. Additional small infarcts within the deep white matter of the  right frontal lobe, right parieto-occipital region, and left pons.    MRA HEAD 03/08/14 1. Multi focal irregularity with associated stenoses within the  supra clinoid left ICA, left M1 segment, and possibly left A1  segment as above. Possible vasculitis or drug exposure could be  considered  4 vessel cerebral angiogram 1.Occluded RT ACA A1prox.  2.Severe focal stenosis of LT MCA M1 prox to trifurcation.  3. Approx 50 % stenosis LT ICA suprclinoid seg.  4. Approx 30 to 50 % stenosis Rt I CA supraclnoid seg.  5. 30 to 50 % stenosis RT MCA prox..   A1C 5.8 and LDL 92  PHYSICAL EXAM Young obese Caucasian lady not in distress. On trach. Afebrile. Head is nontraumatic. Neck is supple without bruit. Cardiac exam soft ejection murmur RRR .no gallop. Lungs bilateral crackles and  rhonchi to auscultation. Distal pulses are well felt.   Neurological Exam: still on ventilation with trach, eyes open on voice but not tracking and not following commands.Aphasic Eyes in the right side gaze position. Pupils 3 mm reactive. Fundi not visualized. Left lower face weakness, corneal and gag present. Spontaneously moving LUE and Withdraw to LLE on pain stimulation. Not moving RUE and trace withdraw to RLE. RUE spastic on the flexed position and RLE also increased muscle tone. Left Plantar upgoing and right down going.  ASSESSMENT/PLAN Lynn Gentry is a 43 y.o. female with no documented medical history found with new onset slurred speech and left-sided weakness. CT showed right external capsule hemorrhage. However, her condition getting worse with pulmonary edema, AKI requiring HD, respiratory failure needing ventilation. Transferred pt to CCM for better management. Days after admission, pt was found to not moving on the right, CT at that time no change but later repeat CT showed left subcortical hypodensity. MRI not able to perform initially due to not able to lie flat. But recently done showed extensive left subcortical stroke as well as punctate right frontal deep WM and  right parietoccipital region as well as left pons.   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 Will complete some peripheral markers for systemic vasculitis, so far negative. Due to her general neurological and medical condition she is not a candidate at the present time for brain biopsy for definitive diagnosis of CNS vasculitis.Plan to check  urine drug screen but patient is anuric hence unable to do so.  Cerebral angiogram did not show pattern of vasculitis.  EEG showed severe slowing but no seizure.  2D echo unremarkable  Carotid doppler unremarkable  HgbA1c 5.8  Heparin subq for VTE prophylaxis  Resultant VDRF, aphasia dense right hemiplegia with spasticity and mild left hemiparesis  no antithrombotics prior to admission, currently not on antithrombotics due to hemorrhage.  Ongoing aggressive risk factor management  Acute Respiratory Failure with severe acute pulmonary edema  trach on 03/02/14  CCM following  Diuresing, IV Lasix, Hemodialysis, urine output minimal today  CXR series  Malignant Hypertension  BP 226/111 at onset  Home meds:   none  SBP goal < 150 due to presence of end organ damage  On amlodipine, clonidine patch and metoprolol and hydralazine and enalapril  off cardene   BP stabilized  Acute renal failure with volume overload, suspect baseline chronic kidney disease  Diuresis and management per critical care, on HD  Continue to trend Cre.  HD as per  nephrology  Other Stroke Risk Factors ETOH use   Morbid Obesity, Body mass index is 35.93 kg/(m^2).   Hospital day # 21 03/14/2014 10:35 PM   Marvel PlanJindong Md Smola, MD PhD Stroke Neurology 03/14/2014 10:35 PM   To contact Stroke Continuity provider, please refer to [email or web address]. After hours, contact General Neurology

## 2014-03-14 NOTE — Sedation Documentation (Signed)
Bilateral pedal and post tib pulses +2 upon palpation.

## 2014-03-14 NOTE — Procedures (Signed)
S/P 4 vessel cerebral arteriogram. RT CFA approach. Fidings. 1.Occluded RT ACA A1prox. 2.Severe focal stenosis of LT MCA M1 prox to trifurcation. 3. Approx 50 % stenosis LT ICA suprclinoid seg. 4. Approx 30 to 50 % stenosis Rt I CA supraclnoid seg. 5. 30 to 50 % stenosis RT MCA prox.. 7. NonOpacification of LT sig sinus and Lt int jugular vein ,with severe stenosis versus occlusion of LT transverse sinus

## 2014-03-15 DIAGNOSIS — N185 Chronic kidney disease, stage 5: Secondary | ICD-10-CM

## 2014-03-15 LAB — RENAL FUNCTION PANEL
Albumin: 2.8 g/dL — ABNORMAL LOW (ref 3.5–5.2)
Anion gap: 18 — ABNORMAL HIGH (ref 5–15)
BUN: 46 mg/dL — ABNORMAL HIGH (ref 6–23)
CHLORIDE: 96 meq/L (ref 96–112)
CO2: 26 mEq/L (ref 19–32)
Calcium: 9.2 mg/dL (ref 8.4–10.5)
Creatinine, Ser: 6.14 mg/dL — ABNORMAL HIGH (ref 0.50–1.10)
GFR, EST AFRICAN AMERICAN: 9 mL/min — AB (ref >90)
GFR, EST NON AFRICAN AMERICAN: 8 mL/min — AB (ref >90)
Glucose, Bld: 115 mg/dL — ABNORMAL HIGH (ref 70–99)
POTASSIUM: 4.1 meq/L (ref 3.7–5.3)
Phosphorus: 5.6 mg/dL — ABNORMAL HIGH (ref 2.3–4.6)
Sodium: 140 mEq/L (ref 137–147)

## 2014-03-15 LAB — CBC
HCT: 30.7 % — ABNORMAL LOW (ref 36.0–46.0)
Hemoglobin: 9.3 g/dL — ABNORMAL LOW (ref 12.0–15.0)
MCH: 28.4 pg (ref 26.0–34.0)
MCHC: 30.3 g/dL (ref 30.0–36.0)
MCV: 93.6 fL (ref 78.0–100.0)
Platelets: 346 10*3/uL (ref 150–400)
RBC: 3.28 MIL/uL — AB (ref 3.87–5.11)
RDW: 20.2 % — AB (ref 11.5–15.5)
WBC: 8.6 10*3/uL (ref 4.0–10.5)

## 2014-03-15 LAB — GLUCOSE, CAPILLARY
GLUCOSE-CAPILLARY: 104 mg/dL — AB (ref 70–99)
GLUCOSE-CAPILLARY: 120 mg/dL — AB (ref 70–99)
Glucose-Capillary: 106 mg/dL — ABNORMAL HIGH (ref 70–99)
Glucose-Capillary: 106 mg/dL — ABNORMAL HIGH (ref 70–99)
Glucose-Capillary: 108 mg/dL — ABNORMAL HIGH (ref 70–99)
Glucose-Capillary: 113 mg/dL — ABNORMAL HIGH (ref 70–99)

## 2014-03-15 LAB — MAGNESIUM: Magnesium: 2.3 mg/dL (ref 1.5–2.5)

## 2014-03-15 MED ORDER — WHITE PETROLATUM GEL
Status: AC
  Administered 2014-03-15: 0.2
  Filled 2014-03-15: qty 5

## 2014-03-15 NOTE — Progress Notes (Signed)
Clinical Social Work Department BRIEF PSYCHOSOCIAL ASSESSMENT 03/15/2014  Patient:  Kathlee NationsJOHNSON,Parris     Account Number:  [id number]     Admit date:  02/21/2014  Clinical Social Worker:  Harless NakayamaAMBELAL,Song Myre, LCSWA  Date/Time:  03/15/2014 02:00 PM  Referred by:  Physician  Date Referred:  03/15/2014 Referred for  SNF Placement   Other Referral:   Interview type:  Family Other interview type:   Spoke with pt husband and mother    PSYCHOSOCIAL DATA Living Status:  HUSBAND Admitted from facility:   Level of care:   Primary support name:  Joni FearsRichard Tabb Primary support relationship to patient:  SPOUSE Degree of support available:   Pt has strong family support    CURRENT CONCERNS Current Concerns  Post-Acute Placement   Other Concerns:    SOCIAL WORK ASSESSMENT / PLAN [facility] husband has been notiifed that pt insurance denied LTACH. CS[facility]. Pt husband and pt mother asked CSW to speak on the side. CSW sat with pt husband and mother in [place] area and discussed pt options. CSW informed them that since pt insurance has denied LTACH we would need to proceed with looking at SNF. CSW informed family that CSW would send referral to all [facility] SNFs but to CSW knowledge there are only one or two facilities that would be able to handle pt care and [place]. Pt husband informed CSW that this would not be an option. Pt and pt husband have a high school age son who pt husband is currently caring for on his own. Pt husband informed CSW he would not be able to manage having [place] in [place] or other state and taking care of his son. CSW offered support and understanding. CSW did inform him that unfortnetly this is not an insurance issue at this time, and if pt remains in the same condition and family continues to want to pursue aggressive care a facility in [place] may be the only faicility available. Pt husband considered  this further but informed CSW he still did not want CSW to send out referral at this time. CSW did not press dicussion any further and informed pt husband and mother that CSW would remain involved and would continue to be available for support. CSW did inform family to notify CSW if/when they are agreeable to [facility]. Pt husband thankful for this and asked CSW to give them time to think. Pt husband and mother did also ask questions about potential for LTACH to remain an option if pt has a long term insurance. CSW informed them that CSW unaware of LTACH and insurance coverage but CSW would ask RNCM to speak with family about this. CSW called RNCM and notified of conversation.    CSW to meet with family again and readdress vent SNF tomorrow or later this week. Could be beneficial if MD would speak with family about complexity of pt needs and how no facility in the area can manage pt. Per conversation today, CSW unsure if family will agree for referral to even ben sent to facilities outside of the area to consider.   Assessment/plan status:  Psychosocial Support/Ongoing Assessment of Needs Other assessment/ plan:   Information/referral to community resources:   Vent SNF facility information    PATIENT'S/FAMILY'S RESPONSE TO PLAN OF CARE: Pt family is not willing to consider vent SNFs outside of the area at this time. Family is  aware that there is no facility available locally that can manage pt needs.       Kaydence Baba, LCSWA [phone]

## 2014-03-15 NOTE — Progress Notes (Signed)
Attempted to wean patient on CPAP/PSV of 5/5.  Patient had increased respiratory rate to mid 40s.  Will attempt again after dialysis treatment.  RT will continue to monitor.

## 2014-03-15 NOTE — Progress Notes (Signed)
PULMONARY / CRITICAL CARE MEDICINE   Name: Lynn Gentry MRN: [id number] DOB: 06/01/1970    ADMISSION DATE:  02/21/2014 CONSULTATION DATE:  03/15/2014  REFERRING MD :  Pearlean BrownieSethi  CHIEF COMPLAINT:  Respiratory failure in setting of ICH  INITIAL PRESENTATION: 43 y.o. F [facility] with left sided weakness, slurred speech, inability to stand up.  In ED, found to have acute lateral right basal ganglia ICH with mild mass effect and 5mm MLS.  Admitted to neuro ICU and developed acute respiratory failure, failed bipap and PCCM called for intubation and management. She was admitted to neuro ICU and later that evening had increasing O2 demands.  She had no improvement in SpO2, work of breathing, and mental status despite 3 hours of BiPAP.  PCCM was consulted for intubation.  STUDIES:  10/06 CT head:  acute hemorrhage into the lateral right basal ganglia, likely indicating a hypertensive hemorrhage/hemorrhagic stroke.  Mild mass effect with 5mm MLS to the left. 10/07 Renal US:  echogenic kidneys s/w ckd, no hydro.  [facility] 10[facility]07 Doppler carotid: consistent with 1-39% stenosis involving the R internal carotid art and the L internal carotid art 10/07 TTE: LVEF 60-65%. Severe concentric LVH. LA dilatation. PASP est 43 mmHg 10[facility] LE venous Dopplers (ordered by Stroke) >> negative 10/21 MRI / MRA brain >> stable subacute intraparenchymal hemorrhage, localized vasogenic edema, mild mass effect, 4 mm R to L shift, acute L MCA infarct, largely watershed in distribution, additional small infarcts in deep white matter  SIGNIFICANT EVENTS: 10/06  Admitted for ICH, developed respiratory failure requiring intubation. 10/07  Persistent hypertension. Remained on nicardipine gtt. Persistent volume overload snd pulm edema pattern on CXR. Lasix gtt resulted in minimal increase in Uo 10/08  Worsening renal function, oliguria. Renal consult. HD  performed 10/09  Acute change in neuro status. STAT CT [facility]  Further HD planned 10/14  HD with negative fluid balance 10/15  Trach (JY) 10/16  Febrile and purulent matter from nasal cavity and trach. 10/17  Transition from fentanyl gtt to Duragesic patch. Begin PSV weaning 10/18  Transfer to vent SDU bed ordered. Tolerating PSV 10 cm H2O 10/19  Transfer to SDU/2900 10/21  Off vent 36hours, but went back on in evening for secretions/tachypnea; Persistent diarrhea, d/c'd senokot, Family conversation from AnzaMcQuaid explaining dismal prognosis 10/22  Mucus plugging this AM; Neurology had discussion with family explaining dismal prognosis, family desires full code 10/23- active pall care discussions 10/25- pall care meeting not much progress  SUBJ -no changes, increased HR and BP with weaning.  VITAL SIGNS: Temp:  [98.3 F (36.8 C)-100.1 F (37.8 C)] 98.4 F (36.9 C) (10/28 0715) Pulse Rate:  [54-112] 112 (10/28 0800) Resp:  [15-28] 27 (10/28 0800) BP: (107-182)/(48-113) 167/71 mmHg (10/28 0800) SpO2:  [99 %-100 %] 100 % (10/28 0730) FiO2 (%):  [40 %] 40 % (10/28 0731) Weight:  [105.8 kg (233 lb 4 oz)-108 kg (238 lb 1.6 oz)] 108 kg (238 lb 1.6 oz) (10/28 0715)  VENTILATOR SETTINGS: Vent Mode:  [-] PRVC FiO2 (%):  [40 %] 40 % Set Rate:  [15 bmp] 15 bmp Vt Set:  [500 mL] 500 mL PEEP:  [5 cmH20] 5 cmH20 Plateau Pressure:  [20 cmH20-28 cmH20] 28 cmH20  INTAKE / OUTPUT: Intake/Output     10/27 0701 - 10/28 0700 10/28 0701 - 10/29 0700   I.V. (mL/kg) 915 (8.6)  Other 180    NG/GT 240    IV Piggyback     Total Intake(mL/kg) 1335 (12.6)    Other 4514    Total Output 4514     Net -3179          Urine Occurrence 2 x     PHYSICAL EXAMINATION:  Gen: only opens eyes to voice that's it, no other responses HEENT: Trach site clean PULM: ronchi mild diffuse CV: distant tones RRR, no mgr AB: BS+, soft, nontender, PEG Ext: 2 plus  edema Neuro: opens eyes to voice,  no movement of extremities on my exam, no changes  LABS:  I have reviewed all of today's lab results. Relevant abnormalities are discussed in the A/P section  10/22 CXR > stable bilateral airspace disease  ASSESSMENT / PLAN:  PULMONARY ETT 10/6 >> 10/15 Trach tube 10/15 >>  A: Acute hypoxemic respiratory failure > nearly completely off vent support HCAP> MSSA Mucus plugging 10/22 Edema component P:   Guaifenesin Chest PT as able Now that dialyzed will reattempt PS trials, anticipate as volume status improves then will wean. Cont vent bundle Neg balance on HD needed, due Monday  CARDIOVASCULAR CVL R IJ 10/6 >>  A:  HTN persistent Diastolic CHF > currently mild volume overload P:  Unable to remove cvl due to poor IV access, and no PICC given renal function. Continue hydralazine, clonidine, metoprolol, norvasc, enalapril Follow BP after HD in am  PRN hydralazine to maintain SBP < 170 mmHg PRN metoprolol to maintain HR < 115/min  RENAL L IJ HD cath 10/08 >> 10/20 Perm Cath L [place] 10/20 >> A:   Acute renal failure> now ESRD  P:   kvo HD today  GASTROINTESTINAL A:   Obesity P:   SUP: Pantoprazole Cont TFs tolerated well  HEMATOLOGIC A:   ICU acquired anemia P:  DVT px: SQ heparin Monitor CBC intermittently and after neg balance hemoconcetration Nephrology considering feraheme Transfuse per usual ICU guidelines  INFECTIOUS A:   HCAP > MSSA P:   Resp 10/17 >> Abundant staph aureus MSSA Blood 10/16 >> ng C.diff 10/20 >> neg  Fluconazole 10/15 >> 10/17 Vancomycin 10/16 >> 10/22 Zosyn 10/16 >> 10/20 Ancef 10/21 >> MWF with HD plan will plan of 10 days  ENDOCRINE A:   Hyperglycemia, controlled P:   CBG's q4hr Cont SSI  NEUROLOGIC A:   Hypertensive R basal Ganglion Hemorrhage  Acute L MCA stroke 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 Cont PRN fentanyl  RASS goal: 0 D/w pall care, family NOT realistic at all  FAMILY: 10/28 no family bedside.   TODAY'S SUMMARY: HD per renal.  Further recommendations per neuro.  Awaiting placement efforts.   Alyson Reedy, M.D. [facility] Pulmonary/Critical Care Medicine. Pager: [phone]. After hours pager: [phone].

## 2014-03-15 NOTE — Progress Notes (Signed)
Subjective: Interval History: has no complaint,nonresponsive.  Objective: Vital signs in last 24 hours: Temp:  [98.3 F (36.8 C)-100.1 F (37.8 C)] 99.3 F (37.4 C) (10/28 1100) Pulse Rate:  [54-119] 83 (10/28 1120) Resp:  [15-31] 26 (10/28 1120) BP: (116-194)/(44-90) 152/67 mmHg (10/28 1120) SpO2:  [99 %-100 %] 100 % (10/28 1120) FiO2 (%):  [40 %] 40 % (10/28 1120) Weight:  [105.8 kg (233 lb 4 oz)-108 kg (238 lb 1.6 oz)] 106.4 kg (234 lb 9.1 oz) (10/28 1100) Weight change: 10.8 kg (23 lb 13 oz)  Intake/Output from previous day: 10/27 0701 - 10/28 0700 In: 1335 [I.V.:915; NG/GT:240] Out: 4514  Intake/Output this shift: Total I/O In: -  Out: 2000 [Other:2000]  General appearance: nonresponsive Resp: diminished breath sounds bilaterally and rhonchi bilaterally Cardio: S1, S2 normal and systolic murmur: systolic ejection 2/6, decrescendo at 2nd left intercostal space GI: obese, pos bs, liver down 7 cm Extremities: edema 1+ and ^^tone on R, moves L arm  Lab Results:  Recent Labs  03/14/14 0430 03/15/14 0405  WBC 9.5 8.6  HGB 8.7* 9.3*  HCT 28.1* 30.7*  PLT 398 346   BMET:  Recent Labs  03/14/14 0430 03/15/14 0405  NA 139  142 140  K 4.3  4.4 4.1  CL 94*  96 96  CO2 24  25 26   GLUCOSE 105*  103* 115*  BUN 94*  93* 46*  CREATININE 10.47*  10.22* 6.14*  CALCIUM 9.2  9.2 9.2   No results found for this basename: PTH,  in the last 72 hours Iron Studies: No results found for this basename: IRON, TIBC, TRANSFERRIN, FERRITIN,  in the last 72 hours  Studies/Results: No results found.  I have reviewed the patient's current medications.  Assessment/Plan: 1 ESRD  New. Will try to get [facility] Fri via [facility] 2 Anemia epo, fe 3 HPH vit D 4 CVA per neuro , not good outlook 5 obesity P HD MWF, get PC. Epo, vit D NOT CANDIDATE for LONG TERM DIALYSIS HERE    LOS: 22 days   Lynn Gentry L 03/15/2014,12:00 PM

## 2014-03-15 NOTE — Consult Note (Signed)
Referred by:  Dr. Darrick Penna  Reason for referral: New access  History of Present Illness  Lynn Gentry is a 43 y.o. (09/05/1970) female s/p large R basal ganglia stroke who presents for evaluation for permanent access.  No history is available from the patient as she is intubated.  Pt has never had access placed.  She has never had PPM.  Pt has already undergone trach and PEG placement.  Past Medical History  R CVA Chronic kidney disease stage V  Hypertension  Past Surgical History  Procedure Laterality Date  . Peg placement N/A 03/02/2014    Procedure: PERCUTANEOUS ENDOSCOPIC GASTROSTOMY (PEG) PLACEMENT;  Surgeon: Violeta Gelinas, MD;  Location: [facility] ENDOSCOPY;  Service: General;  Laterality: N/A;    History   Social History  . Marital Status: Married    Spouse Name: N/A    Number of Children: N/A  . Years of Education: N/A   Occupational History  . Not on file.   Social History Main Topics  . Smoking status: Never Smoker   . Smokeless tobacco: Not on file  . Alcohol Use: Yes  . Drug Use: No  . Sexual Activity: Not on file   Other Topics Concern  . Not on file   Social History Narrative  . No narrative on file    Family History: cannot be obtained   Current Facility-Administered Medications  Medication Dose Route Frequency Provider Last Rate Last Dose  . 0.9 %  sodium chloride infusion  100 mL Intravenous PRN Nani Ravens, MD      . 0.9 %  sodium chloride infusion   Intravenous Continuous Alyson Reedy, MD 10 mL/hr at 03/14/14 1600    . 0.9 %  sodium chloride infusion  100 mL Intravenous PRN Llana Aliment Deterding, MD      . 0.9 %  sodium chloride infusion  100 mL Intravenous PRN Trevor Iha, MD      . acetaminophen (TYLENOL) tablet 650 mg  650 mg Oral Q6H PRN Kinnie Scales Rinehuls, PA-C   650 mg at 03/14/14 2106  . amLODipine (NORVASC) tablet 10 mg  10 mg Per Tube QHS Trevor Iha, MD   10 mg at 03/14/14 2106  . antiseptic oral rinse (CPC /  CETYLPYRIDINIUM CHLORIDE 0.05%) solution 7 mL  7 mL Mouth Rinse QID Delia Heady, MD   7 mL at 03/15/14 1200  . chlorhexidine (PERIDEX) 0.12 % solution 15 mL  15 mL Mouth Rinse BID Delia Heady, MD   15 mL at 03/15/14 0925  . darbepoetin (ARANESP) injection 100 mcg  100 mcg Intravenous Q Mon-HD Dyke Maes, MD   100 mcg at 03/13/14 1709  . feeding supplement (NEPRO CARB STEADY) liquid 1,000 mL  1,000 mL Per Tube Q24H Jenifer A Williams, RD 20 mL/hr at 03/14/14 1600 1,000 mL at 03/14/14 1600  . feeding supplement (NEPRO CARB STEADY) liquid 237 mL  237 mL Oral PRN Trevor Iha, MD 20 mL/hr at 03/14/14 2000 237 mL at 03/14/14 2000  . fentaNYL (SUBLIMAZE) injection 25-100 mcg  25-100 mcg Intravenous Q2H PRN Merwyn Katos, MD   100 mcg at 03/15/14 1239  . ferric gluconate (NULECIT) 125 mg in sodium chloride 0.9 % 100 mL IVPB  125 mg Intravenous Q M,W,F-HD Trevor Iha, MD   125 mg at 03/15/14 0948  . guaiFENesin (ROBITUSSIN) 100 MG/5ML solution 200 mg  10 mL Per Tube TID Lupita Leash, MD   200 mg at  03/15/14 0925  . heparin injection 1,000 Units  1,000 Units Dialysis PRN Trevor IhaJames L Deterding, MD      . heparin injection 5,000 Units  5,000 Units Subcutaneous 3 times per day Merwyn Katosavid B Simonds, MD   5,000 Units at 03/15/14 0604  . heparin injection 9,500 Units  100 Units/kg Dialysis PRN Trevor IhaJames L Deterding, MD      . hydrALAZINE (APRESOLINE) injection 10-40 mg  10-40 mg Intravenous Q4H PRN Merwyn Katosavid B Simonds, MD   20 mg at 03/14/14 2020  . hydrALAZINE (APRESOLINE) tablet 100 mg  100 mg Per Tube 4 times per day Lupita Leashouglas B McQuaid, MD   100 mg at 03/15/14 1226  . insulin aspart (novoLOG) injection 0-15 Units  0-15 Units Subcutaneous 6 times per day Kalman ShanMurali Ramaswamy, MD   2 Units at 03/13/14 2339  . ipratropium-albuterol (DUONEB) 0.5-2.5 (3) MG/3ML nebulizer solution 3 mL  3 mL Nebulization Q4H PRN Merwyn Katosavid B Simonds, MD      . lidocaine (PF) (XYLOCAINE) 1 % injection 5 mL  5 mL Intradermal PRN Trevor IhaJames  L Deterding, MD      . lidocaine-prilocaine (EMLA) cream 1 application  1 application Topical PRN Trevor IhaJames L Deterding, MD      . metoprolol (LOPRESSOR) injection 2.5-5 mg  2.5-5 mg Intravenous Q3H PRN Merwyn Katosavid B Simonds, MD   5 mg at 02/28/14 1806  . metoprolol tartrate (LOPRESSOR) 25 mg/10 mL oral suspension 50 mg  50 mg Per Tube BID Cyril Mourningakesh Alva V, MD   50 mg at 03/15/14 0925  . multivitamin liquid 5 mL  5 mL Per Tube Daily Heather Cornelison Pitts, RD   5 mL at 03/15/14 0925  . pantoprazole sodium (PROTONIX) 40 mg/20 mL oral suspension 40 mg  40 mg Per Tube Q1200 Merwyn Katosavid B Simonds, MD   40 mg at 03/15/14 1226  . pentafluoroprop-tetrafluoroeth (GEBAUERS) aerosol 1 application  1 application Topical PRN Trevor IhaJames L Deterding, MD      . scopolamine (TRANSDERM-SCOP) 1 MG/3DAYS 1.5 mg  1 patch Transdermal Q72H Lupita Leashouglas B McQuaid, MD   1.5 mg at 03/15/14 1227  . sodium chloride 0.9 % injection 10-40 mL  10-40 mL Intracatheter PRN Merwyn Katosavid B Simonds, MD   10 mL at 03/07/14 1102    No Known Allergies   REVIEW OF SYSTEMS:  (Positives checked otherwise negative)  Cannot be obtained due to intubation  Physical Examination  Filed Vitals:   03/15/14 1030 03/15/14 1100 03/15/14 1120 03/15/14 1215  BP: 194/90 155/65 152/67 91/48  Pulse: 96 96 83 77  Temp:  99.3 F (37.4 C)  99 F (37.2 C)  TempSrc:  Axillary  Oral  Resp: 27 26 26 15   Height:      Weight:  234 lb 9.1 oz (106.4 kg)    SpO2:  100% 100% 99%   Body mass index is 40.24 kg/(m^2).  General: non-reactive, morbidly obese, intubated  Head: [employer]/AT  Ear/Nose/Throat: Hearing cannot be tested, nares w/o erythema or drainage, oropharynx not examined due to encephalopathic state  Eyes: PERRL, cannot test EOM  Neck: trach in place, cannot test neck due to trach   Pulmonary: Sym exp, no rhonchi, & wheezing, + B rales with distant BS, RIJ central venous catheter  Cardiac: RRR, Nl S1, S2, no Murmurs, rubs or gallops  Vascular: Vessel Right Left    Radial Palpable Palpable  Ulnar Palpable Palpable  Brachial Palpable Palpable  Carotid Palpable, without bruit Palpable, without bruit  Aorta Not palpable due obesity N/A  Femoral Palpable Palpable  Popliteal Not palpable Not palpable  PT Faintly Palpable Faintly Palpable  DP Palpable Palpable   Gastrointestinal: soft, NTND, -G/R, - HSM, - masses, - CVAT B, epigastric PEG  Musculoskeletal: cannot be tested, Extremities without ischemic changes   Neurologic: cannot be tested  Psychiatric: cannot be test  Dermatologic: See M/S exam for extremity exam, no rashes otherwise noted  Lymph : No Cervical, Axillary, or Inguinal lymphadenopathy   Laboratory: CBC:    Component Value Date/Time   WBC 8.6 03/15/2014 0405   RBC 3.28* 03/15/2014 0405   HGB 9.3* 03/15/2014 0405   HCT 30.7* 03/15/2014 0405   PLT 346 03/15/2014 0405   MCV 93.6 03/15/2014 0405   MCH 28.4 03/15/2014 0405   MCHC 30.3 03/15/2014 0405   RDW 20.2* 03/15/2014 0405   LYMPHSABS 1.3 03/08/2014 0445   MONOABS 1.3* 03/08/2014 0445   EOSABS 0.2 03/08/2014 0445   BASOSABS 0.1 03/08/2014 0445    BMP:    Component Value Date/Time   NA 140 03/15/2014 0405   K 4.1 03/15/2014 0405   CL 96 03/15/2014 0405   CO2 26 03/15/2014 0405   GLUCOSE 115* 03/15/2014 0405   BUN 46* 03/15/2014 0405   CREATININE 6.14* 03/15/2014 0405   CALCIUM 9.2 03/15/2014 0405   GFRNONAA 8* 03/15/2014 0405   GFRAA 9* 03/15/2014 0405    Coagulation: Lab Results  Component Value Date   INR 1.15 03/14/2014   INR 1.38 03/06/2014   INR 1.45 03/02/2014   No results found for this basename: PTT    Radiology: Ir Angio Intra Extracran Sel Com Carotid Innominate Bilat Mod Sed  03/15/2014   CLINICAL DATA:  Bihemispheric ischemic strokes.  EXAM: BILATERAL COMMON CAROTID AND INNOMINATE ANGIOGRAPHY AND BILATERAL VERTEBRAL ARTERY ANGIOGRAMS  PROCEDURE: Contrast:  60 ML OMNIPAQUE IOHEXOL 300 MG/ML  SOLN  Anesthesia/Sedation:  Conscious sedation.   Medications: Versed 4 mg IV.  Fentanyl 125 mcg IV.  Following a full explanation of the procedure along with the potential associated complications, an informed witnessed consent was obtained.  The right groin was prepped and draped in the usual sterile fashion. Thereafter using modified Seldinger technique, transfemoral access into the right common femoral artery was obtained without difficulty. Over a 0.035 inch guidewire, a 5 French Pinnacle sheath was inserted. Through this, and also over 0.035 inch guidewire, a 5 French JB1 catheter was advanced to the aortic arch region and selectively positioned in the right common carotid artery, the right vertebral artery, the left common carotid artery and the left vertebral artery.  There were no acute complications. The patient tolerated the procedure well.  FINDINGS: The right common carotid arteriogram demonstrates the right external carotid artery and its major branches to be normal.  The right internal carotid artery at the bulb to the cranial skull base opacifies normally.  The petrous and the cavernous segments are widely patent.  There is tapered narrowing of the supraclinoid right ICA of approximately 30-50%. Approximately 50% narrowing is also seen at the origin of the right middle cerebral artery and to a lesser degree the origin of the left anterior cerebral artery.  The right middle cerebral artery and the right anterior cerebral artery branches distally appear to opacify into the capillary and the venous phases.  Cross opacification via the anterior communicating artery at the left anterior cerebral artery A2 and the distal A1 segment is seen.  The right vertebral artery origin is normal.  The vessel opacifies normally to the cranial  skull base. Normal opacification is seen of the right posterior-inferior cerebellar artery and the right vertebrobasilar junction.  The right anterior-inferior cerebellar artery/posterior-inferior cerebellar artery complex is  seen.  The basilar artery, the posterior cerebral arteries, superior cerebellar arteries and the anterior-inferior cerebellar arteries opacify normally into the capillary phases.  The venous phase demonstrates a nonvisualization of the left transverse sinus, and also of the sigmoid sinus and the internal jugular vein. Inflow of non-opacified blood is seen in the basilar artery from the contralateral vertebral artery.  The left common carotid arteriogram demonstrates left external carotid artery and its major branches to be normal.  The left internal carotid artery at the bulb to cranial skull base opacifies normally.  The petrous and the cavernous segments are widely patent.  The supraclinoid left ICA demonstrates approximately 50-60% narrowing. The left middle cerebral artery distal M1 segment demonstrates severe 70% focal stenosis. Filling of the trifurcation branches however is seen.  There is no opacification of the left anterior cerebral artery A1 segment.  The venous phase again demonstrates poor to nonvisualization of the left transverse sinus and the left sigmoid sinus.  No discrete left-sided internal jugular vein is seen.  The left vertebral artery origin is normal.  The vessel opacifies normally to the cranial skull base. Normal opacification is seen of the left posterior-inferior cerebellar artery and left vertebrobasilar junction.  The basilar artery, the posterior cerebral arteries, superior cerebellar arteries and the anterior inferior cerebellar arteries are seen to opacify into the capillary phase. The venous phase again demonstrates a poor filling of the mid left transverse sinus and the left sigmoid sinus and left internal jugular vein.  IMPRESSION: Focal approximately 70% narrowing of the left middle cerebral artery M1 segment distally, and approximately 50-70% narrowing of the left internal carotid artery supraclinoid segment. Occluded left anterior cerebral artery proximally.  Approximately  30-50% narrowing of the right internal carotid artery supraclinoid segment, with approximately 50% narrowing of the right middle cerebral artery proximally.  Very poor visualization of the left transverse sinus with nonvisualization of the left sigmoid sinus and the left internal jugular vein. This could be secondary to thrombosis of the left transverse sinus and the left sigmoid sinus and of the left internal jugular vein, rather than developmental.   Electronically Signed   By: Julieanne Cotton M.D.   On: 03/14/2014 16:43   Ir Angio Vertebral Sel Vertebral Bilat Mod Sed  03/15/2014   CLINICAL DATA:  Bihemispheric ischemic strokes.  EXAM: BILATERAL COMMON CAROTID AND INNOMINATE ANGIOGRAPHY AND BILATERAL VERTEBRAL ARTERY ANGIOGRAMS  PROCEDURE: Contrast:  60 ML OMNIPAQUE IOHEXOL 300 MG/ML  SOLN  Anesthesia/Sedation:  Conscious sedation.  Medications: Versed 4 mg IV.  Fentanyl 125 mcg IV.  Following a full explanation of the procedure along with the potential associated complications, an informed witnessed consent was obtained.  The right groin was prepped and draped in the usual sterile fashion. Thereafter using modified Seldinger technique, transfemoral access into the right common femoral artery was obtained without difficulty. Over a 0.035 inch guidewire, a 5 French Pinnacle sheath was inserted. Through this, and also over 0.035 inch guidewire, a 5 French JB1 catheter was advanced to the aortic arch region and selectively positioned in the right common carotid artery, the right vertebral artery, the left common carotid artery and the left vertebral artery.  There were no acute complications. The patient tolerated the procedure well.  FINDINGS: The right common carotid arteriogram demonstrates the right external carotid artery and its major branches  to be normal.  The right internal carotid artery at the bulb to the cranial skull base opacifies normally.  The petrous and the cavernous segments are widely  patent.  There is tapered narrowing of the supraclinoid right ICA of approximately 30-50%. Approximately 50% narrowing is also seen at the origin of the right middle cerebral artery and to a lesser degree the origin of the left anterior cerebral artery.  The right middle cerebral artery and the right anterior cerebral artery branches distally appear to opacify into the capillary and the venous phases.  Cross opacification via the anterior communicating artery at the left anterior cerebral artery A2 and the distal A1 segment is seen.  The right vertebral artery origin is normal.  The vessel opacifies normally to the cranial skull base. Normal opacification is seen of the right posterior-inferior cerebellar artery and the right vertebrobasilar junction.  The right anterior-inferior cerebellar artery/posterior-inferior cerebellar artery complex is seen.  The basilar artery, the posterior cerebral arteries, superior cerebellar arteries and the anterior-inferior cerebellar arteries opacify normally into the capillary phases.  The venous phase demonstrates a nonvisualization of the left transverse sinus, and also of the sigmoid sinus and the internal jugular vein. Inflow of non-opacified blood is seen in the basilar artery from the contralateral vertebral artery.  The left common carotid arteriogram demonstrates left external carotid artery and its major branches to be normal.  The left internal carotid artery at the bulb to cranial skull base opacifies normally.  The petrous and the cavernous segments are widely patent.  The supraclinoid left ICA demonstrates approximately 50-60% narrowing. The left middle cerebral artery distal M1 segment demonstrates severe 70% focal stenosis. Filling of the trifurcation branches however is seen.  There is no opacification of the left anterior cerebral artery A1 segment.  The venous phase again demonstrates poor to nonvisualization of the left transverse sinus and the left sigmoid  sinus.  No discrete left-sided internal jugular vein is seen.  The left vertebral artery origin is normal.  The vessel opacifies normally to the cranial skull base. Normal opacification is seen of the left posterior-inferior cerebellar artery and left vertebrobasilar junction.  The basilar artery, the posterior cerebral arteries, superior cerebellar arteries and the anterior inferior cerebellar arteries are seen to opacify into the capillary phase. The venous phase again demonstrates a poor filling of the mid left transverse sinus and the left sigmoid sinus and left internal jugular vein.  IMPRESSION: Focal approximately 70% narrowing of the left middle cerebral artery M1 segment distally, and approximately 50-70% narrowing of the left internal carotid artery supraclinoid segment. Occluded left anterior cerebral artery proximally.  Approximately 30-50% narrowing of the right internal carotid artery supraclinoid segment, with approximately 50% narrowing of the right middle cerebral artery proximally.  Very poor visualization of the left transverse sinus with nonvisualization of the left sigmoid sinus and the left internal jugular vein. This could be secondary to thrombosis of the left transverse sinus and the left sigmoid sinus and of the left internal jugular vein, rather than developmental.   Electronically Signed   By: Julieanne Cotton M.D.   On: 03/14/2014 16:43    Medical Decision Making  Meriam Chojnowski is a 43 y.o. female who presents with essentially ESRD.    Nephrology requesting tunneled dialysis catheter placement to start hemodialysis but per their note, pt is not a good candidate for long-term hemodialysis.  Given the lack of neurologic recovery, I think the bigger picture might override the access issues.  However,  it appears per the Palliative Care notes that the family is not ready to proceed with a palliative approach.  If nephrology is planning on proceeding with hemodialysis, we  can arrange tunneled dialysis catheter placement on Friday if the family is willing to proceed.  Leonides Sake, MD [facility] Office: [phone] Pager: [phone]  03/15/2014, 1:08 PM

## 2014-03-15 NOTE — Progress Notes (Signed)
Patient Lynn Gentry      DOB: 1970-07-17      EMV:[id number]   No family at bedside this afternoon. Case reviewed and I examined patient today. I will attempt to reach out to family tomorrow. Likely ongoing challenging situation.   Orvis Brill D.O. Palliative Medicine Team at [facility]  Pager: [phone] Team Phone: [phone]

## 2014-03-15 NOTE — Progress Notes (Signed)
STROKE TEAM PROGRESS NOTE   HISTORY Lynn Gentry is an 43 y.o. female with no previously documented medical disorder who was brought to [facility] with new onset slurred speech and left-sided weakness. Patient went to work as usual until 1500. She was found by her son at home at 1600 unable to get up out of a chair with left sided weakness, slurred speech. Patient was last known well around 3 PM this afternoon 02/21/2014. CT scan of her head showed an acute 4.1 x 1.5 cm right basal ganglia hemorrhage with mild mass effect and right to left shift of 5 mm. Patient's blood pressure was markedly elevated at 226/111. She was started on Cardene drip and blood pressure responded well. She has shown continual respiratory difficulty. She was initially given oxygen by nasal cannula followed by NBR, and subsequently placed on BiPAP. She continued to have oxygen saturations only in the high 80s to low 90s on 100% O2, with a respiratory rates in the 40s. ABG showed pH of 2.82, PCO2 37 PO2 of 57. Chest x-ray showed mild cardiomegaly with possible asymmetric pulmonary edema. BNP was 36,455. Because of deteriorating respiratory status intubation and mechanical ventilation was elected. CCM service was consulted. Patient was not administered TPA secondary to ICH. She was admitted to the neuro ICU  for further evaluation and treatment.   SUBJECTIVE (INTERVAL HISTORY) Husband is at bedside. Clinically no significant change. Still on vent, not able to wean off went. Pt is sweating and RT is adjusting vent settings.  OBJECTIVE Temp:  [98.3 F (36.8 C)-100.1 F (37.8 C)] 99 F (37.2 C) (10/28 1215) Pulse Rate:  [54-119] 76 (10/28 1300) Cardiac Rhythm:  [-] Normal sinus rhythm (10/28 1100) Resp:  [15-31] 15 (10/28 1300) BP: (74-194)/(35-90) 74/35 mmHg (10/28 1300) SpO2:  [99 %-100 %] 100 % (10/28 1300) FiO2 (%):  [40 %] 40 % (10/28 1215) Weight:  [233 lb 4 oz (105.8 kg)-238 lb 1.6 oz (108 kg)] 234 lb 9.1 oz  (106.4 kg) (10/28 1100)   Recent Labs Lab 03/14/14 2045 03/14/14 2347 03/15/14 0424 03/15/14 0803 03/15/14 1213  GLUCAP 114* 106* 104* 106* 120*    Recent Labs Lab 03/11/14 0345 03/12/14 0330 03/13/14 0340 03/14/14 0430 03/15/14 0405  NA 137 139 139 139  142 140  K 4.2 3.8 4.1 4.3  4.4 4.1  CL 95* 94* 95* 94*  96 96  CO2 26 27 26 24  25 26   GLUCOSE 109* 113* 127* 105*  103* 115*  BUN 38* 55* 72* 94*  93* 46*  CREATININE 5.10* 7.27* 8.63* 10.47*  10.22* 6.14*  CALCIUM 9.1 9.1 9.3 9.2  9.2 9.2  MG  --   --   --   --  2.3  PHOS 4.9* 6.1* 7.0* 8.3* 5.6*    Recent Labs Lab 03/11/14 0345 03/12/14 0330 03/13/14 0340 03/14/14 0430 03/15/14 0405  ALBUMIN 2.4* 2.4* 2.5* 2.7* 2.8*    Recent Labs Lab 03/11/14 0345 03/12/14 0735 03/13/14 0340 03/14/14 0430 03/15/14 0405  WBC 13.1* 8.9 9.4 9.5 8.6  HGB 8.9* 7.6* 8.6* 8.7* 9.3*  HCT 29.2* 24.8* 27.9* 28.1* 30.7*  MCV 93.9 91.5 91.2 91.5 93.6  PLT 379 342 401* 398 346   No results found for this basename: CKTOTAL, CKMB, CKMBINDEX, TROPONINI,  in the last 168 hours  Recent Labs  03/14/14 0430  LABPROT 14.8  INR 1.15   No results found for this basename: COLORURINE, APPERANCEUR, LABSPEC, PHURINE, GLUCOSEU, HGBUR, BILIRUBINUR, KETONESUR, PROTEINUR, UROBILINOGEN,  NITRITE, LEUKOCYTESUR,  in the last 72 hours     Component Value Date/Time   CHOL 177 02/28/2014 0350   Lab Results  Component Value Date   HGBA1C 5.8* 02/23/2014   No results found for this basename: labopia,  cocainscrnur,  labbenz,  amphetmu,  thcu,  labbarb    No results found for this basename: ETH,  in the last 168 hours  I have personally reviewed the radiological images below and agree with the radiology interpretations.  Ct Head Wo Contrast 02/28/14 - Stable 4.2 x 1.1 cm right lenticular nucleus hematoma with mild  surrounding vasogenic edema. Local mass effect upon the right  lateral ventricle with slight bowing of the septum to  the left by  3.9 mm without significant change.  This may represent result of hypertensive hemorrhage and can be  assessed as this clears.  Prominent white matter type changes asymmetric greater on the left  more conspicuous on the present exam than on the prior exam. The  patient may benefit from followup MR and MR angiogram for further  delineation. This may be ischemic in origin possibly related to  watershed type infarct. Other causes not excluded although felt to  be less likely.  02/24/2014 Stable appearance of the hematoma within the right basal ganglia. No new or progressive findings.  02/22/2014   1. No significant interval change in the patient's intraparenchymal hemorrhage at the right lateral basal ganglia, measuring 4.1 x 1.2 cm, with mild surrounding vasogenic edema. Mild associated mass effect, with 4 mm of leftward midline shift again seen. 2. Scattered small vessel ischemic microangiopathy. 3. Bilateral proptosis noted.     02/21/2014   1. Acute hemorrhage into the lateral right basal ganglia, likely indicating a hypertensive hemorrhage/hemorrhagic stroke. 2. Mild mass effect with shift of the midline to the left approximating 5 mm. Critical   US Renal Port 02/22/2014    1. Small echogenic kidneys consistent with chronic renal medical disease. No hydronephrosis. 2. The urinary bladder is decompressed by Foley catheter and cannot be evaluated.     Dg Chest Port 1 View 02/22/2014    1. Satisfactory positioning of endotracheal tube and right IJ line. 2. Cardiomegaly with persistent diffuse interstitial and airspace disease likely reflecting edema and congestive heart failure. 3. Bilateral pleural effusions and basilar airspace disease. This likely reflects atelectasis.    02/21/2014    Mild cardiac enlargement and possible asymmetric pulmonary edema. However, exam is limited by body habitus.     2D echo - - Left ventricle: The cavity size was normal. There was severe concentric  hypertrophy. Systolic function was normal. The estimated ejection fraction was in the range of 60% to 65%. Wall motion was normal; there were no regional wall motion abnormalities. - Left atrium: The atrium was mildly dilated. - Pulmonary arteries: PA peak pressure: 43 mm Hg (S). - Pericardium, extracardiac: A small, free-flowing pericardial effusion was identified posterior to the heart and circumferential to the heart. The fluid had no internal echoes.There was no evidence of hemodynamic compromise. Impressions: - The right ventricular systolic pressure was increased consistent with moderate pulmonary hypertension.  CUS - - Findings consistent with 1-39 percent stenosis involving the right internal carotid artery and the left internal carotid artery. - Right vertebral artery not imaged due to central line dressing. Left vertebral artery not imaged due to patient position.  DVT - - No obvious evidence of deep vein or superficial thrombosis involving the right lower extremity and left lower extremity. -  No evidence of Baker&'s cyst on the right or left.  EEG 02/28/14 - This EEG is abnormal with severe generalized slowing of cerebral activity consistent with severe encephalopathic state. These findings are nonspecific and can be seen with metabolic as well as toxic and degenerative encephalopathies. No evidence of an epileptic disorder was demonstrated.  MRI Brain 03/08/2014 1. Stable size and morphology of 4.5 x 1.4 x 2.8 cm subacute  intraparenchymal hematoma involving the right lentiform nucleus with  associated vasogenic edema and mass effect on the right lateral  ventricle. There is persistent 4 mm of right-to-left midline shift.  2. Acute left MCA territory infarct involving the deep white matter  of the left frontoparietal lobes. This is largely watershed in  distribution.  3. Additional small infarcts within the deep white matter of the  right frontal lobe, right  parieto-occipital region, and left pons.   MRA HEAD 03/08/14 1. Multi focal irregularity with associated stenoses within the  supra clinoid left ICA, left M1 segment, and possibly left A1  segment as above. Possible vasculitis or drug exposure could be  considered  Four vessel cerebral angiogram 1.Occluded RT ACA A1prox.  2.Severe focal stenosis of LT MCA M1 prox to trifurcation.  3. Approx 50 % stenosis LT ICA suprclinoid seg.  4. Approx 30 to 50 % stenosis Rt I CA supraclnoid seg.  5. 30 to 50 % stenosis RT MCA prox..  A1C 5.8 and LDL 92  PHYSICAL EXAM Young obese Caucasian lady not in distress. On trach. Afebrile. Head is nontraumatic. Neck is supple without bruit. Cardiac exam soft ejection murmur RRR .no gallop. Lungs bilateral crackles and  rhonchi to auscultation. Distal pulses are well felt.   Neurological Exam: still on ventilation with trach, eyes open on voice but not tracking and not following commands. Eyes in the right side gaze position. Pupils 3 mm reactive. Fundi not visualized. Left lower face weakness, corneal and gag present. Spontaneously moving LUE and Withdraw to LLE on pain stimulation. Not moving RUE and trace withdraw to RLE. Decorticate posture. Increased muscle tone on bilateral UEs and RLE. Bilateral babinski down going.  ASSESSMENT/PLAN Lynn Gentry is a 43 y.o. female with no documented medical history found with new onset slurred speech and left-sided weakness. CT showed right external capsule hemorrhage. However, her condition getting worse with pulmonary edema, AKI requiring HD, respiratory failure needing ventilation. Transferred pt to CCM for better management. Days after admission, pt was found to not moving on the right, CT at that time no change but later repeat CT showed left subcortical hypodensity. MRI not able to perform initially due to not able to lie flat. But recently done showed extensive left subcortical stroke as well as punctate right  frontal deep WM and right parietoccipital region as well as left pons.   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   Systemic vasculitis labs all negative. Due to her general neurological and medical condition she is not a candidate at the present time for brain biopsy for definitive diagnosis of CNS vasculitis.Plan to check urine drug screen  but patient is anuric hence unable to do so.  Cerebral angiogram did not show pattern of vasculitis.  EEG showed severe slowing but no seizure.  2D echo unremarkable  Carotid doppler unremarkable  HgbA1c 5.8  Heparin subq for VTE prophylaxis  Resultant VDRF, vegetative state, dense right hemiplegia with spasticity and mild left hemiparesis, decorticate posture  no antithrombotics prior to admission, currently not on antithrombotics due to hemorrhage.  Ongoing aggressive risk factor management  Acute Respiratory Failure with severe acute pulmonary edema  trach on 03/02/14  CCM following  Diuresing, IV Lasix, Hemodialysis, urine output minimal  CXR series  Malignant Hypertension  BP 226/111 at onset  Home meds:   none  SBP goal < 150 due to presence of end organ damage  On amlodipine, clonidine patch and metoprolol and hydralazine and enalapril  off cardene   BP stabilized  Acute renal failure with volume overload, suspect baseline chronic kidney disease  Diuresis and management per critical care, on HD  Continue to trend Cre.  HD as per nephrology  Other Stroke Risk  Factors ETOH use   Morbid Obesity, Body mass index is 40.24 kg/(m^2).   Hospital day # 22 03/15/2014 2:44 PM   Marvel Plan, MD PhD Stroke Neurology 03/15/2014 2:44 PM   To contact Stroke Continuity provider, please refer to [email or web address]. After hours, contact General Neurology

## 2014-03-16 ENCOUNTER — Encounter (HOSPITAL_COMMUNITY): Payer: Self-pay | Admitting: Radiology

## 2014-03-16 ENCOUNTER — Inpatient Hospital Stay (HOSPITAL_COMMUNITY): Payer: 59

## 2014-03-16 DIAGNOSIS — Z515 Encounter for palliative care: Secondary | ICD-10-CM

## 2014-03-16 DIAGNOSIS — J398 Other specified diseases of upper respiratory tract: Secondary | ICD-10-CM

## 2014-03-16 LAB — CBC
HCT: 33.2 % — ABNORMAL LOW (ref 36.0–46.0)
Hemoglobin: 10.1 g/dL — ABNORMAL LOW (ref 12.0–15.0)
MCH: 29 pg (ref 26.0–34.0)
MCHC: 30.4 g/dL (ref 30.0–36.0)
MCV: 95.4 fL (ref 78.0–100.0)
Platelets: 333 10*3/uL (ref 150–400)
RBC: 3.48 MIL/uL — AB (ref 3.87–5.11)
RDW: 20.2 % — ABNORMAL HIGH (ref 11.5–15.5)
WBC: 10.5 10*3/uL (ref 4.0–10.5)

## 2014-03-16 LAB — MAGNESIUM: Magnesium: 2.3 mg/dL (ref 1.5–2.5)

## 2014-03-16 LAB — RENAL FUNCTION PANEL
ALBUMIN: 3 g/dL — AB (ref 3.5–5.2)
Anion gap: 19 — ABNORMAL HIGH (ref 5–15)
BUN: 31 mg/dL — ABNORMAL HIGH (ref 6–23)
CALCIUM: 9.7 mg/dL (ref 8.4–10.5)
CO2: 25 mEq/L (ref 19–32)
Chloride: 94 mEq/L — ABNORMAL LOW (ref 96–112)
Creatinine, Ser: 5.39 mg/dL — ABNORMAL HIGH (ref 0.50–1.10)
GFR, EST AFRICAN AMERICAN: 10 mL/min — AB (ref >90)
GFR, EST NON AFRICAN AMERICAN: 9 mL/min — AB (ref >90)
Glucose, Bld: 116 mg/dL — ABNORMAL HIGH (ref 70–99)
Phosphorus: 5 mg/dL — ABNORMAL HIGH (ref 2.3–4.6)
Potassium: 4 mEq/L (ref 3.7–5.3)
SODIUM: 138 meq/L (ref 137–147)

## 2014-03-16 LAB — GLUCOSE, CAPILLARY
GLUCOSE-CAPILLARY: 127 mg/dL — AB (ref 70–99)
GLUCOSE-CAPILLARY: 130 mg/dL — AB (ref 70–99)
Glucose-Capillary: 140 mg/dL — ABNORMAL HIGH (ref 70–99)
Glucose-Capillary: 116 mg/dL — ABNORMAL HIGH (ref 70–99)
Glucose-Capillary: 110 mg/dL — ABNORMAL HIGH (ref 70–99)
Glucose-Capillary: 114 mg/dL — ABNORMAL HIGH (ref 70–99)
Glucose-Capillary: 115 mg/dL — ABNORMAL HIGH (ref 70–99)

## 2014-03-16 MED ORDER — HYDRALAZINE HCL 50 MG PO TABS
50.0000 mg | ORAL_TABLET | Freq: Three times a day (TID) | ORAL | Status: DC
  Administered 2014-03-16 – 2014-03-18 (×5): 50 mg
  Filled 2014-03-16 (×9): qty 1

## 2014-03-16 MED ORDER — IOHEXOL 350 MG/ML SOLN
50.0000 mL | Freq: Once | INTRAVENOUS | Status: AC | PRN
  Administered 2014-03-16: 50 mL via INTRAVENOUS

## 2014-03-16 NOTE — Progress Notes (Signed)
Patient EO:[id number] Barsanti      DOB: 03-28-1971      TGP:[id number]   Palliative Medicine Team at [facility] Progress Note    Subjective: Trach/vent.  No purposeful interactions   Filed Vitals:   03/16/14 1138  BP: 114/56  Pulse: 63  Temp: 98.8 F (37.1 C)  Resp: 15   Physical exam: GEN: NAD on vent CV: RRR LUNGS; CTAB ABD: soft, ND Neuro: no purposeful interactions.  Extensor response to pain   CBC    Component Value Date/Time   WBC 10.5 03/16/2014 0500   RBC 3.48* 03/16/2014 0500   HGB 10.1* 03/16/2014 0500   HCT 33.2* 03/16/2014 0500   PLT 333 03/16/2014 0500   MCV 95.4 03/16/2014 0500   MCH 29.0 03/16/2014 0500   MCHC 30.4 03/16/2014 0500   RDW 20.2* 03/16/2014 0500   LYMPHSABS 1.3 03/08/2014 0445   MONOABS 1.3* 03/08/2014 0445   EOSABS 0.2 03/08/2014 0445   BASOSABS 0.1 03/08/2014 0445    CMP     Component Value Date/Time   NA 138 03/16/2014 0500   K 4.0 03/16/2014 0500   CL 94* 03/16/2014 0500   CO2 25 03/16/2014 0500   GLUCOSE 116* 03/16/2014 0500   BUN 31* 03/16/2014 0500   CREATININE 5.39* 03/16/2014 0500   CALCIUM 9.7 03/16/2014 0500   PROT 6.7 02/24/2014 0450   ALBUMIN 3.0* 03/16/2014 0500   AST 20 02/24/2014 0450   ALT 16 02/24/2014 0450   ALKPHOS 112 02/24/2014 0450   BILITOT 0.6 02/24/2014 0450   GFRNONAA 9* 03/16/2014 0500   GFRAA 10* 03/16/2014 0500     Assessment and plan: 43 yo female with basal ganglia hemorrhagic CVA with resultant severe neurologic injury, vent dependent respiratory failure, renal failure requiring iHD. Grim prognosis for neurologic recovery.   1. Code Status- Full  2. Goals of Care- Challenging situation and I am just getting to know family.  Prolonged hospitalization and family goals appear set from previous notes.  I do not think it will be beneficial for me to introduce myself and attempt to have larger goals of care conversation as this has been done multiple times.  Ultimately, if medical decisions for  her care need to be made, it may be best for multi-specialty meeting with family.  There should not be a plan to wait for palliative care conversations to make decisions as at this time, they are clear in wanting everything and anything offered to prolong her life. Husband has been diligently calling insurance company to see about LTAC vs Vent SNF here in [place]. Discussed with case manager today.  I do not suspect she will be wean able. i will continue to support family as able, but do not expect any large shift in goals in the short term or possibly long-term.    3.  Symptom management  A. Secretions- scop patch   Total Time: 30 minutes  >50% of time spent in counseling and coordination of care regarding above assessment and plan.   Orvis Brill D.O. Palliative Medicine Team at [facility]  Pager: [phone] Team Phone: [phone]

## 2014-03-16 NOTE — Progress Notes (Signed)
PULMONARY / CRITICAL CARE MEDICINE   Name: Lynn Gentry MRN: [id number] DOB: 11/12/1970    ADMISSION DATE:  02/21/2014 CONSULTATION DATE:  03/16/2014  REFERRING MD :  Pearlean Brownie  CHIEF COMPLAINT:  Respiratory failure in setting of ICH  INITIAL PRESENTATION: 43 y.o. F brought to [facility] ED on 10/6 with left sided weakness, slurred speech, inability to stand up.  In ED, found to have acute lateral right basal ganglia ICH with mild mass effect and 5mm MLS.  Admitted to neuro ICU and developed acute respiratory failure, failed bipap and PCCM called for intubation and management. She was admitted to neuro ICU and later that evening had increasing O2 demands.  She had no improvement in SpO2, work of breathing, and mental status despite 3 hours of BiPAP.  PCCM was consulted for intubation.  STUDIES:  10/06 CT head:  acute hemorrhage into the lateral right basal ganglia, likely indicating a hypertensive hemorrhage/hemorrhagic stroke.  Mild mass effect with 14mm MLS to the left. 10/07 Renal US:  echogenic kidneys s/w ckd, no hydro.  10/07 CT head: [facility] 10/07 repeat CT head: [facility] 10/07 Doppler carotid: consistent with 1-39% stenosis involving the R internal carotid art and the L internal carotid art 10/07 TTE: LVEF 60-65%. Severe concentric LVH. LA dilatation. PASP est 43 mmHg 10/09 CT head: [facility] 10/09 LE venous Dopplers (ordered by Stroke) >> negative 10/21 MRI / MRA brain >> stable subacute intraparenchymal hemorrhage, localized vasogenic edema, mild mass effect, 4 mm R to L shift, acute L MCA infarct, largely watershed in distribution, additional small infarcts in deep white matter  SIGNIFICANT EVENTS: 10/06  Admitted for ICH, developed respiratory failure requiring intubation. 10/07  Persistent hypertension. Remained on nicardipine gtt. Persistent volume overload snd pulm edema pattern on CXR. Lasix gtt resulted in minimal increase in Uo 10/08  Worsening renal function, oliguria. Renal consult. HD  performed 10/09  Acute change in neuro status. STAT CT head ordered: [facility] 10/09  Further HD planned 10/14  HD with negative fluid balance 10/15  Trach (JY) 10/16  Febrile and purulent matter from nasal cavity and trach. 10/17  Transition from fentanyl gtt to Duragesic patch. Begin PSV weaning 10/18  Transfer to vent SDU bed ordered. Tolerating PSV 10 cm H2O 10/19  Transfer to SDU/2900 10/21  Off vent 36hours, but went back on in evening for secretions/tachypnea; Persistent diarrhea, d/c'd senokot, Family conversation from [place] explaining dismal prognosis 10/22  Mucus plugging this AM; Neurology had discussion with family explaining dismal prognosis, family desires full code 10/23- active pall care discussions 10/25- pall care meeting not much progress  SUBJ -no changes, increased HR and BP with weaning.  VITAL SIGNS: Temp:  [97.8 F (36.6 C)-99.3 F (37.4 C)] 98.8 F (37.1 C) (10/29 1138) Pulse Rate:  [63-108] 63 (10/29 1138) Resp:  [15-32] 15 (10/29 1138) BP: (74-173)/(35-96) 114/56 mmHg (10/29 1138) SpO2:  [98 %-100 %] 100 % (10/29 1138) FiO2 (%):  [40 %] 40 % (10/29 1224) Weight:  [103 kg (227 lb 1.2 oz)] 103 kg (227 lb 1.2 oz) (10/29 0428)  VENTILATOR SETTINGS: Vent Mode:  [-] PRVC FiO2 (%):  [40 %] 40 % Set Rate:  [15 bmp] 15 bmp Vt Set:  [500 mL] 500 mL PEEP:  [5 cmH20] 5 cmH20 Pressure Support:  [8 cmH20] 8 cmH20 Plateau Pressure:  [19 cmH20-22 cmH20] 22 cmH20  INTAKE / OUTPUT: Intake/Output     10/28 0701 - 10/29 0700 10/29 0701 - 10/30 0700   I.V. (mL/kg) 60 (0.6)  Other     NG/GT 420 100   Total Intake(mL/kg) 480 (4.7) 100 (1)   Other 2000    Total Output 2000     Net -1520 +100         PHYSICAL EXAMINATION:  Gen: only opens eyes to voice that's it, no other responses HEENT: Trach site clean PULM: ronchi mild diffuse CV: distant tones RRR, no mgr AB: BS+, soft, nontender, PEG Ext: 2 plus  edema Neuro: opens eyes to voice, no movement of  extremities on my exam, no changes  LABS:  I have reviewed all of today's lab results. Relevant abnormalities are discussed in the A/P section  10/22 CXR > stable bilateral airspace disease  ASSESSMENT / PLAN:  PULMONARY ETT 10/6 >> 10/15 Trach tube 10/15 >>  A: Acute hypoxemic respiratory failure > nearly completely off vent support HCAP> MSSA Mucus plugging 10/22 Edema component P:   Guaifenesin Chest PT as able Unable to wean, needs vent SNF. Cont vent bundle Neg balance on HD needed, due Monday  CARDIOVASCULAR CVL R IJ 10/6 >>  A:  HTN persistent Diastolic CHF > currently mild volume overload P:  Unable to remove cvl due to poor IV access, and no PICC given renal function. Continue hydralazine, clonidine, metoprolol, norvasc, enalapril Follow BP after HD in am  PRN hydralazine to maintain SBP < 170 mmHg PRN metoprolol to maintain HR < 115/min  RENAL L IJ HD cath 10/08 >> 10/20 Perm Cath L [place] 10/20 >> A:   Acute renal failure> now ESRD  P:   kvo HD today  GASTROINTESTINAL A:   Obesity P:   SUP: Pantoprazole Cont TFs tolerated well  HEMATOLOGIC A:   ICU acquired anemia P:  DVT px: SQ heparin Monitor CBC intermittently and after neg balance hemoconcetration Nephrology considering feraheme Transfuse per usual ICU guidelines  INFECTIOUS A:   HCAP > MSSA P:   Resp 10/17 >> Abundant staph aureus MSSA Blood 10/16 >> ng C.diff 10/20 >> neg  Fluconazole 10/15 >> 10/17 Vancomycin 10/16 >> 10/22 Zosyn 10/16 >> 10/20 Ancef 10/21 >> MWF with HD plan will plan of 10 days  ENDOCRINE A:   Hyperglycemia, controlled P:   CBG's q4hr Cont SSI  NEUROLOGIC A:   Hypertensive R basal Ganglion Hemorrhage  Acute L MCA stroke Effective quadriplegic at this point Acute Encephalopathy due to strokes > overall prognosis is grim with little chance of meaningful neurologic recovery  P:   Mgmt per Stroke team, angio done and pending results, will defer to  neuro. Cont PRN fentanyl  RASS goal: 0 D/w pall care, family NOT realistic at all  FAMILY: 10/28 no family bedside.   TODAY'S SUMMARY: HD per renal.  Further recommendations per neuro.  Awaiting placement efforts.   Alyson ReedyWesam G. Omir Cooprider, M.D. [facility] Pulmonary/Critical Care Medicine. Pager: [phone]. After hours pager: [phone].

## 2014-03-16 NOTE — Progress Notes (Signed)
CPT not giving at this time. RT unavailable. Will do at 1600.

## 2014-03-16 NOTE — Progress Notes (Signed)
   Daily Progress Note  Assessment/Planning: Vegetative state from CVA, chronic kidney disease stage V,     Tried to contact family to see if they were proceeding with HD  Let us know if family agrees to proceed with HD and TDC placement  Subjective  - 14 Days Post-Op  No change overnight, no family at bedside  Objective Filed Vitals:   03/16/14 0428 03/16/14 0722 03/16/14 0734 03/16/14 0927  BP: 144/71 144/72 165/88 169/82  Pulse:  84 107 104  Temp: 98.9 F (37.2 C)  99.2 F (37.3 C)   TempSrc: Axillary  Oral   Resp:  29 32   Height:      Weight: 227 lb 1.2 oz (103 kg)     SpO2: 100% 100% 100%     Intake/Output Summary (Last 24 hours) at 03/16/14 0959 Last data filed at 03/16/14 0600  Gross per 24 hour  Intake    480 ml  Output   2000 ml  Net  -1520 ml   NEURO non-responsive PULM  On vent., trach CV  RRR GI  TF running via PEG  Laboratory CBC    Component Value Date/Time   WBC 10.5 03/16/2014 0500   HGB 10.1* 03/16/2014 0500   HCT 33.2* 03/16/2014 0500   PLT 333 03/16/2014 0500    BMET    Component Value Date/Time   NA 138 03/16/2014 0500   K 4.0 03/16/2014 0500   CL 94* 03/16/2014 0500   CO2 25 03/16/2014 0500   GLUCOSE 116* 03/16/2014 0500   BUN 31* 03/16/2014 0500   CREATININE 5.39* 03/16/2014 0500   CALCIUM 9.7 03/16/2014 0500   GFRNONAA 9* 03/16/2014 0500   GFRAA 10* 03/16/2014 0500    Leonides Sake, MD [facility] Office: [phone] Pager: [phone]  03/16/2014, 9:59 AM

## 2014-03-16 NOTE — Progress Notes (Signed)
Patient ID: Lynn Gentry, female    DOB: 19-Aug-1970, 43 y.o.   MRN: [id number] Nephrology Progress Note  S: eyes open, nonresponsive  O:BP 165/88  Pulse 107  Temp(Src) 99.2 F (37.3 C) (Oral)  Resp 32  Ht 5\' 4"  (1.626 m)  Wt 227 lb 1.2 oz (103 kg)  BMI 38.96 kg/m2  SpO2 100%  LMP 03/01/2014  Intake/Output Summary (Last 24 hours) at 03/16/14 0742 Last data filed at 03/16/14 0600  Gross per 24 hour  Intake    480 ml  Output   2000 ml  Net  -1520 ml   Intake/Output: I/O last 3 completed shifts: In: 1705 [I.V.:885; Other:180; NG/GT:640] Out: 2000 [Other:2000]  Intake/Output this shift:    Weight change: 4 lb 13.6 oz (2.2 kg) Gen: NAD  HEENT: eyes open, deviated left but do return to center CVS: RRR, 2/6 holosys M  Resp: trach collar, rhonchi bilat Abd: soft, obese, +BS Liver down 5 cm  Ext: 1+ pedal edema  Neuro: opens eyes to tactile and verbal stimuli. Raises left hand, moved left foot  Skin: diaphoretic Access: IJ cath    Recent Labs Lab 03/10/14 0500 03/11/14 0345 03/12/14 0330 03/13/14 0340 03/14/14 0430 03/15/14 0405 03/16/14 0500  NA 137 137 139 139 139  142 140 138  K 3.9 4.2 3.8 4.1 4.3  4.4 4.1 4.0  CL 94* 95* 94* 95* 94*  96 96 94*  CO2 28 26 27 26 24  25 26 25   GLUCOSE 115* 109* 113* 127* 105*  103* 115* 116*  BUN 64* 38* 55* 72* 94*  93* 46* 31*  CREATININE 7.17* 5.10* 7.27* 8.63* 10.47*  10.22* 6.14* 5.39*  ALBUMIN 2.1* 2.4* 2.4* 2.5* 2.7* 2.8* 3.0*  CALCIUM 8.9 9.1 9.1 9.3 9.2  9.2 9.2 9.7  PHOS 8.0* 4.9* 6.1* 7.0* 8.3* 5.6* 5.0*   Liver Function Tests:  Recent Labs Lab 03/14/14 0430 03/15/14 0405 03/16/14 0500  ALBUMIN 2.7* 2.8* 3.0*   No results found for this basename: LIPASE, AMYLASE,  in the last 168 hours No results found for this basename: AMMONIA,  in the last 168 hours CBC:  Recent Labs Lab 03/12/14 0735 03/13/14 0340 03/14/14 0430 03/15/14 0405 03/16/14 0500  WBC 8.9 9.4 9.5 8.6 10.5  HGB 7.6* 8.6* 8.7*  9.3* 10.1*  HCT 24.8* 27.9* 28.1* 30.7* 33.2*  MCV 91.5 91.2 91.5 93.6 95.4  PLT 342 401* 398 346 333   Cardiac Enzymes: No results found for this basename: CKTOTAL, CKMB, CKMBINDEX, TROPONINI,  in the last 168 hours CBG:  Recent Labs Lab 03/15/14 1213 03/15/14 1635 03/15/14 2021 03/16/14 0031 03/16/14 0406  GLUCAP 120* 108* 113* 130* 140*    Iron Studies: No results found for this basename: IRON, TIBC, TRANSFERRIN, FERRITIN,  in the last 72 hours Studies/Results: Ir Angio Intra Extracran Sel Com Carotid Innominate Bilat Mod Sed  03/15/2014   CLINICAL DATA:  Bihemispheric ischemic strokes.  EXAM: BILATERAL COMMON CAROTID AND INNOMINATE ANGIOGRAPHY AND BILATERAL VERTEBRAL ARTERY ANGIOGRAMS  PROCEDURE: Contrast:  60 ML OMNIPAQUE IOHEXOL 300 MG/ML  SOLN  Anesthesia/Sedation:  Conscious sedation.  Medications: Versed 4 mg IV.  Fentanyl 125 mcg IV.  Following a full explanation of the procedure along with the potential associated complications, an informed witnessed consent was obtained.  The right groin was prepped and draped in the usual sterile fashion. Thereafter using modified Seldinger technique, transfemoral access into the right common femoral artery was obtained without difficulty. Over a 0.035 inch guidewire, a 5  French Pinnacle sheath was inserted. Through this, and also over 0.035 inch guidewire, a 5 French JB1 catheter was advanced to the aortic arch region and selectively positioned in the right common carotid artery, the right vertebral artery, the left common carotid artery and the left vertebral artery.  There were no acute complications. The patient tolerated the procedure well.  FINDINGS: The right common carotid arteriogram demonstrates the right external carotid artery and its major branches to be normal.  The right internal carotid artery at the bulb to the cranial skull base opacifies normally.  The petrous and the cavernous segments are widely patent.  There is tapered  narrowing of the supraclinoid right ICA of approximately 30-50%. Approximately 50% narrowing is also seen at the origin of the right middle cerebral artery and to a lesser degree the origin of the left anterior cerebral artery.  The right middle cerebral artery and the right anterior cerebral artery branches distally appear to opacify into the capillary and the venous phases.  Cross opacification via the anterior communicating artery at the left anterior cerebral artery A2 and the distal A1 segment is seen.  The right vertebral artery origin is normal.  The vessel opacifies normally to the cranial skull base. Normal opacification is seen of the right posterior-inferior cerebellar artery and the right vertebrobasilar junction.  The right anterior-inferior cerebellar artery/posterior-inferior cerebellar artery complex is seen.  The basilar artery, the posterior cerebral arteries, superior cerebellar arteries and the anterior-inferior cerebellar arteries opacify normally into the capillary phases.  The venous phase demonstrates a nonvisualization of the left transverse sinus, and also of the sigmoid sinus and the internal jugular vein. Inflow of non-opacified blood is seen in the basilar artery from the contralateral vertebral artery.  The left common carotid arteriogram demonstrates left external carotid artery and its major branches to be normal.  The left internal carotid artery at the bulb to cranial skull base opacifies normally.  The petrous and the cavernous segments are widely patent.  The supraclinoid left ICA demonstrates approximately 50-60% narrowing. The left middle cerebral artery distal M1 segment demonstrates severe 70% focal stenosis. Filling of the trifurcation branches however is seen.  There is no opacification of the left anterior cerebral artery A1 segment.  The venous phase again demonstrates poor to nonvisualization of the left transverse sinus and the left sigmoid sinus.  No discrete left-sided  internal jugular vein is seen.  The left vertebral artery origin is normal.  The vessel opacifies normally to the cranial skull base. Normal opacification is seen of the left posterior-inferior cerebellar artery and left vertebrobasilar junction.  The basilar artery, the posterior cerebral arteries, superior cerebellar arteries and the anterior inferior cerebellar arteries are seen to opacify into the capillary phase. The venous phase again demonstrates a poor filling of the mid left transverse sinus and the left sigmoid sinus and left internal jugular vein.  IMPRESSION: Focal approximately 70% narrowing of the left middle cerebral artery M1 segment distally, and approximately 50-70% narrowing of the left internal carotid artery supraclinoid segment. Occluded left anterior cerebral artery proximally.  Approximately 30-50% narrowing of the right internal carotid artery supraclinoid segment, with approximately 50% narrowing of the right middle cerebral artery proximally.  Very poor visualization of the left transverse sinus with nonvisualization of the left sigmoid sinus and the left internal jugular vein. This could be secondary to thrombosis of the left transverse sinus and the left sigmoid sinus and of the left internal jugular vein, rather than developmental.  Electronically Signed   By: Julieanne Cotton M.D.   On: 03/14/2014 16:43   Ir Angio Vertebral Sel Vertebral Bilat Mod Sed  03/15/2014   CLINICAL DATA:  Bihemispheric ischemic strokes.  EXAM: BILATERAL COMMON CAROTID AND INNOMINATE ANGIOGRAPHY AND BILATERAL VERTEBRAL ARTERY ANGIOGRAMS  PROCEDURE: Contrast:  60 ML OMNIPAQUE IOHEXOL 300 MG/ML  SOLN  Anesthesia/Sedation:  Conscious sedation.  Medications: Versed 4 mg IV.  Fentanyl 125 mcg IV.  Following a full explanation of the procedure along with the potential associated complications, an informed witnessed consent was obtained.  The right groin was prepped and draped in the usual sterile fashion.  Thereafter using modified Seldinger technique, transfemoral access into the right common femoral artery was obtained without difficulty. Over a 0.035 inch guidewire, a 5 French Pinnacle sheath was inserted. Through this, and also over 0.035 inch guidewire, a 5 French JB1 catheter was advanced to the aortic arch region and selectively positioned in the right common carotid artery, the right vertebral artery, the left common carotid artery and the left vertebral artery.  There were no acute complications. The patient tolerated the procedure well.  FINDINGS: The right common carotid arteriogram demonstrates the right external carotid artery and its major branches to be normal.  The right internal carotid artery at the bulb to the cranial skull base opacifies normally.  The petrous and the cavernous segments are widely patent.  There is tapered narrowing of the supraclinoid right ICA of approximately 30-50%. Approximately 50% narrowing is also seen at the origin of the right middle cerebral artery and to a lesser degree the origin of the left anterior cerebral artery.  The right middle cerebral artery and the right anterior cerebral artery branches distally appear to opacify into the capillary and the venous phases.  Cross opacification via the anterior communicating artery at the left anterior cerebral artery A2 and the distal A1 segment is seen.  The right vertebral artery origin is normal.  The vessel opacifies normally to the cranial skull base. Normal opacification is seen of the right posterior-inferior cerebellar artery and the right vertebrobasilar junction.  The right anterior-inferior cerebellar artery/posterior-inferior cerebellar artery complex is seen.  The basilar artery, the posterior cerebral arteries, superior cerebellar arteries and the anterior-inferior cerebellar arteries opacify normally into the capillary phases.  The venous phase demonstrates a nonvisualization of the left transverse sinus, and  also of the sigmoid sinus and the internal jugular vein. Inflow of non-opacified blood is seen in the basilar artery from the contralateral vertebral artery.  The left common carotid arteriogram demonstrates left external carotid artery and its major branches to be normal.  The left internal carotid artery at the bulb to cranial skull base opacifies normally.  The petrous and the cavernous segments are widely patent.  The supraclinoid left ICA demonstrates approximately 50-60% narrowing. The left middle cerebral artery distal M1 segment demonstrates severe 70% focal stenosis. Filling of the trifurcation branches however is seen.  There is no opacification of the left anterior cerebral artery A1 segment.  The venous phase again demonstrates poor to nonvisualization of the left transverse sinus and the left sigmoid sinus.  No discrete left-sided internal jugular vein is seen.  The left vertebral artery origin is normal.  The vessel opacifies normally to the cranial skull base. Normal opacification is seen of the left posterior-inferior cerebellar artery and left vertebrobasilar junction.  The basilar artery, the posterior cerebral arteries, superior cerebellar arteries and the anterior inferior cerebellar arteries are seen to opacify into the  capillary phase. The venous phase again demonstrates a poor filling of the mid left transverse sinus and the left sigmoid sinus and left internal jugular vein.  IMPRESSION: Focal approximately 70% narrowing of the left middle cerebral artery M1 segment distally, and approximately 50-70% narrowing of the left internal carotid artery supraclinoid segment. Occluded left anterior cerebral artery proximally.  Approximately 30-50% narrowing of the right internal carotid artery supraclinoid segment, with approximately 50% narrowing of the right middle cerebral artery proximally.  Very poor visualization of the left transverse sinus with nonvisualization of the left sigmoid sinus and the  left internal jugular vein. This could be secondary to thrombosis of the left transverse sinus and the left sigmoid sinus and of the left internal jugular vein, rather than developmental.   Electronically Signed   By: Julieanne Cotton M.D.   On: 03/14/2014 16:43   . amLODipine  10 mg Per Tube QHS  . antiseptic oral rinse  7 mL Mouth Rinse QID  . chlorhexidine  15 mL Mouth Rinse BID  . darbepoetin (ARANESP) injection - DIALYSIS  100 mcg Intravenous Q Mon-HD  . feeding supplement (NEPRO CARB STEADY)  1,000 mL Per Tube Q24H  . ferric gluconate (FERRLECIT/NULECIT) IV  125 mg Intravenous Q M,W,F-HD  . guaiFENesin  10 mL Per Tube TID  . heparin subcutaneous  5,000 Units Subcutaneous 3 times per day  . hydrALAZINE  50 mg Per Tube 3 times per day  . insulin aspart  0-15 Units Subcutaneous 6 times per day  . metoprolol tartrate  50 mg Per Tube BID  . multivitamin  5 mL Per Tube Daily  . pantoprazole sodium  40 mg Per Tube Q1200  . scopolamine  1 patch Transdermal Q72H    BMET    Component Value Date/Time   NA 138 03/16/2014 0500   K 4.0 03/16/2014 0500   CL 94* 03/16/2014 0500   CO2 25 03/16/2014 0500   GLUCOSE 116* 03/16/2014 0500   BUN 31* 03/16/2014 0500   CREATININE 5.39* 03/16/2014 0500   CALCIUM 9.7 03/16/2014 0500   GFRNONAA 9* 03/16/2014 0500   GFRAA 10* 03/16/2014 0500   CBC    Component Value Date/Time   WBC 10.5 03/16/2014 0500   RBC 3.48* 03/16/2014 0500   HGB 10.1* 03/16/2014 0500   HCT 33.2* 03/16/2014 0500   PLT 333 03/16/2014 0500   MCV 95.4 03/16/2014 0500   MCH 29.0 03/16/2014 0500   MCHC 30.4 03/16/2014 0500   RDW 20.2* 03/16/2014 0500   LYMPHSABS 1.3 03/08/2014 0445   MONOABS 1.3* 03/08/2014 0445   EOSABS 0.2 03/08/2014 0445   BASOSABS 0.1 03/08/2014 0445    Assessment/Plan: 1. ESRD: MWF. 2. Vascular access: will need TDC, however currently holding off based on palliative discussions Needs more long term access with proceeding with  counseling 3. 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 CVA: no improvement, vegetative state currently. CT angiogram showed no evidence of vasculitis. 6. Palliative following 7. HTN   [name] 7:42 AM I have seen and examined this patient and agree with the plan of care seen , eval, examined and discussed wth resident. .  Shantae Vantol L 03/17/2014, 7:20 AM

## 2014-03-16 NOTE — Progress Notes (Signed)
STROKE TEAM PROGRESS NOTE   HISTORY Lynn Gentry is an 43 y.o. female with no previously documented medical disorder who was brought to [facility] with new onset slurred speech and left-sided weakness. Patient went to work as usual until 1500. She was found by her son at home at 1600 unable to get up out of a chair with left sided weakness, slurred speech. Patient was last known well around 3 PM this afternoon 02/21/2014. CT scan of her head showed an acute 4.1 x 1.5 cm right basal ganglia hemorrhage with mild mass effect and right to left shift of 5 mm. Patient's blood pressure was markedly elevated at 226/111. She was started on Cardene drip and blood pressure responded well. She has shown continual respiratory difficulty. She was initially given oxygen by nasal cannula followed by NBR, and subsequently placed on BiPAP. She continued to have oxygen saturations only in the high 80s to low 90s on 100% O2, with a respiratory rates in the 40s. ABG showed pH of 2.82, PCO2 37 PO2 of 57. Chest x-ray showed mild cardiomegaly with possible asymmetric pulmonary edema. BNP was 36,455. Because of deteriorating respiratory status intubation and mechanical ventilation was elected. CCM service was consulted. Patient was not administered TPA secondary to ICH. She was admitted to the neuro ICU  for further evaluation and treatment.   SUBJECTIVE (INTERVAL HISTORY) No family is at bedside. Clinically no significant change. Still on vent, not able to wean off went. Pt is sweating and RT is adjusting vent settings. CTA done showed no evidence of venous sinus thrombosis although right dominant transverse sinus. Agree with palliative care consult and she has poor prognosis.  OBJECTIVE Temp:  [97.8 F (36.6 C)-99.2 F (37.3 C)] 98.6 F (37 C) (10/29 1635) Pulse Rate:  [63-108] 74 (10/29 1635) Cardiac Rhythm:  [-] Sinus tachycardia (10/29 0700) Resp:  [15-32] 15 (10/29 1635) BP: (114-173)/(56-92) 119/65 mmHg  (10/29 1635) SpO2:  [98 %-100 %] 98 % (10/29 1635) FiO2 (%):  [40 %] 40 % (10/29 1635) Weight:  [227 lb 1.2 oz (103 kg)] 227 lb 1.2 oz (103 kg) (10/29 0428)   Recent Labs Lab 03/16/14 0031 03/16/14 0406 03/16/14 0737 03/16/14 1137 03/16/14 1634  GLUCAP 130* 140* 116* 110* 114*    Recent Labs Lab 03/12/14 0330 03/13/14 0340 03/14/14 0430 03/15/14 0405 03/16/14 0500  NA 139 139 139  142 140 138  K 3.8 4.1 4.3  4.4 4.1 4.0  CL 94* 95* 94*  96 96 94*  CO2 27 26 24  25 26 25   GLUCOSE 113* 127* 105*  103* 115* 116*  BUN 55* 72* 94*  93* 46* 31*  CREATININE 7.27* 8.63* 10.47*  10.22* 6.14* 5.39*  CALCIUM 9.1 9.3 9.2  9.2 9.2 9.7  MG  --   --   --  2.3 2.3  PHOS 6.1* 7.0* 8.3* 5.6* 5.0*    Recent Labs Lab 03/12/14 0330 03/13/14 0340 03/14/14 0430 03/15/14 0405 03/16/14 0500  ALBUMIN 2.4* 2.5* 2.7* 2.8* 3.0*    Recent Labs Lab 03/12/14 0735 03/13/14 0340 03/14/14 0430 03/15/14 0405 03/16/14 0500  WBC 8.9 9.4 9.5 8.6 10.5  HGB 7.6* 8.6* 8.7* 9.3* 10.1*  HCT 24.8* 27.9* 28.1* 30.7* 33.2*  MCV 91.5 91.2 91.5 93.6 95.4  PLT 342 401* 398 346 333   No results found for this basename: CKTOTAL, CKMB, CKMBINDEX, TROPONINI,  in the last 168 hours  Recent Labs  03/14/14 0430  LABPROT 14.8  INR 1.15  No results found for this basename: COLORURINE, APPERANCEUR, LABSPEC, PHURINE, GLUCOSEU, HGBUR, BILIRUBINUR, KETONESUR, PROTEINUR, UROBILINOGEN, NITRITE, LEUKOCYTESUR,  in the last 72 hours     Component Value Date/Time   CHOL 177 02/28/2014 0350   Lab Results  Component Value Date   HGBA1C 5.8* 02/23/2014   No results found for this basename: labopia,  cocainscrnur,  labbenz,  amphetmu,  thcu,  labbarb    No results found for this basename: ETH,  in the last 168 hours  I have personally reviewed the radiological images below and agree with the radiology interpretations.  Ct Head Wo Contrast 02/28/14 - Stable 4.2 x 1.1 cm right lenticular nucleus  hematoma with mild  surrounding vasogenic edema. Local mass effect upon the right  lateral ventricle with slight bowing of the septum to the left by  3.9 mm without significant change.  This may represent result of hypertensive hemorrhage and can be  assessed as this clears.  Prominent white matter type changes asymmetric greater on the left  more conspicuous on the present exam than on the prior exam. The  patient may benefit from followup MR and MR angiogram for further  delineation. This may be ischemic in origin possibly related to  watershed type infarct. Other causes not excluded although felt to  be less likely.  02/24/2014 Stable appearance of the hematoma within the right basal ganglia. No new or progressive findings.  02/22/2014   1. No significant interval change in the patient's intraparenchymal hemorrhage at the right lateral basal ganglia, measuring 4.1 x 1.2 cm, with mild surrounding vasogenic edema. Mild associated mass effect, with 4 mm of leftward midline shift again seen. 2. Scattered small vessel ischemic microangiopathy. 3. Bilateral proptosis noted.     02/21/2014   1. Acute hemorrhage into the lateral right basal ganglia, likely indicating a hypertensive hemorrhage/hemorrhagic stroke. 2. Mild mass effect with shift of the midline to the left approximating 5 mm. Critical   US Renal Port 02/22/2014    1. Small echogenic kidneys consistent with chronic renal medical disease. No hydronephrosis. 2. The urinary bladder is decompressed by Foley catheter and cannot be evaluated.     Dg Chest Port 1 View 02/22/2014    1. Satisfactory positioning of endotracheal tube and right IJ line. 2. Cardiomegaly with persistent diffuse interstitial and airspace disease likely reflecting edema and congestive heart failure. 3. Bilateral pleural effusions and basilar airspace disease. This likely reflects atelectasis.    02/21/2014    Mild cardiac enlargement and possible asymmetric pulmonary edema.  However, exam is limited by body habitus.     2D echo - - Left ventricle: The cavity size was normal. There was severe concentric hypertrophy. Systolic function was normal. The estimated ejection fraction was in the range of 60% to 65%. Wall motion was normal; there were no regional wall motion abnormalities. - Left atrium: The atrium was mildly dilated. - Pulmonary arteries: PA peak pressure: 43 mm Hg (S). - Pericardium, extracardiac: A small, free-flowing pericardial effusion was identified posterior to the heart and circumferential to the heart. The fluid had no internal echoes.There was no evidence of hemodynamic compromise. Impressions: - The right ventricular systolic pressure was increased consistent with moderate pulmonary hypertension.  CUS - - Findings consistent with 1-39 percent stenosis involving the right internal carotid artery and the left internal carotid artery. - Right vertebral artery not imaged due to central line dressing. Left vertebral artery not imaged due to patient position.  DVT - - No obvious  evidence of deep vein or superficial thrombosis involving the right lower extremity and left lower extremity. - No evidence of Baker&'s cyst on the right or left.  EEG 02/28/14 - This EEG is abnormal with severe generalized slowing of cerebral activity consistent with severe encephalopathic state. These findings are nonspecific and can be seen with metabolic as well as toxic and degenerative encephalopathies. No evidence of an epileptic disorder was demonstrated.  MRI Brain 03/08/2014 1. Stable size and morphology of 4.5 x 1.4 x 2.8 cm subacute  intraparenchymal hematoma involving the right lentiform nucleus with  associated vasogenic edema and mass effect on the right lateral  ventricle. There is persistent 4 mm of right-to-left midline shift.  2. Acute left MCA territory infarct involving the deep white matter  of the left frontoparietal lobes. This is largely  watershed in  distribution.  3. Additional small infarcts within the deep white matter of the  right frontal lobe, right parieto-occipital region, and left pons.   MRA HEAD 03/08/14 1. Multi focal irregularity with associated stenoses within the  supra clinoid left ICA, left M1 segment, and possibly left A1  segment as above. Possible vasculitis or drug exposure could be  considered  Four vessel cerebral angiogram 03/14/14 1.Occluded RT ACA A1prox.  2.Severe focal stenosis of LT MCA M1 prox to trifurcation.  3. Approx 50 % stenosis LT ICA suprclinoid seg.  4. Approx 30 to 50 % stenosis Rt I CA supraclnoid seg.  5. 30 to 50 % stenosis RT MCA prox.Marland Kitchen  CTA head with venous phase 03/16/14 IMPRESSION:  1. Circle of Willis appearance stable to the MRA on 03/08/2014, with  moderate anterior circulation stenoses of the left M1 segment, right  MCA origin, and near occlusion of the left ACA origin. The left A2  is adequately reconstituted by the anterior communicating artery.  Negative posterior arterial circulation.  2. CT venogram with no evidence of venous sinus thrombosis.  3. Expected evolution of right deep gray matter hemorrhage.  4. Progressed hypodensity tracking from the left internal capsule to  the brainstem, favor Wallerian degeneration.   A1C 5.8 and LDL 92  PHYSICAL EXAM Young obese Caucasian lady not in distress. On trach. Afebrile. Head is nontraumatic. Neck is supple without bruit. Cardiac exam soft ejection murmur RRR .no gallop. Lungs bilateral crackles and  rhonchi to auscultation. Distal pulses are well felt.   Neurological Exam: still on ventilation with trach, eyes open on voice but not tracking and not following commands. Eyes in the right side gaze position. Pupils 3 mm reactive. Fundi not visualized. Left lower face weakness, corneal and gag present. Spontaneously moving LUE and Withdraw to LLE on pain stimulation. Not moving RUE and trace withdraw to RLE.  Decorticate posture. Increased muscle tone on bilateral UEs and RLE. Bilateral babinski down going.  ASSESSMENT/PLAN Lynn Gentry is a 43 y.o. female with no documented medical history found with new onset slurred speech and left-sided weakness. CT showed right external capsule hemorrhage. However, her condition getting worse with pulmonary edema, AKI requiring HD, respiratory failure needing ventilation. Transferred pt to CCM for better management. Days after admission, pt was found to not moving on the right, CT at that time no change but later repeat CT showed left subcortical hypodensity. MRI not able to perform initially due to not able to lie flat. But recently done showed extensive left subcortical stroke as well as punctate right frontal deep WM and right parietoccipital region as well as left pons.  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   Systemic vasculitis labs all negative. Due to her general neurological and medical condition she is not a candidate at the present time for brain biopsy for definitive diagnosis of CNS vasculitis.Plan to check urine drug screen but patient is anuric hence unable to do so.  Cerebral angiogram did not show pattern of vasculitis.  CTA showed no evidence of venous sinus thrombosis  EEG showed severe slowing but no seizure.  2D echo unremarkable  Carotid doppler unremarkable  HgbA1c 5.8  Heparin subq for VTE prophylaxis  Resultant VDRF, vegetative state, dense right hemiplegia with spasticity  and mild left hemiparesis, decorticate posture  no antithrombotics prior to admission, currently not on antithrombotics due to hemorrhage.  Agree with palliative care consult and pt has very poor prognosis, may not be able wean of ventilation and may be heading to persistant vegetative state.  Acute Respiratory Failure with severe acute pulmonary edema  trach on 03/02/14  CCM following  Diuresing, IV Lasix, Hemodialysis, urine output minimal  CXR series  Malignant Hypertension  BP 226/111 at onset  Home meds:   none  SBP goal < 150 due to presence of end organ damage  On amlodipine, clonidine patch and metoprolol and hydralazine and enalapril  off cardene   BP stabilized  Acute renal failure with volume overload, suspect baseline chronic kidney disease  Diuresis and management per critical care, on HD  Continue to trend Cre.  HD as per nephrology  Other Stroke Risk Factors ETOH use   Morbid Obesity, Body mass index is 38.96 kg/(m^2).   Hospital day # 23 03/16/2014 5:04 PM   At this time, no additional neuro work up or input will be added. We will sign off and please call us back if new issues arise. Thank you so much for the opportunity in taking care of Lynn Gentry.  Marvel Plan, MD PhD Stroke Neurology 03/16/2014 5:04 PM   To contact Stroke Continuity provider, please refer to [email or web address]. After hours, contact General Neurology

## 2014-03-17 LAB — RENAL FUNCTION PANEL
Albumin: 3.1 g/dL — ABNORMAL LOW (ref 3.5–5.2)
Anion gap: 19 — ABNORMAL HIGH (ref 5–15)
BUN: 47 mg/dL — AB (ref 6–23)
CALCIUM: 9.7 mg/dL (ref 8.4–10.5)
CO2: 25 meq/L (ref 19–32)
CREATININE: 7.82 mg/dL — AB (ref 0.50–1.10)
Chloride: 97 mEq/L (ref 96–112)
GFR calc Af Amer: 7 mL/min — ABNORMAL LOW (ref >90)
GFR calc non Af Amer: 6 mL/min — ABNORMAL LOW (ref >90)
GLUCOSE: 119 mg/dL — AB (ref 70–99)
Phosphorus: 6.5 mg/dL — ABNORMAL HIGH (ref 2.3–4.6)
Potassium: 3.9 mEq/L (ref 3.7–5.3)
Sodium: 141 mEq/L (ref 137–147)

## 2014-03-17 LAB — CBC
HCT: 32.3 % — ABNORMAL LOW (ref 36.0–46.0)
HEMOGLOBIN: 9.9 g/dL — AB (ref 12.0–15.0)
MCH: 28.4 pg (ref 26.0–34.0)
MCHC: 30.7 g/dL (ref 30.0–36.0)
MCV: 92.8 fL (ref 78.0–100.0)
Platelets: 359 10*3/uL (ref 150–400)
RBC: 3.48 MIL/uL — AB (ref 3.87–5.11)
RDW: 20.2 % — ABNORMAL HIGH (ref 11.5–15.5)
WBC: 9.9 10*3/uL (ref 4.0–10.5)

## 2014-03-17 LAB — GLUCOSE, CAPILLARY
GLUCOSE-CAPILLARY: 107 mg/dL — AB (ref 70–99)
GLUCOSE-CAPILLARY: 154 mg/dL — AB (ref 70–99)
GLUCOSE-CAPILLARY: 116 mg/dL — AB (ref 70–99)
Glucose-Capillary: 107 mg/dL — ABNORMAL HIGH (ref 70–99)
Glucose-Capillary: 118 mg/dL — ABNORMAL HIGH (ref 70–99)
Glucose-Capillary: 123 mg/dL — ABNORMAL HIGH (ref 70–99)

## 2014-03-17 MED ORDER — SODIUM CHLORIDE 0.9 % IV SOLN: 100.0000 mL | INTRAVENOUS | Status: DC | PRN

## 2014-03-17 MED ORDER — LIDOCAINE-PRILOCAINE 2.5-2.5 % EX CREA
1.0000 "application " | TOPICAL_CREAM | CUTANEOUS | Status: DC | PRN
  Filled 2014-03-17: qty 5

## 2014-03-17 MED ORDER — ALTEPLASE 2 MG IJ SOLR
2.0000 mg | Freq: Once | INTRAMUSCULAR | Status: AC | PRN
Start: 2014-03-17 — End: 2014-03-17
  Filled 2014-03-17: qty 2

## 2014-03-17 MED ORDER — HEPARIN SODIUM (PORCINE) 1000 UNIT/ML DIALYSIS
1000.0000 [IU] | INTRAMUSCULAR | Status: DC | PRN
  Filled 2014-03-17: qty 1

## 2014-03-17 MED ORDER — NEPRO/CARBSTEADY PO LIQD: 237.0000 mL | ORAL | Status: DC | PRN

## 2014-03-17 MED ORDER — LIDOCAINE HCL (PF) 1 % IJ SOLN: 5.0000 mL | INTRAMUSCULAR | Status: DC | PRN

## 2014-03-17 MED ORDER — NEPRO/CARBSTEADY PO LIQD: 1000.0000 mL | ORAL | Status: DC

## 2014-03-17 MED ORDER — MIDAZOLAM HCL 2 MG/2ML IJ SOLN: 1.0000 mg | INTRAMUSCULAR | Status: DC | PRN

## 2014-03-17 MED ORDER — PENTAFLUOROPROP-TETRAFLUOROETH EX AERO: 1.0000 "application " | INHALATION_SPRAY | CUTANEOUS | Status: DC | PRN

## 2014-03-17 NOTE — Progress Notes (Signed)
PULMONARY / CRITICAL CARE MEDICINE   Name: Lynn NationsStephanie Heyde MRN: [id number] DOB: 16-Apr-1971    ADMISSION DATE:  02/21/2014 CONSULTATION DATE:  03/17/2014  REFERRING MD :  Pearlean BrownieSethi  CHIEF COMPLAINT:  Respiratory failure in setting of ICH  INITIAL PRESENTATION: 43 y.[facility]. F [facility] with left sided weakness, slurred speech, inability to stand up.  In ED, found to have acute lateral right basal ganglia ICH with mild mass effect and 5mm MLS.  Admitted to neuro ICU and developed acute respiratory failure, failed bipap and PCCM called for intubation and management. She was admitted to neuro ICU and later that evening had increasing O2 demands.  She had no improvement in SpO2, work of breathing, and mental status despite 3 hours of BiPAP.  PCCM was consulted for intubation.  STUDIES:  10/06 CT head:  acute hemorrhage into the lateral right basal ganglia, likely indicating a hypertensive hemorrhage/hemorrhagic stroke.  Mild mass effect with 5mm MLS to the left. 10/07 Renal US:  echogenic kidneys s/w ckd, no hydro.  [facility]07 Doppler carotid: consistent with 1-39% stenosis involving the R internal carotid art and the L internal carotid art 10/07 TTE: LVEF 60-65%. Severe concentric LVH. LA dilatation. PASP est 43 mmHg [facility] LE venous Dopplers (ordered by Stroke) >> negative 10/21 MRI / MRA brain >> stable subacute intraparenchymal hemorrhage, localized vasogenic edema, mild mass effect, 4 mm R to L shift, acute L MCA infarct, largely watershed in distribution, additional small infarcts in deep white matter  SIGNIFICANT EVENTS: 10/06  Admitted for ICH, developed respiratory failure requiring intubation. 10/07  Persistent hypertension. Remained on nicardipine gtt. Persistent volume overload snd pulm edema pattern on CXR. Lasix gtt resulted in minimal increase in Uo 10/08  Worsening renal function, oliguria. Renal consult. HD  performed 10/09  Acute change in neuro status. STAT [facility]  Further HD planned 10/14  HD with negative fluid balance 10/15  Trach (JY) 10/16  Febrile and purulent matter from nasal cavity and trach. 10/17  Transition from fentanyl gtt to Duragesic patch. Begin PSV weaning 10/18  Transfer to vent SDU bed ordered. Tolerating PSV 10 cm H2O 10/19  Transfer to SDU/2900 10/21  Off vent 36hours, but went back on in evening for secretions/tachypnea; Persistent diarrhea, d/c'd senokot, [place] 10/22  Mucus plugging this AM; Neurology had discussion with family explaining dismal prognosis, family desires full code 10/23- active pall care discussions 10/25- pall care meeting not much progress  SUBJ - More diaphoretic this AM, HR and BP up.  VITAL SIGNS: Temp:  [98.6 F (37 C)-99.7 F (37.6 C)] 98.9 F (37.2 C) (10/30 0745) Pulse Rate:  [63-133] 127 (10/30 1115) Resp:  [15-35] 35 (10/30 1115) BP: (92-174)/(44-98) 122/83 mmHg (10/30 1115) SpO2:  [93 %-100 %] 99 % (10/30 1115) FiO2 (%):  [40 %] 40 % (10/30 0732) Weight:  [103 kg (227 lb 1.2 oz)-105.8 kg (233 lb 4 oz)] 103 kg (227 lb 1.2 oz) (10/30 0708)  VENTILATOR SETTINGS: Vent Mode:  [-] PRVC FiO2 (%):  [40 %] 40 % Set Rate:  [15 bmp] 15 bmp Vt Set:  [500 mL] 500 mL PEEP:  [5 cmH20] 5 cmH20 Plateau Pressure:  [19 cmH20-23 cmH20] 23 cmH20  INTAKE / OUTPUT: Intake/Output     10/29 0701 - 10/30 0700 10/30 0701 - 10/31 0700   I.V. (mL/kg)  NG/GT 100    Total Intake(mL/kg) 100 (0.9)    Other     Total Output       Net +100           PHYSICAL EXAMINATION:  Gen: only opens eyes to voice that's it, no other responses HEENT: Trach site clean PULM: ronchi mild diffuse CV: distant tones RRR, no mgr AB: BS+, soft, nontender, PEG Ext: 2 plus  edema Neuro: opens eyes to voice, no movement of extremities on my exam, no changes  LABS:  I have reviewed all of  today's lab results. Relevant abnormalities are discussed in the A/P section  10/22 CXR > stable bilateral airspace disease  ASSESSMENT / PLAN:  PULMONARY ETT 10/6 >> 10/15 Trach tube 10/15 >>  A: Acute hypoxemic respiratory failure > nearly completely off vent support HCAP> MSSA Mucus plugging 10/22 Edema component P:   Guaifenesin Chest PT as able Unable to wean, needs vent SNF. Cont vent bundle Neg balance on HD  CARDIOVASCULAR CVL R IJ 10/6 >>  A:  HTN persistent Diastolic CHF > currently mild volume overload P:  Unable to remove cvl due to poor IV access, and no PICC given renal function. Continue hydralazine, clonidine, metoprolol, norvasc, enalapril Follow BP after HD in am  PRN hydralazine to maintain SBP < 170 mmHg PRN metoprolol to maintain HR < 115/min  RENAL L IJ HD cath 10/08 >> 10/20 Perm Cath L [place] 10/20 >> A:   Acute renal failure> now ESRD  P:   kvo HD today  GASTROINTESTINAL A:   Obesity P:   SUP: Pantoprazole Cont TFs tolerated well  HEMATOLOGIC A:   ICU acquired anemia P:  DVT px: SQ heparin Monitor CBC intermittently and after neg balance hemoconcetration Nephrology considering feraheme Transfuse per usual ICU guidelines  INFECTIOUS A:   HCAP > MSSA P:   Resp 10/17 >> Abundant staph aureus MSSA Blood 10/16 >> ng C.diff 10/20 >> neg  Fluconazole 10/15 >> 10/17 Vancomycin 10/16 >> 10/22 Zosyn 10/16 >> 10/20 Ancef 10/21 >> MWF with HD plan will plan of 10 days  ENDOCRINE A:   Hyperglycemia, controlled P:   CBG's q4hr Cont SSI  NEUROLOGIC A:   Hypertensive R basal Ganglion Hemorrhage  Acute L MCA stroke Effective quadriplegic at this point Acute Encephalopathy due to strokes > overall prognosis is grim with little chance of meaningful neurologic recovery  P:   Mgmt per Stroke team, angio done and pending results, will defer to neuro. Cont PRN fentanyl  RASS goal: 0 D/w pall care, family NOT realistic at  all Add IV versed PRN.  FAMILY: 10/30 no family bedside.   TODAY'S SUMMARY: More diaphoretic and tachy with weaning, will add PRN versed for anxiety.   Alyson Reedy, M.D. [facility] Pulmonary/Critical Care Medicine. Pager: [phone]. After hours pager: [phone].

## 2014-03-17 NOTE — Progress Notes (Signed)
Patient ID: Lynn Gentry, female    DOB: 03/12/1971, 43 y.o.   MRN: [id number] Nephrology Progress Note  S: nonresponsive  O:BP 173/95  Pulse 110  Temp(Src) 99.4 F (37.4 C) (Axillary)  Resp 27  Ht 5\' 4"  (1.626 m)  Wt 227 lb 1.2 oz (103 kg)  BMI 38.96 kg/m2  SpO2 100%  LMP 03/01/2014  Intake/Output Summary (Last 24 hours) at 03/17/14 0750 Last data filed at 03/16/14 1200  Gross per 24 hour  Intake    100 ml  Output      0 ml  Net    100 ml   Intake/Output: I/O last 3 completed shifts: In: 340 [I.V.:60; NG/GT:280] Out: -   Intake/Output this shift:    Weight change: -4 lb 13.6 oz (-2.2 kg) Gen: NAD  HEENT: pupils dilated bilaterally, leftward gaze CVS: RRR, 2/6 holosys M  Resp: trach collar, rhonchi bilat Abd: soft, obese, +BS Liver down 5 cm  Ext: 1+ pedal edema  Neuro: opens eyes to tactile and verbal stimuli. Raises left hand, moved left foot  Skin: diaphoretic Access: IJ cath   Recent Labs Lab 03/11/14 0345 03/12/14 0330 03/13/14 0340 03/14/14 0430 03/15/14 0405 03/16/14 0500 03/17/14 0525  NA 137 139 139 139  142 140 138 141  K 4.2 3.8 4.1 4.3  4.4 4.1 4.0 3.9  CL 95* 94* 95* 94*  96 96 94* 97  CO2 26 27 26 24  25 26 25 25   GLUCOSE 109* 113* 127* 105*  103* 115* 116* 119*  BUN 38* 55* 72* 94*  93* 46* 31* 47*  CREATININE 5.10* 7.27* 8.63* 10.47*  10.22* 6.14* 5.39* 7.82*  ALBUMIN 2.4* 2.4* 2.5* 2.7* 2.8* 3.0* 3.1*  CALCIUM 9.1 9.1 9.3 9.2  9.2 9.2 9.7 9.7  PHOS 4.9* 6.1* 7.0* 8.3* 5.6* 5.0* 6.5*   Liver Function Tests:  Recent Labs Lab 03/15/14 0405 03/16/14 0500 03/17/14 0525  ALBUMIN 2.8* 3.0* 3.1*   No results found for this basename: LIPASE, AMYLASE,  in the last 168 hours No results found for this basename: AMMONIA,  in the last 168 hours CBC:  Recent Labs Lab 03/12/14 0735 03/13/14 0340 03/14/14 0430 03/15/14 0405 03/16/14 0500  WBC 8.9 9.4 9.5 8.6 10.5  HGB 7.6* 8.6* 8.7* 9.3* 10.1*  HCT 24.8* 27.9* 28.1* 30.7*  33.2*  MCV 91.5 91.2 91.5 93.6 95.4  PLT 342 401* 398 346 333   Cardiac Enzymes: No results found for this basename: CKTOTAL, CKMB, CKMBINDEX, TROPONINI,  in the last 168 hours CBG:  Recent Labs Lab 03/16/14 1137 03/16/14 1634 03/16/14 1937 03/16/14 2344 03/17/14 0331  GLUCAP 110* 114* 115* 127* 107*    Iron Studies: No results found for this basename: IRON, TIBC, TRANSFERRIN, FERRITIN,  in the last 72 hours Studies/Results: Ct Angio Head W/cm &/or Wo Cm  03/16/2014   CLINICAL DATA:  43 year old female patient on dialysis for less than 6 months with recent right hemisphere hemorrhage and bihemispheric ischemic strokes. Initial encounter. Query venous sinus thrombosis.  EXAM: CT ANGIOGRAPHY HEAD  TECHNIQUE: Multidetector CT imaging of the head was performed using the standard protocol during bolus administration of intravenous contrast. Multiplanar CT image reconstructions and MIPs were obtained to evaluate the vascular anatomy.  CONTRAST:  50mL OMNIPAQUE IOHEXOL 350 MG/ML SOLN  COMPARISON:  Cerebral angiogram 03/14/2014. Brain MRI and MRA 03/08/2014, and earlier.  FINDINGS: Expected evolution of the right deep gray matter intra-axial hemorrhage since 02/28/2014. Patchy and confluent bilateral cerebral white matter hypodensity. No  significant intracranial mass effect. No ventriculomegaly. No new intracranial hemorrhage identified. No suspicious enhancement, there is mild rim enhancement of the residual intra-axial hematoma. There is progressive hypodensity tracking from the posterior limb of the left internal capsule through to the left pons. 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  No acute osseous abnormality identified. Visualized paranasal sinuses and mastoids are clear. Visualized orbits and scalp soft tissues are within normal limits.  VASCULAR FINDINGS:  ARTERIAL  Codominant distal vertebral arteries are normal. Normal vertebrobasilar junction. Normal left PICA and dominant right AICA  origins. No basilar artery stenosis. SCA and PCA origins are within normal limits. Posterior communicating arteries are diminutive or absent. Bilateral PCA branches are within normal limits.  Patent ICA siphons.  No siphon stenosis.  Patent right ICA terminus.  Right ACA origin is normal. There is moderate stenosis at the right MCA origin. There is mild right M1 segment irregularity. No major right MCA branch stenosis or occlusion identified.  Patent left ICA terminus and left MCA origin but near complete occlusion of the left ACA origin. Enhancement of a thread-like 81 segment. Anterior communicating artery is present. Bilateral 8 to ACA segments are within normal limits. More distal bilateral ACA branches are within normal limits.  Patent but irregular left MCA origin and left M1 segments with up to moderate M1 segment stenosis (series 5011, image 10). Left MCA bifurcation remains patent. No major left MCA branch stenosis or occlusion identified.  VENOUS  Additional delayed CT V images were obtained (series 601 and series 602). These demonstrate a diminutive but patent superior sagittal sinus. Patent torcula. Dominant right transverse sinus which is patent. Dominant right sigmoid sinus and right IJ bulb which are patent. Non dominant but patent appearing left transverse and sigmoid sinuses. Patent straight sinus, vein of Galen, internal cerebral veins, and left greater than right basal veins of Rosenthal.  Review of the MIP images confirms the above findings.  IMPRESSION: 1. Circle of Willis appearance stable to the MRA on 03/08/2014, with moderate anterior circulation stenoses of the left M1 segment, right MCA origin, and near occlusion of the left ACA origin. The left A2 is adequately reconstituted by the anterior communicating artery. Negative posterior arterial circulation. 2. CT venogram with no evidence of venous sinus thrombosis. 3. Expected evolution of right deep gray matter hemorrhage. 4. Progressed  hypodensity tracking from the left internal capsule to the brainstem, favor Wallerian degeneration.   Electronically Signed   By: Augusto Gamble M.D.   On: 03/16/2014 16:24   . amLODipine  10 mg Per Tube QHS  . antiseptic oral rinse  7 mL Mouth Rinse QID  . chlorhexidine  15 mL Mouth Rinse BID  . darbepoetin (ARANESP) injection - DIALYSIS  100 mcg Intravenous Q Mon-HD  . feeding supplement (NEPRO CARB STEADY)  1,000 mL Per Tube Q24H  . ferric gluconate (FERRLECIT/NULECIT) IV  125 mg Intravenous Q M,W,F-HD  . guaiFENesin  10 mL Per Tube TID  . heparin subcutaneous  5,000 Units Subcutaneous 3 times per day  . hydrALAZINE  50 mg Per Tube 3 times per day  . insulin aspart  0-15 Units Subcutaneous 6 times per day  . metoprolol tartrate  50 mg Per Tube BID  . multivitamin  5 mL Per Tube Daily  . pantoprazole sodium  40 mg Per Tube Q1200  . scopolamine  1 patch Transdermal Q72H    BMET    Component Value Date/Time   NA 141 03/17/2014 0525   K 3.9  03/17/2014 0525   CL 97 03/17/2014 0525   CO2 25 03/17/2014 0525   GLUCOSE 119* 03/17/2014 0525   BUN 47* 03/17/2014 0525   CREATININE 7.82* 03/17/2014 0525   CALCIUM 9.7 03/17/2014 0525   GFRNONAA 6* 03/17/2014 0525   GFRAA 7* 03/17/2014 0525   CBC    Component Value Date/Time   WBC 10.5 03/16/2014 0500   RBC 3.48* 03/16/2014 0500   HGB 10.1* 03/16/2014 0500   HCT 33.2* 03/16/2014 0500   PLT 333 03/16/2014 0500   MCV 95.4 03/16/2014 0500   MCH 29.0 03/16/2014 0500   MCHC 30.4 03/16/2014 0500   RDW 20.2* 03/16/2014 0500   LYMPHSABS 1.3 03/08/2014 0445   MONOABS 1.3* 03/08/2014 0445   EOSABS 0.2 03/08/2014 0445   BASOSABS 0.1 03/08/2014 0445    Assessment/Plan: 1. ESRD: MWF. Need PC due to risk of temp cath for infx 2. Vascular access: will need TDC placed early next week 3. 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 VDRF: will try to get additional fluid off with HD to assist with resp  status 6. CVA: no improvement, vegetative state currently. 7. HTN 8. Poor QOL, not likely to get off HD, and nonreversible neuro deficits.  ??? plan  [name] 7:50 AM I have seen and examined this patient and agree with the plan of care seen, eval, examined, discussed with residents. .  Burlie Cajamarca L 03/17/2014, 1:22 PM

## 2014-03-17 NOTE — Procedures (Signed)
I was present at this session.  I have reviewed the session itself and made appropriate changes.   HD via temp cath , needs perm cath.  bp ^ to start, lower vol.  Nachmen Mansel L 10/30/20157:34 AM

## 2014-03-17 NOTE — Progress Notes (Addendum)
Patient Lynn Gentry      DOB: 04/18/1971      SNK:[id number]  No family in waiting room or at bedside today. They are awaiting to hear back from [facility]. It has not been my experience that patients with long term vent/dialysis needs can go to [facility] and usually there are only a few places in country that can do this. Will see what happens with case management/insurance/[facility].  Family will almost certainly continue to pursue any and all life-prolonging interventions offered.  It would be reasonable to explore futility policy if all physicians providing care agree.  This would take ethics consultation, exploration of transfer to another physician/facility, etc.  Certainly no provider should feel obligated to provide care they feel inappropriate.  From my review of chart, she has not made any significant neuro recovery and her long term prognosis is dismal.    I will follow-up with family Monday.  Again, no medical decisions should be pending my discussions with family, as I feel there is a low probability they will alter their wishes for [place].   Orvis Brill D.O. Palliative Medicine Team at [facility]  Pager: [phone] Team Phone: [phone]

## 2014-03-17 NOTE — Progress Notes (Signed)
RN called RT to pt room due to inc HR.  Observed pt HR 130's, very diaphoretic.  Asessed pt .  Asked nurse if pt could possibly be in pain.  She said she would go assess pt.

## 2014-03-18 LAB — RENAL FUNCTION PANEL
ALBUMIN: 3.5 g/dL (ref 3.5–5.2)
ANION GAP: 19 — AB (ref 5–15)
BUN: 37 mg/dL — ABNORMAL HIGH (ref 6–23)
CHLORIDE: 96 meq/L (ref 96–112)
CO2: 26 mEq/L (ref 19–32)
Calcium: 10.2 mg/dL (ref 8.4–10.5)
Creatinine, Ser: 6.31 mg/dL — ABNORMAL HIGH (ref 0.50–1.10)
GFR calc Af Amer: 8 mL/min — ABNORMAL LOW (ref >90)
GFR calc non Af Amer: 7 mL/min — ABNORMAL LOW (ref >90)
Glucose, Bld: 115 mg/dL — ABNORMAL HIGH (ref 70–99)
POTASSIUM: 4.3 meq/L (ref 3.7–5.3)
Phosphorus: 5.9 mg/dL — ABNORMAL HIGH (ref 2.3–4.6)
Sodium: 141 mEq/L (ref 137–147)

## 2014-03-18 LAB — GLUCOSE, CAPILLARY
Glucose-Capillary: 114 mg/dL — ABNORMAL HIGH (ref 70–99)
Glucose-Capillary: 117 mg/dL — ABNORMAL HIGH (ref 70–99)
Glucose-Capillary: 118 mg/dL — ABNORMAL HIGH (ref 70–99)
Glucose-Capillary: 120 mg/dL — ABNORMAL HIGH (ref 70–99)
Glucose-Capillary: 127 mg/dL — ABNORMAL HIGH (ref 70–99)
Glucose-Capillary: 128 mg/dL — ABNORMAL HIGH (ref 70–99)

## 2014-03-18 MED ORDER — DEXTROSE 5 % IV SOLN
1.5000 g | INTRAVENOUS | Status: AC
  Administered 2014-03-20: 1.5 g via INTRAVENOUS
  Filled 2014-03-18: qty 1.5

## 2014-03-18 NOTE — Progress Notes (Signed)
PULMONARY / CRITICAL CARE MEDICINE   Name: Lynn Gentry MRN: [id number] DOB: [birth year]    ADMISSION DATE:  02/21/2014 CONSULTATION DATE:  03/18/2014  REFERRING MD :  Pearlean Brownie  CHIEF COMPLAINT:  Respiratory failure in setting of ICH  INITIAL PRESENTATION: 43 y.o. F brought to [facility] ED on 10/6 with left sided weakness, slurred speech, inability to stand up.  In ED, found to have acute lateral right basal ganglia ICH with mild mass effect and 24mm MLS.  Admitted to neuro ICU and developed acute respiratory failure, failed bipap and PCCM called for intubation and management. She was admitted to neuro ICU and later that evening had increasing O2 demands.  She had no improvement in SpO2, work of breathing, and mental status despite 3 hours of BiPAP.  PCCM was consulted for intubation.  STUDIES:  10/06 CT head:  acute hemorrhage into the lateral right basal ganglia, likely indicating a hypertensive hemorrhage/hemorrhagic stroke.  Mild mass effect with 51mm MLS to the left. 10/07 Renal US:  echogenic kidneys s/w ckd, no hydro.  10/07 CT head: [facility] 10/07 repeat CT head: [facility] 10/07 Doppler carotid: consistent with 1-39% stenosis involving the R internal carotid art and the L internal carotid art 10/07 TTE: LVEF 60-65%. Severe concentric LVH. LA dilatation. PASP est 43 mmHg 10/09 CT head: [facility] 10/09 LE venous Dopplers (ordered by Stroke) >> negative 10/21 MRI / MRA brain >> stable subacute intraparenchymal hemorrhage, localized vasogenic edema, mild mass effect, 4 mm R to L shift, acute L MCA infarct, largely watershed in distribution, additional small infarcts in deep white matter  SIGNIFICANT EVENTS: 10/06  Admitted for ICH, developed respiratory failure requiring intubation. 10/07  Persistent hypertension. Remained on nicardipine gtt. Persistent volume overload snd pulm edema pattern on CXR. Lasix gtt resulted in minimal increase in Uo 10/08  Worsening renal function, oliguria. Renal consult. HD  performed 10/09  Acute change in neuro status. STAT CT head ordered: [facility] 10/09  Further HD planned 10/14  HD with negative fluid balance 10/15  Trach (JY) 10/16  Febrile and purulent matter from nasal cavity and trach. 10/17  Transition from fentanyl gtt to Duragesic patch. Begin PSV weaning 10/18  Transfer to vent SDU bed ordered. Tolerating PSV 10 cm H2O 10/19  Transfer to SDU/2900 10/21  Off vent 36hours, but went back on in evening for secretions/tachypnea; Persistent diarrhea, d/c'd senokot, Family conversation from [place] explaining dismal prognosis 10/22  Mucus plugging this AM; Neurology had discussion with family explaining dismal prognosis, family desires full code 10/23- active pall care discussions 10/25- pall care meeting not much progress  SUBJECTIVE nad on vent  VITAL SIGNS: Temp:  [98.2 F (36.8 C)-100.3 F (37.9 C)] 98.2 F (36.8 C) (10/31 1200) Pulse Rate:  [72-99] 74 (10/31 1200) Resp:  [14-31] 21 (10/31 1200) BP: (91-136)/(52-77) 111/61 mmHg (10/31 1200) SpO2:  [99 %-100 %] 100 % (10/31 1200) FiO2 (%):  [40 %] 40 % (10/31 1119) Weight:  [219 lb 9.3 oz (99.6 kg)] 219 lb 9.3 oz (99.6 kg) (10/31 0500)  VENTILATOR SETTINGS: Vent Mode:  [-] PSV;PRVC FiO2 (%):  [40 %] 40 % Set Rate:  [15 bmp] 15 bmp Vt Set:  [500 mL] 500 mL PEEP:  [5 cmH20] 5 cmH20 Pressure Support:  [12 cmH20-15 cmH20] 12 cmH20 Plateau Pressure:  [15 cmH20-17 cmH20] 15 cmH20  INTAKE / OUTPUT: Intake/Output     10/30 0701 - 10/31 0700 10/31 0701 - 11/01 0700   NG/GT 20    IV Piggyback 220  Total Intake(mL/kg) 240 (2.4)    Other 3687    Total Output 3687     Net -3447           PHYSICAL EXAMINATION:  Gen: min opens eyes to verbal, no eye contact HEENT: Trach site clean PULM: ronchi mild diffuse CV: distant tones RRR, no mgr AB: BS+, soft, nontender, PEG Ext: 2 plus  edema Neuro: opens eyes to voice, no movement of extremities on my exam, no changes  LABS:   Recent  Labs Lab 03/16/14 0500 03/17/14 0525 03/18/14 0603  NA 138 141 141  K 4.0 3.9 4.3  CL 94* 97 96  CO2 25 25 26   BUN 31* 47* 37*  CREATININE 5.39* 7.82* 6.31*  GLUCOSE 116* 119* 115*    Recent Labs Lab 03/15/14 0405 03/16/14 0500 03/17/14 0800  HGB 9.3* 10.1* 9.9*  HCT 30.7* 33.2* 32.3*  WBC 8.6 10.5 9.9  PLT 346 333 359     Lab Results  Component Value Date   TSH 4.820* 03/13/2014     Lab Results  Component Value Date   PROBNP 36455.0* 02/21/2014     No results found for this basename: ESRSEDRATE, SEDRATE, POCTSEDRATE        ASSESSMENT / PLAN:  PULMONARY ETT 10/6 >> 10/15 Trach tube 10/15 >>  A: Acute hypoxemic respiratory failure > nearly completely off vent support HCAP> MSSA Mucus plugging 10/22 Edema component P:   Guaifenesin Chest PT as able Unable to wean, needs vent SNF. Cont vent bundle Neg balance on HD  CARDIOVASCULAR CVL R IJ 10/6 >>  A:  HTN persistent Diastolic CHF > currently mild volume overload P:  Unable to remove cvl due to poor IV access, and no PICC given renal function. Continue hydralazine, clonidine, metoprolol, norvasc, enalapril  PRN hydralazine to maintain SBP < 170 mmHg PRN metoprolol to maintain HR < 115/min  RENAL L IJ HD cath 10/08 >> 10/20 Perm Cath L [place] 10/20 >> A:   Acute renal failure> now ESRD  P:   kvo    GASTROINTESTINAL A:   Obesity P:   SUP: Pantoprazole Cont TFs tolerated well  HEMATOLOGIC A:   ICU acquired anemia P:  DVT px: SQ heparin Monitor CBC intermittently and after neg balance hemoconcetration Nephrology considering feraheme Transfuse per usual ICU guidelines  INFECTIOUS A:   HCAP > MSSA P:   Resp 10/17 >> Abundant staph aureus MSSA Blood 10/16 >> ng C.diff 10/20 >> neg  Fluconazole 10/15 >> 10/17 Vancomycin 10/16 >> 10/22 Zosyn 10/16 >> 10/20 Ancef 10/21 >> MWF with HD plan will plan of 10 days  ENDOCRINE A:   Hyperglycemia, controlled P:   CBG's q4hr Cont  SSI  NEUROLOGIC A:   Hypertensive R basal Ganglion Hemorrhage  Acute L MCA stroke Acute Encephalopathy due to strokes > overall prognosis is grim with little chance of meaningful neurologic recovery  P:    Cont PRN fentanyl  RASS goal: 0 D/w pall care, family NOT realistic at all Add IV versed PRN.  FAMILY: 10/31 no family bedside.   Discussed with nursing, no new concerns ?ethics of continuing HD if futile to a meaningful recovery    Sandrea HughsMichael Wert, MD Pulmonary and Critical Care Medicine [facility] Cell [phone] After 5:30 PM or weekends, call [phone]

## 2014-03-18 NOTE — Progress Notes (Signed)
Patient ID: Lynn Gentry, female   DOB: 1970/06/07, 43 y.o.   MRN: [id number] Plan insertion PermCath in OR on Monday per Dr. early

## 2014-03-18 NOTE — Progress Notes (Signed)
Patient ID: Lynn NationsStephanie Gentry, female    DOB: 1971/04/13, 43 y.o.   MRN: [id number] Nephrology Progress Note  S: no changes today, remains unresponsive  O:BP 119/64  Pulse 98  Temp(Src) 98.6 F (37 C) (Axillary)  Resp 22  Ht 5\' 4"  (1.626 m)  Wt 219 lb 9.3 oz (99.6 kg)  BMI 37.67 kg/m2  SpO2 100%  LMP 03/01/2014  Intake/Output Summary (Last 24 hours) at 03/18/14 1025 Last data filed at 03/18/14 0700  Gross per 24 hour  Intake     20 ml  Output   3687 ml  Net  -3667 ml   Intake/Output: I/O last 3 completed shifts: In: 240 [NG/GT:20; IV Piggyback:220] Out: 3687 [Other:3687]  Intake/Output this shift:    Weight change: -6 lb 2.8 oz (-2.8 kg) Gen: NAD  HEENT: pupils dilated bilaterally, leftward gaze CVS: RRR, 2/6 holosys M  Resp: trach collar, rhonchi bilat Abd: soft, obese, +BS Liver down 5 cm  Ext: 1+ pedal edema  Neuro: opens eyes to tactile and verbal stimuli. Raises left hand, moved left foot  Skin: diaphoretic Access: IJ cath   Recent Labs Lab 03/12/14 0330 03/13/14 0340 03/14/14 0430 03/15/14 0405 03/16/14 0500 03/17/14 0525 03/18/14 0603  NA 139 139 139  142 140 138 141 141  K 3.8 4.1 4.3  4.4 4.1 4.0 3.9 4.3  CL 94* 95* 94*  96 96 94* 97 96  CO2 27 26 24  25 26 25 25 26   GLUCOSE 113* 127* 105*  103* 115* 116* 119* 115*  BUN 55* 72* 94*  93* 46* 31* 47* 37*  CREATININE 7.27* 8.63* 10.47*  10.22* 6.14* 5.39* 7.82* 6.31*  ALBUMIN 2.4* 2.5* 2.7* 2.8* 3.0* 3.1* 3.5  CALCIUM 9.1 9.3 9.2  9.2 9.2 9.7 9.7 10.2  PHOS 6.1* 7.0* 8.3* 5.6* 5.0* 6.5* 5.9*   Liver Function Tests:  Recent Labs Lab 03/16/14 0500 03/17/14 0525 03/18/14 0603  ALBUMIN 3.0* 3.1* 3.5   No results found for this basename: LIPASE, AMYLASE,  in the last 168 hours No results found for this basename: AMMONIA,  in the last 168 hours CBC:  Recent Labs Lab 03/13/14 0340 03/14/14 0430 03/15/14 0405 03/16/14 0500 03/17/14 0800  WBC 9.4 9.5 8.6 10.5 9.9  HGB 8.6* 8.7* 9.3*  10.1* 9.9*  HCT 27.9* 28.1* 30.7* 33.2* 32.3*  MCV 91.2 91.5 93.6 95.4 92.8  PLT 401* 398 346 333 359   Cardiac Enzymes: No results found for this basename: CKTOTAL, CKMB, CKMBINDEX, TROPONINI,  in the last 168 hours CBG:  Recent Labs Lab 03/17/14 1625 03/17/14 1948 03/17/14 2337 03/18/14 0341 03/18/14 0736  GLUCAP 116* 123* 107* 127* 128*    Iron Studies: No results found for this basename: IRON, TIBC, TRANSFERRIN, FERRITIN,  in the last 72 hours Studies/Results: Ct Angio Head W/cm &/or Wo Cm  03/16/2014   CLINICAL DATA:  43 year old female patient on dialysis for less than 6 months with recent right hemisphere hemorrhage and bihemispheric ischemic strokes. Initial encounter. Query venous sinus thrombosis.  EXAM: CT ANGIOGRAPHY HEAD  TECHNIQUE: Multidetector CT imaging of the head was performed using the standard protocol during bolus administration of intravenous contrast. Multiplanar CT image reconstructions and MIPs were obtained to evaluate the vascular anatomy.  CONTRAST:  50mL OMNIPAQUE IOHEXOL 350 MG/ML SOLN  COMPARISON:  Cerebral angiogram 03/14/2014. Brain MRI and MRA 03/08/2014, and earlier.  FINDINGS: Expected evolution of the right deep gray matter intra-axial hemorrhage since 02/28/2014. Patchy and confluent bilateral cerebral white matter hypodensity.  No significant intracranial mass effect. No ventriculomegaly. No new intracranial hemorrhage identified. No suspicious enhancement, there is mild rim enhancement of the residual intra-axial hematoma. There is progressive hypodensity tracking from the posterior limb of the left internal capsule through to the left pons. 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  No acute osseous abnormality identified. Visualized paranasal sinuses and mastoids are clear. Visualized orbits and scalp soft tissues are within normal limits.  VASCULAR FINDINGS:  ARTERIAL  Codominant distal vertebral arteries are normal. Normal vertebrobasilar junction.  Normal left PICA and dominant right AICA origins. No basilar artery stenosis. SCA and PCA origins are within normal limits. Posterior communicating arteries are diminutive or absent. Bilateral PCA branches are within normal limits.  Patent ICA siphons.  No siphon stenosis.  Patent right ICA terminus.  Right ACA origin is normal. There is moderate stenosis at the right MCA origin. There is mild right M1 segment irregularity. No major right MCA branch stenosis or occlusion identified.  Patent left ICA terminus and left MCA origin but near complete occlusion of the left ACA origin. Enhancement of a thread-like 81 segment. Anterior communicating artery is present. Bilateral 8 to ACA segments are within normal limits. More distal bilateral ACA branches are within normal limits.  Patent but irregular left MCA origin and left M1 segments with up to moderate M1 segment stenosis (series 5011, image 10). Left MCA bifurcation remains patent. No major left MCA branch stenosis or occlusion identified.  VENOUS  Additional delayed CT V images were obtained (series 601 and series 602). These demonstrate a diminutive but patent superior sagittal sinus. Patent torcula. Dominant right transverse sinus which is patent. Dominant right sigmoid sinus and right IJ bulb which are patent. Non dominant but patent appearing left transverse and sigmoid sinuses. Patent straight sinus, vein of Galen, internal cerebral veins, and left greater than right basal veins of Rosenthal.  Review of the MIP images confirms the above findings.  IMPRESSION: 1. Circle of Willis appearance stable to the MRA on 03/08/2014, with moderate anterior circulation stenoses of the left M1 segment, right MCA origin, and near occlusion of the left ACA origin. The left A2 is adequately reconstituted by the anterior communicating artery. Negative posterior arterial circulation. 2. CT venogram with no evidence of venous sinus thrombosis. 3. Expected evolution of right deep  gray matter hemorrhage. 4. Progressed hypodensity tracking from the left internal capsule to the brainstem, favor Wallerian degeneration.   Electronically Signed   By: Augusto Gamble M.D.   On: 03/16/2014 16:24   . amLODipine  10 mg Per Tube QHS  . antiseptic oral rinse  7 mL Mouth Rinse QID  . [START ON 03/20/2014] cefUROXime (ZINACEF)  IV  1.5 g Intravenous On Call to OR  . chlorhexidine  15 mL Mouth Rinse BID  . darbepoetin (ARANESP) injection - DIALYSIS  100 mcg Intravenous Q Mon-HD  . feeding supplement (NEPRO CARB STEADY)  1,000 mL Per Tube Q24H  . ferric gluconate (FERRLECIT/NULECIT) IV  125 mg Intravenous Q M,W,F-HD  . guaiFENesin  10 mL Per Tube TID  . heparin subcutaneous  5,000 Units Subcutaneous 3 times per day  . insulin aspart  0-15 Units Subcutaneous 6 times per day  . metoprolol tartrate  50 mg Per Tube BID  . multivitamin  5 mL Per Tube Daily  . pantoprazole sodium  40 mg Per Tube Q1200  . scopolamine  1 patch Transdermal Q72H    BMET    Component Value Date/Time   NA 141  03/18/2014 0603   K 4.3 03/18/2014 0603   CL 96 03/18/2014 0603   CO2 26 03/18/2014 0603   GLUCOSE 115* 03/18/2014 0603   BUN 37* 03/18/2014 0603   CREATININE 6.31* 03/18/2014 0603   CALCIUM 10.2 03/18/2014 0603   GFRNONAA 7* 03/18/2014 0603   GFRAA 8* 03/18/2014 0603   CBC    Component Value Date/Time   WBC 9.9 03/17/2014 0800   RBC 3.48* 03/17/2014 0800   HGB 9.9* 03/17/2014 0800   HCT 32.3* 03/17/2014 0800   PLT 359 03/17/2014 0800   MCV 92.8 03/17/2014 0800   MCH 28.4 03/17/2014 0800   MCHC 30.7 03/17/2014 0800   RDW 20.2* 03/17/2014 0800   LYMPHSABS 1.3 03/08/2014 0445   MONOABS 1.3* 03/08/2014 0445   EOSABS 0.2 03/08/2014 0445   BASOSABS 0.1 03/08/2014 0445    Assessment/Plan: 1. ESRD: MWF. Volume improved with UF 3.7L.Do not feel has reversibility 2. Vascular access: plan for permcath on 11/2 Allows time with less risk of temp cath. 3. 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 VDRF: will try to get additional fluid off with HD to assist with resp status 6. CVA: no improvement, vegetative state currently. 7. HTN lowering meds as lower vol 8. Poor QOL, not likely to get off HD, and nonreversible neuro deficits.  ??? plan  [name] 10:25 AM

## 2014-03-19 LAB — RENAL FUNCTION PANEL
ANION GAP: 22 — AB (ref 5–15)
Albumin: 3.4 g/dL — ABNORMAL LOW (ref 3.5–5.2)
BUN: 65 mg/dL — ABNORMAL HIGH (ref 6–23)
CHLORIDE: 91 meq/L — AB (ref 96–112)
CO2: 24 meq/L (ref 19–32)
Calcium: 10 mg/dL (ref 8.4–10.5)
Creatinine, Ser: 9.21 mg/dL — ABNORMAL HIGH (ref 0.50–1.10)
GFR calc non Af Amer: 5 mL/min — ABNORMAL LOW (ref >90)
GFR, EST AFRICAN AMERICAN: 5 mL/min — AB (ref >90)
GLUCOSE: 112 mg/dL — AB (ref 70–99)
Phosphorus: 9.5 mg/dL — ABNORMAL HIGH (ref 2.3–4.6)
Potassium: 4.5 mEq/L (ref 3.7–5.3)
SODIUM: 137 meq/L (ref 137–147)

## 2014-03-19 LAB — GLUCOSE, CAPILLARY
GLUCOSE-CAPILLARY: 128 mg/dL — AB (ref 70–99)
Glucose-Capillary: 120 mg/dL — ABNORMAL HIGH (ref 70–99)
Glucose-Capillary: 122 mg/dL — ABNORMAL HIGH (ref 70–99)
Glucose-Capillary: 129 mg/dL — ABNORMAL HIGH (ref 70–99)

## 2014-03-19 MED ORDER — METOPROLOL TARTRATE 25 MG/10 ML ORAL SUSPENSION
12.5000 mg | Freq: Two times a day (BID) | ORAL | Status: DC
  Administered 2014-03-19 – 2014-03-21 (×5): 12.5 mg
  Filled 2014-03-19 (×6): qty 5

## 2014-03-19 MED ORDER — DARBEPOETIN ALFA 100 MCG/0.5ML IJ SOSY
100.0000 ug | PREFILLED_SYRINGE | INTRAMUSCULAR | Status: DC
  Administered 2014-03-20: 100 ug via INTRAVENOUS
  Filled 2014-03-19: qty 0.5

## 2014-03-19 NOTE — Progress Notes (Signed)
Subjective: Interval History: none.  Objective: Vital signs in last 24 hours: Temp:  [98.2 F (36.8 C)-100 F (37.8 C)] 100 F (37.8 C) (11/01 0356) Pulse Rate:  [71-87] 87 (11/01 0739) Resp:  [15-25] 18 (11/01 0739) BP: (99-126)/(59-78) 105/65 mmHg (11/01 0739) SpO2:  [99 %-100 %] 100 % (11/01 0400) FiO2 (%):  [40 %] 40 % (11/01 0739) Weight:  [103.3 kg (227 lb 11.8 oz)] 103.3 kg (227 lb 11.8 oz) (11/01 0356) Weight change: 0.3 kg (10.6 oz)  Intake/Output from previous day:   Intake/Output this shift:    General appearance: opens eyes only, moves L hand Neck: L IJ cath perm cath Resp: rhonchi bilaterally Cardio: S1, S2 normal and systolic murmur: holosystolic 2/6, blowing at apex GI: pos bs, liver down 6 cm, PEG Extremities: edema 1+  Lab Results:  Recent Labs  03/17/14 0800  WBC 9.9  HGB 9.9*  HCT 32.3*  PLT 359   BMET:  Recent Labs  03/18/14 0603 03/19/14 0637  NA 141 137  K 4.3 4.5  CL 96 91*  CO2 26 24  GLUCOSE 115* 112*  BUN 37* 65*  CREATININE 6.31* 9.21*  CALCIUM 10.2 10.0   No results for input(s): PTH in the last 72 hours. Iron Studies: No results for input(s): IRON, TIBC, TRANSFERRIN, FERRITIN in the last 72 hours.  Studies/Results: No results found.  I have reviewed the patient's current medications.  Assessment/Plan: 1  CKD5/ESRD  Not likely recovery.  Not candidate long term.  Has PC not temp cath.  HD in am 2 HTN lower now can stop meds 3 Fever per primary service 4 Obesity 5 Anemia stable 6 HPTH 7 CVA poor outlook P HD in am, Vit D , epo   LOS: 26 days   Lynn Gentry L 03/19/2014,8:02 AM

## 2014-03-19 NOTE — Progress Notes (Signed)
PULMONARY / CRITICAL CARE MEDICINE   Name: Lynn NationsStephanie Gentry MRN: [id number] DOB: 1971-02-12    ADMISSION DATE:  02/21/2014 CONSULTATION DATE:  03/19/2014  REFERRING MD :  Pearlean BrownieSethi  CHIEF COMPLAINT:  Respiratory failure in setting of ICH  INITIAL PRESENTATION: 43 y.o. F [facility] with left sided weakness, slurred speech, inability to stand up.  In ED, found to have acute lateral right basal ganglia ICH with mild mass effect and 5mm MLS.  Admitted to neuro ICU and developed acute respiratory failure, failed bipap and PCCM called for intubation and management. She was admitted to neuro ICU and later that evening had increasing O2 demands.  She had no improvement in SpO2, work of breathing, and mental status despite 3 hours of BiPAP.  PCCM was consulted for intubation.  STUDIES:  10/06 CT head:  acute hemorrhage into the lateral right basal ganglia, likely indicating a hypertensive hemorrhage/hemorrhagic stroke.  Mild mass effect with 5mm MLS to the left. 10/07 Renal US:  echogenic kidneys s/w ckd, no hydro.  [facility]07 Doppler carotid: consistent with 1-39% stenosis involving the R internal carotid art and the L internal carotid art 10/07 TTE: LVEF 60-65%. Severe concentric LVH. LA dilatation. PASP est 43 mmHg [facility]09 LE venous Dopplers (ordered by Stroke) >> negative 10/21 MRI / MRA brain >> stable subacute intraparenchymal hemorrhage, localized vasogenic edema, mild mass effect, 4 mm R to L shift, acute L MCA infarct, largely watershed in distribution, additional small infarcts in deep white matter  SIGNIFICANT EVENTS: 10/06  Admitted for ICH, developed respiratory failure requiring intubation. 10/07  Persistent hypertension. Remained on nicardipine gtt. Persistent volume overload snd pulm edema pattern on CXR. Lasix gtt resulted in minimal increase in Uo 10/08  Worsening renal function, oliguria. Renal consult. HD  performed 10/09  Acute change in neuro status. [facility] CT [facility]  Further HD planned 10/14  HD with negative fluid balance 10/15  Trach (JY) 10/16  Febrile and purulent matter from nasal cavity and trach. 10/17  Transition from fentanyl gtt to Duragesic patch. Begin PSV weaning 10/18  Transfer to vent SDU bed ordered. Tolerating PSV 10 cm H2O 10/19  Transfer to SDU/2900 10/21  Off vent 36hours, but went back on in evening for secretions/tachypnea; Persistent diarrhea, d/c'd senokot, [place] dis[place] 10/22  Mucus plugging this AM; Neurology had discussion with family explaining dismal prognosis, family desires full code 10/23- active pall care discussions 10/25- pall care meeting not much progress  SUBJECTIVE nad on vent  VITAL SIGNS: Temp:  [98.2 F (36.8 C)-100 F (37.8 C)] 99.7 F (37.6 C) (11/01 0802) Pulse Rate:  [71-90] 90 (11/01 0919) Resp:  [15-25] 18 (11/01 0739) BP: (99-126)/(59-78) 113/78 mmHg (11/01 0919) SpO2:  [99 %-100 %] 100 % (11/01 0400) FiO2 (%):  [40 %] 40 % (11/01 0739) Weight:  [227 lb 11.8 oz (103.3 kg)] 227 lb 11.8 oz (103.3 kg) (11/01 0356)  VENTILATOR SETTINGS: Vent Mode:  [-] PSV;CPAP FiO2 (%):  [40 %] 40 % Set Rate:  [15 bmp] 15 bmp Vt Set:  [500 mL] 500 mL PEEP:  [5 cmH20] 5 cmH20 Pressure Support:  [10 cmH20-12 cmH20] 10 cmH20 Plateau Pressure:  [16 cmH20-20 cmH20] 20 cmH20  INTAKE / OUTPUT: Intake/Output      10/31 0701 - 11/01 0700 11/01 0701 - 11/02 0700   NG/GT     IV Piggyback  Total Intake(mL/kg)     Other     Total Output       Net             PHYSICAL EXAMINATION:  Gen: min opens eyes to verbal, no eye contact HEENT: Trach site clean PULM: ronchi mild diffuse CV: distant tones RRR, no mgr AB: BS+, soft, nontender, PEG Ext: 2 plus  edema Neuro: opens eyes to voice, no movement of extremities on my exam, no changes  LABS:   Recent Labs Lab 03/17/14 0525  03/18/14 0603 03/19/14 0637  NA 141 141 137  K 3.9 4.3 4.5  CL 97 96 91*  CO2 25 26 24   BUN 47* 37* 65*  CREATININE 7.82* 6.31* 9.21*  GLUCOSE 119* 115* 112*    Recent Labs Lab 03/15/14 0405 03/16/14 0500 03/17/14 0800  HGB 9.3* 10.1* 9.9*  HCT 30.7* 33.2* 32.3*  WBC 8.6 10.5 9.9  PLT 346 333 359     Lab Results  Component Value Date   TSH 4.820* 03/13/2014     Lab Results  Component Value Date   PROBNP 36455.0* 02/21/2014     No results found for: ESRSEDRATE      ASSESSMENT / PLAN:  PULMONARY ETT 10/6 >> 10/15 Trach tube 10/15 >>  A: Acute hypoxemic respiratory failure > nearly completely off vent support HCAP> MSSA Mucus plugging 10/22 Edema component P:   Guaifenesin Chest PT as able Unable to wean, needs vent SNF. Cont vent bundle Neg balance on HD  CARDIOVASCULAR CVL R IJ 10/6 >>  A:  HTN persistent Diastolic CHF > currently mild volume overload P:  Unable to remove cvl due to poor IV access, and no PICC given renal function. Continue metaprolol  PRN hydralazine to maintain SBP < 170 mmHg PRN metoprolol to maintain HR < 115/min  RENAL L IJ HD cath 10/08 >> 10/20 Perm Cath L [place] 10/20 >> A:   Acute renal failure> now ESRD  P:   kvo    GASTROINTESTINAL A:   Obesity P:   SUP: Pantoprazole Cont TFs tolerated well  HEMATOLOGIC A:   ICU acquired anemia P:  DVT px: SQ heparin Monitor CBC intermittently and after neg balance hemoconcetration Nephrology considering feraheme Transfuse per usual ICU guidelines  INFECTIOUS A:   HCAP > MSSA P:   Resp 10/17 >> Abundant  MSSA Blood 10/16 >> ng C.diff 10/20 >> neg  Fluconazole 10/15 >> 10/17 Vancomycin 10/16 >> 10/22 Zosyn 10/16 >> 10/20 Ancef 10/21 >> MWF with HD    ENDOCRINE A:   Hyperglycemia, controlled P:   CBG's q4hr Cont SSI  NEUROLOGIC A:   Hypertensive R basal Ganglion Hemorrhage  Acute L MCA stroke Acute Encephalopathy due to strokes > overall prognosis  is grim with little chance of meaningful neurologic recovery  P:   Cont PRN fentanyl  D/w pall care, family NOT realistic at all    FAMILY: 11/1  no family bedside.     ?ethics of continuing HD if futile to a meaningful recovery - will discuss with PCCM team     Sandrea Hughs, MD Pulmonary and Critical Care Medicine [facility] Cell [phone] After 5:30 PM or weekends, call [phone]

## 2014-03-20 ENCOUNTER — Encounter (HOSPITAL_COMMUNITY)
Admission: EM | Disposition: A | Discharge status: Nursing Home (Includes ICF) | Payer: Self-pay | Source: Home / Self Care | Attending: Pulmonary Disease

## 2014-03-20 DIAGNOSIS — Z515 Encounter for palliative care: Secondary | ICD-10-CM | POA: Insufficient documentation

## 2014-03-20 DIAGNOSIS — I610 Nontraumatic intracerebral hemorrhage in hemisphere, subcortical: Secondary | ICD-10-CM | POA: Insufficient documentation

## 2014-03-20 DIAGNOSIS — J398 Other specified diseases of upper respiratory tract: Secondary | ICD-10-CM | POA: Insufficient documentation

## 2014-03-20 DIAGNOSIS — R40243 Glasgow coma scale score 3-8: Secondary | ICD-10-CM

## 2014-03-20 LAB — RENAL FUNCTION PANEL
ANION GAP: 25 — AB (ref 5–15)
Albumin: 3.5 g/dL (ref 3.5–5.2)
BUN: 93 mg/dL — ABNORMAL HIGH (ref 6–23)
CO2: 23 meq/L (ref 19–32)
Calcium: 10 mg/dL (ref 8.4–10.5)
Chloride: 90 mEq/L — ABNORMAL LOW (ref 96–112)
Creatinine, Ser: 11.27 mg/dL — ABNORMAL HIGH (ref 0.50–1.10)
GFR calc non Af Amer: 4 mL/min — ABNORMAL LOW (ref >90)
GFR, EST AFRICAN AMERICAN: 4 mL/min — AB (ref >90)
GLUCOSE: 116 mg/dL — AB (ref 70–99)
POTASSIUM: 5.1 meq/L (ref 3.7–5.3)
Phosphorus: 13.1 mg/dL — ABNORMAL HIGH (ref 2.3–4.6)
SODIUM: 138 meq/L (ref 137–147)

## 2014-03-20 LAB — CBC
HCT: 35.6 % — ABNORMAL LOW (ref 36.0–46.0)
HEMOGLOBIN: 11.1 g/dL — AB (ref 12.0–15.0)
MCH: 28.5 pg (ref 26.0–34.0)
MCHC: 31.2 g/dL (ref 30.0–36.0)
MCV: 91.5 fL (ref 78.0–100.0)
Platelets: 316 10*3/uL (ref 150–400)
RBC: 3.89 MIL/uL (ref 3.87–5.11)
RDW: 20.7 % — ABNORMAL HIGH (ref 11.5–15.5)
WBC: 9.7 10*3/uL (ref 4.0–10.5)

## 2014-03-20 LAB — GLUCOSE, CAPILLARY
GLUCOSE-CAPILLARY: 106 mg/dL — AB (ref 70–99)
GLUCOSE-CAPILLARY: 113 mg/dL — AB (ref 70–99)
GLUCOSE-CAPILLARY: 127 mg/dL — AB (ref 70–99)
GLUCOSE-CAPILLARY: 133 mg/dL — AB (ref 70–99)
GLUCOSE-CAPILLARY: 136 mg/dL — AB (ref 70–99)

## 2014-03-20 SURGERY — INSERTION OF DIALYSIS CATHETER
Anesthesia: General

## 2014-03-20 MED ORDER — NEPRO/CARBSTEADY PO LIQD
1000.0000 mL | ORAL | Status: DC
  Filled 2014-03-20 (×2): qty 1000

## 2014-03-20 MED ORDER — NEPRO/CARBSTEADY PO LIQD
1000.0000 mL | ORAL | Status: DC
  Administered 2014-03-20: 1000 mL
  Filled 2014-03-20 (×3): qty 1000

## 2014-03-20 MED ORDER — DARBEPOETIN ALFA 100 MCG/0.5ML IJ SOSY
PREFILLED_SYRINGE | INTRAMUSCULAR | Status: AC
  Administered 2014-03-20: 100 ug via INTRAVENOUS
  Filled 2014-03-20: qty 0.5

## 2014-03-20 NOTE — Progress Notes (Signed)
Trach Team Patient Details Name: Lynn Gentry MRN: [id number] DOB: 1970/11/03 Today's Date: 03/20/2014 Time:  -      Following pt. As part of trach team.  She is on trach collar.  No attempts to express thoughts with mouthing words, decreased alertness.  PMSV has additional benefits in addition to phonation although prognosis grim per MD ,Palliative care involved.  She does not appear appropriate for PMSV.  If conditions improves, please consult ST if desired.    Breck Coons Hale.Ed [employer] [phone]

## 2014-03-20 NOTE — Procedures (Signed)
I have personally attended this patient's dialysis session.   TDC 340 running reversed. No heparin  2K 2Ca bath (K 5.1, Ca 10) 4 hour TMT planned Phos 13 can't give non-ca based binders through tube  Camille Bal, MD [facility] [phone] Pager 03/20/2014, 12:05 PM

## 2014-03-20 NOTE — Progress Notes (Signed)
Pt is currently weaning off the ventilator. Pt has successfully weaned all day per day RT.

## 2014-03-20 NOTE — Progress Notes (Signed)
Subjective: Interval History: none.  Objective: Vital signs in last 24 hours: Temp:  [98 F (36.7 C)-99.4 F (37.4 C)] 98.2 F (36.8 C) (11/02 0850) Pulse Rate:  [82-124] 105 (11/02 1130) Resp:  [20-39] 39 (11/02 1130) BP: (94-134)/(61-96) 129/90 mmHg (11/02 1130) SpO2:  [66 %-100 %] 99 % (11/02 1130) FiO2 (%):  [28 %-40 %] 28 % (11/02 0902) Weight:  [100.2 kg (220 lb 14.4 oz)] 100.2 kg (220 lb 14.4 oz) (11/02 0333) Weight change: -3.1 kg (-6 lb 13.4 oz)  Intake/Output from previous day: 11/01 0701 - 11/02 0700 In: 1760 [NG/GT:1760] Out: -  Intake/Output this shift:   Physical Examination - seen on HD Eyes open, wandering Not tracking or following any commands Left gaze pref On trach collar No spontaneous or purposeful movement noted Decorticate posture Lungs grossly clear Abd feeding tube No obv tenderness TDC currently in use for HD No edema LE's  Lab Results:  Recent Labs  03/20/14 0600  WBC 9.7  HGB 11.1*  HCT 35.6*  PLT 316   BMET:   Recent Labs  03/19/14 0637 03/20/14 0600  NA 137 138  K 4.5 5.1  CL 91* 90*  CO2 24 23  GLUCOSE 112* 116*  BUN 65* 93*  CREATININE 9.21* 11.27*  CALCIUM 10.0 10.0  Phosphorus                          13 Alb                                          3.5 Results for Kathlee NationsJOHNSON, Venise (MRN [id number]) as of 03/20/2014 11:57  Ref. Range 02/23/2014 11:30  PTH Latest Range: 14-64 pg/mL 731 (H)   Scheduled Medications . antiseptic oral rinse  7 mL Mouth Rinse QID  . chlorhexidine  15 mL Mouth Rinse BID  . darbepoetin (ARANESP) injection - DIALYSIS  100 mcg Intravenous Q Mon-HD  . feeding supplement (NEPRO CARB STEADY)  1,000 mL Per Tube Q24H  . ferric gluconate (FERRLECIT/NULECIT) IV  125 mg Intravenous Q M,W,F-HD  . heparin subcutaneous  5,000 Units Subcutaneous 3 times per day  . metoprolol tartrate  12.5 mg Per Tube BID  . multivitamin  5 mL Per Tube Daily  . pantoprazole sodium  40 mg Per Tube Q1200   . sodium  chloride 10 mL/hr at 03/20/14 [id number]   Background: 43 [facility] acute stroke/intracranial hemorrhage  in the setting of marked hypertension, AKI on CKD, metabolic acidosis, respiratory failure (now off vent/on trach collar) and has remained dialysis dependent since initiation of HD 02/23/14. Has TDC that was placed 03/07/14.  Extensive neurologic workup done, and as of last neuro note of 03/16/14 neurologic prognosis is very poor and is felt patient may be headed toward persistent vegetative state. To date pt remains full code and on dialysis as family still wants everything done.  Palliative Care is following.     Assessment/Plan:  CKD5/ESRD  Not likely recovery.  Not candidate long term.  Has tunnelled HD cath placed 10/20. Prognosis for neuro recovery remains poor. Continued dialysis only because family wants all done.    HTN just on low dose BB  Obesity  Anemia stable On Aranesp and getting Fe load.  Secondary PTH - no Vit D for elev PTH d/t phos of 13. No binders  as no route for adm of non-ca based binders that would occlude feeding tube  CVA poor outlook - see neuro note. Palliative seeing.    LOS: 27 days   Garrette Caine B 03/20/2014,11:48 AM

## 2014-03-20 NOTE — Progress Notes (Addendum)
NUTRITION FOLLOW UP  Intervention:    Increase Nepro formula by 10 ml every 4 hours to goal rate of 50 ml/hr to provide 2160 kcals, 97 protein, 872 ml of free water RD to follow for nutrition care plan  Nutrition Dx:   Inadequate oral intake related to inability to eat as evidenced by NPO status, ongoing  New Goal:   Pt to meet >/= 90% of their estimated nutrition needs, currently unmet  Monitor:   TF regimen & tolerance, respiratory status, weight, labs, I/O's  Assessment:   Pt admitted 10/6 with left sided weakness, slurred speech, inability to stand up. In ED, found to have acute lateral right basal ganglia ICH with mild mass effect and 5mm MLS. Pt developed respiratory failure and was intubated.   Patient s/p procedures 10/15: BEDSIDE TRACHEOSTOMY  PERCUTANEOUS ENDOSCOPIC GASTROSTOMY (PEG) PLACEMENT  Pt transferred from 2H[facility] to 2C-Stepdown 10/28.  Pt currently on ATC.  Continues on HD, however, not a candidate long term.     Current TF regimen: Nepro formula at 20 ml/hr via PEG tube providing 864 kcals, 39 gm protein, 349 ml of free water.  Previously receiving Prostat liquid protein 5 times daily (currently not active).  Also, receiving liquid MVI daily.  Noted Neuro recovery remains poor; family still desiring full code/supportive care.  Height: Ht Readings from Last 1 Encounters:  03/20/14 5\' 4"  (1.626 m)    Weight Status:   Wt Readings from Last 1 Encounters:  03/20/14 220 lb 14.4 oz (100.2 kg)   03/08/14  225 lb 15.5 oz (102.5 kg)    03/03/14  253 lb 4.9 oz (114.9 kg)   Admission weight: 276 lb (125.5 kg) 10/6  Re-estimated needs:  Kcal: 2000-2200 Protein: 100-110 gm Fluid: per MD  Skin: Intact  Diet Order: NPO   Intake/Output Summary (Last 24 hours) at 03/20/14 1454 Last data filed at 03/20/14 1254  Gross per 24 hour  Intake   1760 ml  Output   2000 ml  Net   -240 ml    Labs:   Recent Labs Lab 03/15/14 0405 03/16/14 0500   03/18/14 0603 03/19/14 0637 03/20/14 0600  NA 140 138  < > 141 137 138  K 4.1 4.0  < > 4.3 4.5 5.1  CL 96 94*  < > 96 91* 90*  CO2 26 25  < > 26 24 23   BUN 46* 31*  < > 37* 65* 93*  CREATININE 6.14* 5.39*  < > 6.31* 9.21* 11.27*  CALCIUM 9.2 9.7  < > 10.2 10.0 10.0  MG 2.3 2.3  --   --   --   --   PHOS 5.6* 5.0*  < > 5.9* 9.5* 13.1*  GLUCOSE 115* 116*  < > 115* 112* 116*  < > = values in this interval not displayed.  CBG (last 3)   Recent Labs  03/20/14 0355 03/20/14 0803 03/20/14 1325  GLUCAP 106* 133* 127*    Scheduled Meds: . antiseptic oral rinse  7 mL Mouth Rinse QID  . chlorhexidine  15 mL Mouth Rinse BID  . darbepoetin (ARANESP) injection - DIALYSIS  100 mcg Intravenous Q Mon-HD  . feeding supplement (NEPRO CARB STEADY)  1,000 mL Per Tube Q24H  . ferric gluconate (FERRLECIT/NULECIT) IV  125 mg Intravenous Q M,W,F-HD  . heparin subcutaneous  5,000 Units Subcutaneous 3 times per day  . metoprolol tartrate  12.5 mg Per Tube BID  . multivitamin  5 mL Per Tube Daily  .  pantoprazole sodium  40 mg Per Tube Q1200    Continuous Infusions: . sodium chloride 10 mL/hr at 03/20/14 0650    Maureen Chatters, RD, LDN Pager #: [phone] After-Hours Pager #: [phone]

## 2014-03-20 NOTE — Progress Notes (Signed)
PULMONARY / CRITICAL CARE MEDICINE   Name: Kathlee NationsStephanie Ron MRN: [id number] DOB: December 01, 1970    ADMISSION DATE:  02/21/2014 CONSULTATION DATE:  03/20/2014  REFERRING MD :  Pearlean BrownieSethi  CHIEF COMPLAINT:  Respiratory failure in setting of ICH  INITIAL PRESENTATION: 43 y.o. F [facility] with left sided weakness, slurred speech, inability to stand up.  In ED, found to have acute lateral right basal ganglia ICH with mild mass effect and 5mm MLS.  Admitted to neuro ICU and developed acute respiratory failure, failed bipap and PCCM called for intubation and management. She was admitted to neuro ICU and later that evening had increasing O2 demands.  She had no improvement in SpO2, work of breathing, and mental status despite 3 hours of BiPAP.  PCCM was consulted for intubation.  STUDIES:  10/06 CT head:  acute hemorrhage into the lateral right basal ganglia, likely indicating a hypertensive hemorrhage/hemorrhagic stroke.  Mild mass effect with 5mm MLS to the left. 10/07 Renal US:  echogenic kidneys s/w ckd, no hydro.  [facility] 10/07 Doppler carotid: consistent with 1-39% stenosis involving the R internal carotid art and the L internal carotid art 10/07 TTE: LVEF 60-65%. Severe concentric LVH. LA dilatation. PASP est 43 mmHg 10/09 [facility] 10/09 LE venous Dopplers (ordered by Stroke) >> negative 10/21 MRI / MRA brain >> stable subacute intraparenchymal hemorrhage, localized vasogenic edema, mild mass effect, 4 mm R to L shift, acute L MCA infarct, largely watershed in distribution, additional small infarcts in deep white matter  SIGNIFICANT EVENTS: 10/06  Admitted for ICH, developed respiratory failure requiring intubation. 10/07  Persistent hypertension. Remained on nicardipine gtt. Persistent volume overload snd pulm edema pattern on CXR. Lasix gtt resulted in minimal increase in Uo 10/08  Worsening renal function, oliguria. Renal consult. HD  performed 10/09  Acute change in neuro status. [facility]  Further HD planned 10/14  HD with negative fluid balance 10/15  Trach tube placed 10/16  Febrile and purulent matter from nasal cavity and trach. 10/17  Transition from fentanyl gtt to Duragesic patch. Begin PSV weaning 10/18  Transfer to vent SDU bed ordered. Tolerating PSV 10 cm H2O 10/19  Transfer to SDU 10/21  Off vent 36 hours, but went back on in evening for secretions/tachypnea; Persistent diarrhea, d/c'd senokot, Family conversation from [place] explaining dismal prognosis 10/22  Mucus plugging this AM; Neurology had discussion with family explaining dismal prognosis, family desires full code 10/23- active pall care discussions 10/25- pall care meeting: family still desiring full code/aggressive support  SUBJECTIVE NSC. Tolerating ATC  VITAL SIGNS: Temp:  [98 F (36.7 C)-99.4 F (37.4 C)] 98.2 F (36.8 C) (11/02 0850) Pulse Rate:  [82-124] 110 (11/02 1230) Resp:  [20-41] 41 (11/02 1230) BP: (94-147)/(61-96) 147/91 mmHg (11/02 1230) SpO2:  [66 %-100 %] 98 % (11/02 1230) FiO2 (%):  [28 %-40 %] 28 % (11/02 1155) Weight:  [100.2 kg (220 lb 14.4 oz)] 100.2 kg (220 lb 14.4 oz) (11/02 0333)  VENTILATOR SETTINGS: Vent Mode:  [-]  FiO2 (%):  [28 %-40 %] 28 %  INTAKE / OUTPUT: Intake/Output      11/01 0701 - 11/02 0700 11/02 0701 - 11/03 0700   NG/GT 1760    Total Intake(mL/kg) 1760 (17.6)    Net +1760          Stool Occurrence 1 x 1 x    PHYSICAL EXAMINATION:  Gen:  Minimally responsive, not F/C HEENT: Trach site clean PULM: clear anteriorly CV: reg, no M AB: Soft, +BS Ext: trace symmetric edema Neuro: withdraws from pain. Minimal spont movement  LABS: I have reviewed all of today's lab results. Relevant abnormalities are discussed in the A/P section  CXR: NNF  ASSESSMENT / PLAN:  PULMONARY ETT 10/6 >> 10/15 Trach tube 10/15 >>  A: Acute hypoxemic respiratory failure,  resolving Mucus plugging, resolved Pulm edema, resolving Trach tube dependence P:   Cont supp O2  Cont ATC as long as tolerated Cont nebulized BDs Recehck CXR AM 11/03  CARDIOVASCULAR CVL R IJ 10/6 >> 11/02 (ordered) A:  HTN persistent Diastolic CHF > currently mild volume overload P:  Continue scheduled metaprolol  PRN hydralazine to maintain SBP < 170 mmHg PRN metoprolol to maintain HR < 115/min  RENAL L IJ HD cath 10/08 >> 10/20 Perm Cath L [place] 10/20 >>  A:   Acute renal failure> now ESRD  P:   Monitor BMET intermittently Monitor I/Os Correct electrolytes as indicated    GASTROINTESTINAL A:   Obesity Dysphagia G tube dependence P:   SUP: Pantoprazole Cont TFs  HEMATOLOGIC A:   ICU acquired anemia P:  DVT px: SQ heparin Monitor CBC intermittently Transfuse per usual ICU guidelines  INFECTIOUS A:   MSSA HCAP, treated P:   Resp 10/17 >> Abundant  MSSA Blood 10/16 >> neg C.diff 10/20 >> neg Resp 10/22 >> neg  Fluconazole 10/15 >> 10/17 Vancomycin 10/16 >> 10/22 Zosyn 10/16 >> 10/20 Ancef 10/21 >> MWF with HD    ENDOCRINE A:   Hyperglycemia, controlled P:   CBG's q4hr Cont SSI  NEUROLOGIC A:   Hypertensive R basal Ganglion Hemorrhage  Acute L MCA stroke Severe encephalopathy  Poor prognosis P:   DC sedatives Will need high level SNF    FAMILY: 11/02  no family bedside.     Billy Fischer, MD ; [facility] [phone].  After 5:30 PM or weekends, call [phone]

## 2014-03-20 NOTE — Clinical Social Work Note (Addendum)
5pm CSW received call from Palliative Dr who gave a new number for husband- [phone].  CSW attempted to call this number and left voicemail.  1:00pm CSW called patients spouse- cell phone number is no longer in service, CSW left a voicemail at home number.  CSW will continue to follow.  Merlyn Lot, LCSWA Clinical Social Worker [phone]

## 2014-03-20 NOTE — Progress Notes (Signed)
CPT held due to bedside dialysis being in progress at this time.

## 2014-03-20 NOTE — Progress Notes (Addendum)
Patient Lynn Gentry      DOB: 23-Mar-1971      XTA:[id number]   Palliative Medicine Team at [facility] Progress Note    Subjective: No purposeful interactions    Filed Vitals:   03/20/14 0806  BP: 129/86  Pulse: 113  Temp: 98.6 F (37 C)  Resp: 29   Physical exam: GEN: on trach collar, NAD HEENT: sclera anicteric CV: RRR LUNGS: CTAB ABD: soft, ND EXT: no edema Neuro: no purposeful interactions, leftward gaze with nystagmus  CBC    Component Value Date/Time   WBC 9.7 03/20/2014 0600   RBC 3.89 03/20/2014 0600   HGB 11.1* 03/20/2014 0600   HCT 35.6* 03/20/2014 0600   PLT 316 03/20/2014 0600   MCV 91.5 03/20/2014 0600   MCH 28.5 03/20/2014 0600   MCHC 31.2 03/20/2014 0600   RDW 20.7* 03/20/2014 0600   LYMPHSABS 1.3 03/08/2014 0445   MONOABS 1.3* 03/08/2014 0445   EOSABS 0.2 03/08/2014 0445   BASOSABS 0.1 03/08/2014 0445    CMP     Component Value Date/Time   NA 138 03/20/2014 0600   K 5.1 03/20/2014 0600   CL 90* 03/20/2014 0600   CO2 23 03/20/2014 0600   GLUCOSE 116* 03/20/2014 0600   BUN 93* 03/20/2014 0600   CREATININE 11.27* 03/20/2014 0600   CALCIUM 10.0 03/20/2014 0600   PROT 6.7 02/24/2014 0450   ALBUMIN 3.5 03/20/2014 0600   AST 20 02/24/2014 0450   ALT 16 02/24/2014 0450   ALKPHOS 112 02/24/2014 0450   BILITOT 0.6 02/24/2014 0450   GFRNONAA 4* 03/20/2014 0600   GFRAA 4* 03/20/2014 0600      Assessment and plan: 43 yo female with basal ganglia hemorrhagic CVA with resultant severe neurologic injury, vent dependent respiratory failure, renal failure requiring iHD. Grim prognosis for neurologic recovery.   1. Code Status- Full   2. Goals of Care- see previous documentation. No family around this morning. She has been weaned to ATC trach collar which would seem to make LTAC placement locally more likely.  I think questions around potentially inappropriate treatment/futility are most pressing around the question of doing dialysis  long-term.  If we place perma-cath, I would argue that we should not pursue futility policy.  If someone is willing to start process of long-term access it seems likely from past experience that a place like [facility] would accept her and would essentially make exploring futility policy futile (as [facility] would likely accept patient).  I will attempt to meet with family again this afternoon.    3. Symptom management  A. Secretions- scop patch, oral care   Orvis Brill D.O. Palliative Medicine Team at [facility]  Pager: [phone] Team Phone: [phone]  ADDENDUM: Spoke with husband this afternoon.  He is still waiting to hear about insurance approval for [facility].  He actively tries to turn conversation away from things potentially not going well and even one point when I ask if he had discussed what challenges lay ahead for [name], he responded with "you keep pretty busy dont you?".  I don't think any degree of conversation will likely alter their goals anytime soon. They continue to hope for a miracle.    Orvis Brill D.O. Palliative Medicine Team at [facility]  Pager: [phone] Team Phone: [phone]

## 2014-03-20 NOTE — Progress Notes (Addendum)
RT Note: Pt tolerating well on 40% ATC and resting comfortably BBS clear/ diminished. SPO2 98-100%. RR 20's, No distress noted. RT will continue to monitor

## 2014-03-21 DIAGNOSIS — I619 Nontraumatic intracerebral hemorrhage, unspecified: Secondary | ICD-10-CM

## 2014-03-21 DIAGNOSIS — R4020 Unspecified coma: Secondary | ICD-10-CM

## 2014-03-21 LAB — GLUCOSE, CAPILLARY
GLUCOSE-CAPILLARY: 139 mg/dL — AB (ref 70–99)
Glucose-Capillary: 140 mg/dL — ABNORMAL HIGH (ref 70–99)

## 2014-03-21 MED ORDER — DARBEPOETIN ALFA 100 MCG/0.5ML IJ SOSY: 100.0000 ug | PREFILLED_SYRINGE | INTRAMUSCULAR | Status: AC

## 2014-03-21 MED ORDER — METOPROLOL TARTRATE 25 MG/10 ML ORAL SUSPENSION
12.5000 mg | Freq: Two times a day (BID) | ORAL | Status: AC
Start: 2014-03-21 — End: ?

## 2014-03-21 MED ORDER — PANTOPRAZOLE SODIUM 40 MG PO PACK: 40.0000 mg | PACK | Freq: Every day | ORAL | Status: AC

## 2014-03-21 MED ORDER — SODIUM CHLORIDE 0.9 % IV SOLN
125.0000 mg | INTRAVENOUS | Status: AC
Start: 2014-03-21 — End: ?

## 2014-03-21 MED ORDER — NEPRO/CARBSTEADY PO LIQD: 1000.0000 mL | ORAL | Status: AC

## 2014-03-21 MED ORDER — ADULT MULTIVITAMIN LIQUID CH
5.0000 mL | Freq: Every day | ORAL | Status: AC
Start: 2014-03-21 — End: ?

## 2014-03-21 MED ORDER — HYDRALAZINE HCL 20 MG/ML IJ SOLN: 10.0000 mg | INTRAMUSCULAR | Status: AC | PRN

## 2014-03-21 MED ORDER — IPRATROPIUM-ALBUTEROL 0.5-2.5 (3) MG/3ML IN SOLN: 3.0000 mL | RESPIRATORY_TRACT | Status: AC | PRN

## 2014-03-21 MED ORDER — NEPRO/CARBSTEADY PO LIQD: 237.0000 mL | ORAL | Status: AC | PRN

## 2014-03-21 MED ORDER — METOPROLOL TARTRATE 1 MG/ML IV SOLN: 2.5000 mg | INTRAVENOUS | Status: AC | PRN

## 2014-03-21 NOTE — Progress Notes (Signed)
Placed pt back on full support due to work of breathing and RR greater than 30 breaths per minute.

## 2014-03-21 NOTE — Progress Notes (Signed)
Pt is being transferred to [facility] by [facility]. Pt was bathed. Called report to [facility] nurse Orson Eva RN. Pt going to room 207. Pt's family aware. Called central telemetry and notified Natalie.

## 2014-03-21 NOTE — Clinical Social Work Note (Signed)
CSW spoke with patients husband this morning concerning out of state SNF placement to accommodate patients Vent and dialysis.  Patients husband was hesitant to go out of state because he is taking care of his mother (who is ill and lives in [place]), raise his son, and be near his wife during this time.  CSW provided active listening and education about the Vent SNF availability and patients husband became agreeable to Vent SNF search to see what is available.  CSW received call from patients husband approximately an hour later and he stated that a hospital employee had informed his mom that the patient had a bed approved at [facility].  CSW confirmed with RN Case Manager.   CSW informed patient husband that information was confirmed.  Patient husband is very grateful for everyone's efforts to get this approved and feels much better knowing his wife can remain in [place] where he can visit her.  Full psychosocial to follow.  CSW will continue to be available for family needs while moving forward with the LTACH placement.  Merlyn Lot, LCSWA Clinical Social Worker [phone]

## 2014-03-21 NOTE — Significant Event (Signed)
Note: Lynn Gentry is going to LTAC due to her higher level of care needs not a SNF. Brett Canales Minor ACNP [facility] PCCM Pager [phone] till 3 pm If no answer page [phone] 03/21/2014, 1:21 PM

## 2014-03-21 NOTE — Discharge Summary (Signed)
Physician Discharge Summary  Patient ID: Lynn Gentry MRN: [id number] DOB/AGE: 43/10/1970 43 y.o.  Admit date: 02/21/2014 Discharge date: 03/21/2014  Problem List Active Problems:   Stroke   ICH (intracerebral hemorrhage)   Acute respiratory failure with hypoxia   Acute pulmonary edema   Encephalopathy acute   Hypertensive emergency   Tracheostomy status   Increased tracheal secretions   Nontraumatic subcortical hemorrhage of cerebral hemisphere   Palliative care encounter  HPI: Lynn Gentry is an 43 y.o. female with no previously documented medical disorder who was [facility] with new onset slurred speech and left-sided weakness. Patient was last known well around 3 PM this afternoon. CT scan of her head showed an acute 4.1 x 1.5 cm right basal ganglia hemorrhage with mild mass effect and right to left shift of 5 mm. Patient's blood pressure was markedly elevated at 226/111. She was started on Cardene drip and blood pressure responded well. She has shown continual respiratory difficulty. She was initially given oxygen by nasal cannula followed by NBR, and subsequently placed on BiPAP. She continued to have oxygen saturations only in the high 80s to low 90s on 100% O2, with a respiratory rates in the 40s. ABG showed pH of 2.82, PCO2 37 PO2 of 57. Chest x-ray showed mild cardiomegaly with possible asymmetric pulmonary edema. BNP was 36,455. Because of deteriorating respiratory status intubation and mechanical ventilation was elected. CCM service was consulted.  Hospital Course:   STUDIES:  10/06 CT head: acute hemorrhage into the lateral right basal ganglia, likely indicating a hypertensive hemorrhage/hemorrhagic stroke. Mild mass effect with 5mm MLS to the left. 10/07 Renal US: echogenic kidneys s/w ckd, no hydro.  10/07 CT head: [facility] 10/07 repeat CT head: [facility] 10/07 Doppler carotid: consistent with 1-39% stenosis involving the R internal carotid art and  the L internal carotid art 10/07 TTE: LVEF 60-65%. Severe concentric LVH. LA dilatation. PASP est 43 mmHg 10/09 CT head: [facility] 10/09 LE venous Dopplers (ordered by Stroke) >> negative 10/21 MRI / MRA brain >> stable subacute intraparenchymal hemorrhage, localized vasogenic edema, mild mass effect, 4 mm R to L shift, acute L MCA infarct, largely watershed in distribution, additional small infarcts in deep white matter  SIGNIFICANT EVENTS: 10/06 Admitted for ICH, developed respiratory failure requiring intubation. 10/07 Persistent hypertension. Remained on nicardipine gtt. Persistent volume overload snd pulm edema pattern on CXR. Lasix gtt resulted in minimal increase in Uo 10/08 Worsening renal function, oliguria. Renal consult. HD performed 10/09 Acute change in neuro status. STAT CT head ordered: [facility] 10/09 Further HD planned 10/14 HD with negative fluid balance 10/15 Trach tube placed 10/16 Febrile and purulent matter from nasal cavity and trach. 10/17 Transition from fentanyl gtt to Duragesic patch. Begin PSV weaning 10/18 Transfer to vent SDU bed ordered. Tolerating PSV 10 cm H2O 10/19 Transfer to SDU 10/21 Off vent 36 hours, but went back on in evening for secretions/tachypnea; Persistent diarrhea, d/c'd senokot, Family conversation from [place] explaining dismal prognosis 10/22 Mucus plugging this AM; Neurology had discussion with family explaining dismal prognosis, family desires full code 10/23- active pall care discussions 10/25- pall care meeting: family still desiring full code/aggressive support 11/3 transfer to [facility] hospital.  ASSESSMENT / PLAN:  PULMONARY ETT 10/6 >> 10/15 Trach tube 10/15 >>  A: Acute hypoxemic respiratory failure, resolving Mucus plugging, resolved Pulm edema, resolving Trach tube dependence Vent Mode:  [-] PRVC FiO2 (%):  [28 %-40 %] 40 % Set Rate:  [15 bmp] 15 bmp  Vt Set:  [500 mL] 500 mL PEEP:  [5 cmH20] 5 cmH20 Plateau Pressure:   [19 cmH20-20 cmH20] 19 cmH20 P:  Cont supp O2  Cont ATC as long as tolerated use above vent settings as needed. Cont nebulized BDs Recehck CXR as needed  CARDIOVASCULAR CVL R IJ 10/6 >> 11/02 (ordered) A:  HTN persistent Diastolic CHF > currently mild volume overload P:  Continue scheduled metaprolol  PRN hydralazine to maintain SBP < 170 mmHg PRN metoprolol to maintain HR < 115/min  RENAL L IJ HD cath 10/08 >> 10/20 Perm Cath L [place] 10/20 >>  A:  Acute renal failure> now ESRD  P:  Monitor BMET intermittently Monitor I/Os Correct electrolytes as indicated HD per renal service  GASTROINTESTINAL A:  Obesity Dysphagia G tube dependence P:  SUP: Pantoprazole Cont TFs  HEMATOLOGIC A:  ICU acquired anemia P:  DVT px: SQ heparin Monitor CBC intermittently Transfuse per usual ICU guidelines  INFECTIOUS A:  MSSA HCAP, treated P:  Resp 10/17 >> Abundant MSSA Blood 10/16 >> neg C.diff 10/20 >> neg Resp 10/22 >> neg  Fluconazole 10/15 >> 10/17 Vancomycin 10/16 >> 10/22 Zosyn 10/16 >> 10/20 Ancef 10/21 >> MWF with HD   ENDOCRINE CBG (last 3)   Recent Labs  03/20/14 1325 03/21/14 0319 03/21/14 0858  GLUCAP 127* 140* 139*     A:  Hyperglycemia, controlled P:  CBG's q4hr Cont SSI  NEUROLOGIC A:  Hypertensive R basal Ganglion Hemorrhage  Acute L MCA stroke Severe encephalopathy  Poor prognosis P:  DC sedatives Transfer to [facility] 03/21/14   Labs at discharge Lab Results  Component Value Date   CREATININE 11.27* 03/20/2014   BUN 93* 03/20/2014   NA 138 03/20/2014   K 5.1 03/20/2014   CL 90* 03/20/2014   CO2 23 03/20/2014   Lab Results  Component Value Date   WBC 9.7 03/20/2014   HGB 11.1* 03/20/2014   HCT 35.6* 03/20/2014   MCV 91.5 03/20/2014   PLT 316 03/20/2014   Lab Results  Component Value Date   ALT 16 02/24/2014   AST 20 02/24/2014   ALKPHOS 112 02/24/2014   BILITOT 0.6 02/24/2014    Lab Results  Component Value Date   INR 1.15 03/14/2014   INR 1.38 03/06/2014   INR 1.45 03/02/2014    Current radiology studies No results found.  Disposition:  Final discharge disposition not confirmed  Discharge Instructions    Discharge to SNF when bed available    Complete by:  As directed             Medication List    STOP taking these medications        cetirizine 10 MG tablet  Commonly known as:  ZYRTEC      TAKE these medications        Darbepoetin Alfa 100 MCG/0.5ML Sosy injection  Commonly known as:  ARANESP  Inject 0.5 mLs (100 mcg total) into the vein every Monday with hemodialysis.     feeding supplement (NEPRO CARB STEADY) Liqd  Place 1,000 mLs into feeding tube continuous.     feeding supplement (NEPRO CARB STEADY) Liqd  Take 237 mLs by mouth as needed (missed meal during dialysis.).     ferric gluconate 125 mg in sodium chloride 0.9 % 100 mL  Inject 125 mg into the vein every Monday, Wednesday, and Friday with hemodialysis.     hydrALAZINE 20 MG/ML injection  Commonly known as:  APRESOLINE  Inject 0.5-2 mLs (  10-40 mg total) into the vein every 4 (four) hours as needed (To maintain SBP < 170 mmHg).     ipratropium-albuterol 0.5-2.5 (3) MG/3ML Soln  Commonly known as:  DUONEB  Take 3 mLs by nebulization every 4 (four) hours as needed.     metoprolol 1 MG/ML injection  Commonly known as:  LOPRESSOR  Inject 2.5-5 mLs (2.5-5 mg total) into the vein every 3 (three) hours as needed (To maintain HR < 115/min).     metoprolol tartrate 25 mg/10 mL Susp  Commonly known as:  LOPRESSOR  Place 5 mLs (12.5 mg total) into feeding tube 2 (two) times daily.     multivitamin Liqd  Place 5 mLs into feeding tube daily.     pantoprazole sodium 40 mg/20 mL Pack  Commonly known as:  PROTONIX  Place 20 mLs (40 mg total) into feeding tube daily at 12 noon.          Discharged Condition: poor  Time spent on discharge greater than 40  minutes.  Vital signs at Discharge. Temp:  [98.4 F (36.9 C)-100.1 F (37.8 C)] 100.1 F (37.8 C) (11/03 0756) Pulse Rate:  [96-115] 105 (11/03 0800) Resp:  [15-42] 30 (11/03 0800) BP: (118-161)/(75-96) 135/80 mmHg (11/03 0800) SpO2:  [97 %-100 %] 98 % (11/03 0800) FiO2 (%):  [28 %-40 %] 40 % (11/03 0756) Office follow up Special Information or instructions. Per [facility] hospital. Poor neurological prognosis.  Signed: Brett Canales Minor ACNP [facility] PCCM Pager [phone] till 3 pm If no answer page [phone] 03/21/2014, 10:20 AM     Agree with above  Billy Fischer, MD ; [facility] service Mobile [phone].  After 5:30 PM or weekends, call [phone]

## 2014-03-21 NOTE — Progress Notes (Signed)
Subjective: Interval History:  I am told pt to co to LTAC ([facility]) today On vent support during the night due to increased WOB - this AM back on trach collar Had dialysis yesterday - catheter had to run reversed  Objective: Vital signs in last 24 hours: Temp:  [98.4 F (36.9 C)-100.1 F (37.8 C)] 100.1 F (37.8 C) (11/03 0756) Pulse Rate:  [96-122] 105 (11/03 0800) Resp:  [15-42] 30 (11/03 0800) BP: (114-161)/(75-96) 135/80 mmHg (11/03 0800) SpO2:  [97 %-100 %] 98 % (11/03 0800) FiO2 (%):  [28 %-40 %] 40 % (11/03 0756) Weight change:   Intake/Output from previous day: 11/02 0701 - 11/03 0700 In: 220 [NG/GT:220] Out: 2000  Intake/Output this shift:   Physical Examination  Eyes open, wandering  Trach collar No spontaneous or purposeful movement noted Decorticate posturing Lungs grossly clear Abd feeding tube No obv tenderness TDC with dry dressing No edema LE's  Lab Results:  Recent Labs  03/20/14 0600  WBC 9.7  HGB 11.1*  HCT 35.6*  PLT 316   BMET:   Recent Labs  03/19/14 0637 03/20/14 0600  NA 137 138  K 4.5 5.1  CL 91* 90*  CO2 24 23  GLUCOSE 112* 116*  BUN 65* 93*  CREATININE 9.21* 11.27*  CALCIUM 10.0 10.0  Phosphorus                          13 Alb                                          3.5 Results for Lynn, Gentry (MRN [id number]) as of 03/20/2014 11:57  Ref. Range 02/23/2014 11:30  PTH Latest Range: 14-64 pg/mL 731 (H)   Scheduled Medications . antiseptic oral rinse  7 mL Mouth Rinse QID  . chlorhexidine  15 mL Mouth Rinse BID  . darbepoetin (ARANESP) injection - DIALYSIS  100 mcg Intravenous Q Mon-HD  . ferric gluconate (FERRLECIT/NULECIT) IV  125 mg Intravenous Q M,W,F-HD  . heparin subcutaneous  5,000 Units Subcutaneous 3 times per day  . metoprolol tartrate  12.5 mg Per Tube BID  . multivitamin  5 mL Per Tube Daily  . pantoprazole sodium  40 mg Per Tube Q1200   . sodium chloride 10 mL/hr at 03/20/14 0650  . feeding supplement  (NEPRO CARB STEADY) 1,000 mL (03/20/14 1824)   Background: 43 yo AAF transferred from [facility] 10/6 with acute stroke/intracranial hemorrhage  in the setting of marked hypertension, AKI on CKD, metabolic acidosis, respiratory failure (now off vent/on trach collar) and has remained dialysis dependent since initiation of HD 02/23/14. Has TDC that was placed 03/07/14.  Extensive neurologic workup done, and as of last neuro note of 03/16/14 neurologic prognosis is very poor and is felt patient may be headed toward persistent vegetative state. To date pt remains full code and on dialysis as family still wants everything done.  Palliative Care is following.     Assessment/Plan:  [facility]  Not likely recovery.  Not candidate long term.  Has tunnelled HD cath placed 10/20. Prognosis for neuro recovery remains poor. Continued dialysis only because family wants all done.    HTN just on low dose BB  Obesity  Anemia stable On Aranesp and getting Fe load.  Secondary PTH - no Vit D for elev PTH of 731 d/t phos of 13. No  binders as no route for adm of non-ca based binders that would occlude feeding tube  CVA poor outlook - see neuro note. Palliative seeing.  Dispo - to [facility]    LOS: 28 days   Lynn Gentry B 03/21/2014,9:38 AM

## 2014-03-21 NOTE — Plan of Care (Signed)
Problem: Consults Goal: Ventilated Patients Patient Education See Patient Education Module for education specifics.  Outcome: Adequate for Discharge Goal: Skin Care Protocol Initiated - if Braden Score 18 or less If consults are not indicated, leave blank or document N/A  Outcome: Completed/Met Date Met:  03/21/14  Problem: Phase I Progression Outcomes Goal: Progressing towards optiumm acitivities Outcome: Not Applicable Date Met:  37/04/88 Goal: Optimized method of communication. Outcome: Not Applicable Date Met:  89/16/94 Goal: Initial discharge plan identified Outcome: Completed/Met Date Met:  03/21/14  Problem: Phase II Progression Outcomes Goal: Date pt extubated/weaned off vent Outcome: Adequate for Discharge Goal: Time pt extubated/weaned off vent Outcome: Adequate for Discharge Goal: Hemodynamically stable Outcome: Completed/Met Date Met:  03/21/14 Goal: Progress activities as ordered Outcome: Not Applicable Date Met:  50/38/88 Goal: Code status re-addressed Outcome: Completed/Met Date Met:  03/21/14 Goal: Discharge/transfer plan updated Outcome: Completed/Met Date Met:  03/21/14 Goal: Other Phase II Outcomes/Goals Outcome: Completed/Met Date Met:  03/21/14

## 2014-03-22 LAB — GLUCOSE, CAPILLARY: Glucose-Capillary: 134 mg/dL — ABNORMAL HIGH (ref 70–99)

## 2014-03-22 NOTE — Care Management Note (Signed)
    Page 1 of 2   03/22/2014     8:22:54 AM CARE MANAGEMENT NOTE 03/22/2014  Patient:  Lynn Gentry, Lynn Gentry   Account Number:  [id number]  Date Initiated:  03/07/2014  Documentation initiated by:  Elissa Hefty  Subjective/Objective Assessment:   adm w stroke     Action/Plan:   lives w husband   Anticipated DC Date:     Anticipated DC Plan:    In-house referral  Clinical Social Worker  Hospice / [place]  CM consult      Choice offered to / List presented to:             Status of service:  Completed, signed off Medicare Important Message given?  NO (If response is "NO", the following Medicare IM given date fields will be blank) Date Medicare IM given:   Medicare IM given by:   Date Additional Medicare IM given:   Additional Medicare IM given by:    Discharge Disposition:  LONG TERM ACUTE CARE (LTAC)  Per UR Regulation:  Reviewed for med. necessity/level of care/duration of stay  If discussed at [place] of Stay Meetings, dates discussed:   03/09/2014  03/14/2014  03/16/2014  03/21/2014    Comments:  03/21/14 1300 [place] MSN BSN CCM Received TC that insurance has authorized xfer to [facility]. Talked with pt's mother and spouse who agree with transfer.  03/17/14 [place] MSN BSN CCM Per PCCM, LTAC was not approved and pt not weanable, may need vent SNF.  CSW following.  [facility] seeking auth for LTAC bed, documents faxed as requested.  10/28  1215p debbie dowell rn,bsn met w husband to let him know his [employer] ins did not approve ltac. sw will meet w fam to discuss vent-trach-dialysis facility search. will ask dr Nelda Marseille to speak w med advi of [employer] to see if he can appeal decision but md feels he may not be able to convince them to approve ltac. will cont to follow.  10/28  7858 debbie dowell rn,bsn recieved inform from [facility] late on 10/27 they had to wait til pt on vent 21 days before they could start auth. [employer] has denied ltac. will need to let husband know that ltac not option and sw will need to work on vent snf that takes dialysis. sw will try and talk w fam today also. alerted dr Nelda Marseille that ltac has been denied by [employer] ins.  10/26  1421 debbie dowell rn,bsn [facility] following along. they cannot submit for prior auth until pt on vent for 21 days. have also made sw consult in case ins does not approve ltac and vent snf needed.  10/22  1451 debbie dowell rn,bsn dr Milton Ferguson and pal care to see husb. spoke w husb and introduced myself and role of case Freight forwarder. husb hopes that pt can go to [facility] but aware ins will need to be checked for benefits. jenny w [facility] ck on benefits.  10/22  1041a debbie dowell rn,bsn will try and speak w husb when he visits. has trach and peg. on vent at nite. will get ltac to see if any benefits. have made sw consult for poss trach-vent -snf.  10/20 1146a debbie dowell rn,bsn pt transf to [facility] on 10/19. pt has trach and weaning vent. will make sw ref and follow. jayne at [facility] for [employer] [phone]

## 2014-04-18 DEATH — deceased

## 2014-12-21 IMAGING — CT CT ANGIO HEAD
2 of 18 series · 2 of 36 positions shown · IV contrast (Iohexol (Omnipaque 350))
Comparison: Cerebral angiogram 03/14/2014. Brain MRI and MRA
03/08/2014, and earlier.

CLINICAL DATA: 43-year-old female patient on dialysis for less than
6 months with recent right hemisphere hemorrhage and bihemispheric
ischemic strokes. Initial encounter. Query venous sinus thrombosis.

EXAM:
CT ANGIOGRAPHY HEAD
TECHNIQUE: Multidetector CT imaging of the head was performed using the
standard protocol during bolus administration of intravenous
contrast. Multiplanar CT image reconstructions and MIPs were
obtained to evaluate the vascular anatomy.
CONTRAST:  50mL OMNIPAQUE IOHEXOL 350 MG/ML SOLN

[Series 300: locator · axial · 0.49mm/px · 1 of 1 slices shown]
[im 1/1  soft-tissue]
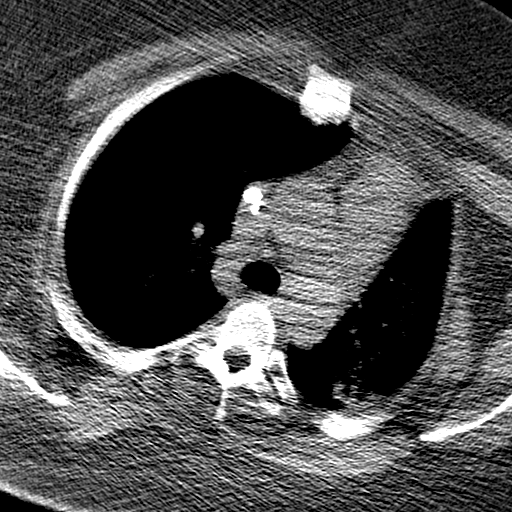

[Series 5014: 1 x 1 sag · sagittal · 0.50mm/px · 1 of 136 slices shown]
[im 6/136  soft-tissue]
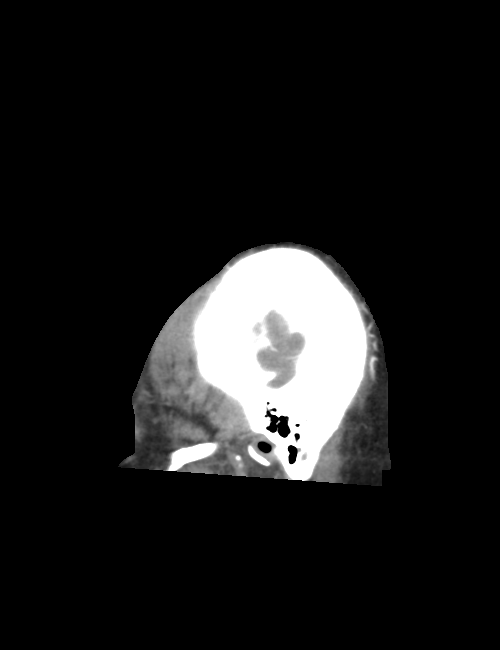

[2 of 36 positions shown; findings below may reference images not displayed]

FINDINGS: Expected evolution of the right deep gray matter intra-axial
hemorrhage since 02/28/2014. Patchy and confluent bilateral cerebral
white matter hypodensity. No significant intracranial mass effect.
No ventriculomegaly. No new intracranial hemorrhage identified. No
suspicious enhancement, there is mild rim enhancement of the
residual intra-axial hematoma. There is progressive hypodensity
tracking from the posterior limb of the left internal capsule
through to the left pons. This might reflect Wallerian degeneration.

No acute osseous abnormality identified. Visualized paranasal
sinuses and mastoids are clear. Visualized orbits and scalp soft
tissues are within normal limits.

VASCULAR FINDINGS:

ARTERIAL

Codominant distal vertebral arteries are normal. Normal
vertebrobasilar junction. Normal left PICA and dominant right AICA
origins. No basilar artery stenosis. SCA and PCA origins are within
normal limits. Posterior communicating arteries are diminutive or
absent. Bilateral PCA branches are within normal limits.

Patent ICA siphons.  No siphon stenosis.  Patent right ICA terminus.

Right ACA origin is normal. There is moderate stenosis at the right
MCA origin. There is mild right M1 segment irregularity. No major
right MCA branch stenosis or occlusion identified.

Patent left ICA terminus and left MCA origin but near complete
occlusion of the left ACA origin. Enhancement of a thread-like 81
segment. Anterior communicating artery is present. Bilateral 8 to
ACA segments are within normal limits. More distal bilateral ACA
branches are within normal limits.

Patent but irregular left MCA origin and left M1 segments with up to
moderate M1 segment stenosis (series 1200, image 10). Left MCA
bifurcation remains patent. No major left MCA branch stenosis or
occlusion identified.

VENOUS

Additional delayed CT V images were obtained (series 601 and series
602). These demonstrate a diminutive but patent superior sagittal
sinus. Patent torcula. Dominant right transverse sinus which is
patent. Dominant right sigmoid sinus and right IJ bulb which are
patent. Non dominant but patent appearing left transverse and
sigmoid sinuses. Patent straight sinus, vein of Neko, internal
cerebral veins, and left greater than right basal veins of
[place].

Review of the MIP images confirms the above findings.
IMPRESSION: 1. Circle of Willis appearance stable to the MRA on 03/08/2014, with
moderate anterior circulation stenoses of the left M1 segment, right
MCA origin, and near occlusion of the left ACA origin. The left A2
is adequately reconstituted by the anterior communicating artery.
Negative posterior arterial circulation.
2. CT venogram with no evidence of venous sinus thrombosis.
3. Expected evolution of right deep gray matter hemorrhage.
4. Progressed hypodensity tracking from the left internal capsule to
the brainstem, favor Wallerian degeneration.
